# Patient Record
Sex: Male | Born: 1935 | Race: White | Hispanic: No | Marital: Married | Smoking: Never smoker
Health system: Southern US, Community
[De-identification: ages and names within clinical notes are randomized; demographics above are authoritative.]

## PROBLEM LIST (undated history)

## (undated) DIAGNOSIS — N400 Enlarged prostate without lower urinary tract symptoms: Secondary | ICD-10-CM

## (undated) DIAGNOSIS — F419 Anxiety disorder, unspecified: Secondary | ICD-10-CM

## (undated) DIAGNOSIS — M1990 Unspecified osteoarthritis, unspecified site: Secondary | ICD-10-CM

## (undated) DIAGNOSIS — H9110 Presbycusis, unspecified ear: Secondary | ICD-10-CM

## (undated) DIAGNOSIS — F0390 Unspecified dementia without behavioral disturbance: Secondary | ICD-10-CM

## (undated) DIAGNOSIS — O2230 Deep phlebothrombosis in pregnancy, unspecified trimester: Secondary | ICD-10-CM

## (undated) HISTORY — PX: PROSTATE SURGERY: SHX751

## (undated) HISTORY — DX: Unspecified osteoarthritis, unspecified site: M19.90

## (undated) HISTORY — DX: Anxiety disorder, unspecified: F41.9

## (undated) HISTORY — PX: EYE SURGERY: SHX253

## (undated) HISTORY — DX: Deep phlebothrombosis in pregnancy, unspecified trimester: O22.30

## (undated) HISTORY — PX: HERNIA REPAIR: SHX51

## (undated) HISTORY — DX: Benign prostatic hyperplasia without lower urinary tract symptoms: N40.0

## (undated) HISTORY — DX: Presbycusis, unspecified ear: H91.10

---
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
## 2001-05-07 LAB — HM COLONOSCOPY: HM Colonoscopy: NORMAL

## 2004-02-22 ENCOUNTER — Inpatient Hospital Stay (HOSPITAL_COMMUNITY): Admission: RE | Admit: 2004-02-22 | Discharge: 2004-02-26 | Payer: Self-pay | Admitting: Orthopedic Surgery

## 2005-04-16 HISTORY — PX: TOTAL HIP ARTHROPLASTY: SHX124

## 2008-01-29 ENCOUNTER — Ambulatory Visit: Payer: Self-pay | Admitting: Ophthalmology

## 2008-02-10 ENCOUNTER — Ambulatory Visit: Payer: Self-pay | Admitting: Ophthalmology

## 2008-03-16 ENCOUNTER — Ambulatory Visit: Payer: Self-pay

## 2010-12-14 ENCOUNTER — Ambulatory Visit: Payer: Self-pay | Admitting: Surgery

## 2010-12-22 ENCOUNTER — Ambulatory Visit: Payer: Self-pay | Admitting: Surgery

## 2012-05-07 ENCOUNTER — Encounter: Payer: Self-pay | Admitting: Internal Medicine

## 2012-05-07 ENCOUNTER — Ambulatory Visit (INDEPENDENT_AMBULATORY_CARE_PROVIDER_SITE_OTHER): Payer: Medicare Other | Admitting: Internal Medicine

## 2012-05-07 VITALS — BP 120/78 | HR 67 | Temp 98.2°F | Resp 16 | Ht 68.5 in | Wt 192.8 lb

## 2012-05-07 DIAGNOSIS — R5383 Other fatigue: Secondary | ICD-10-CM

## 2012-05-07 DIAGNOSIS — K219 Gastro-esophageal reflux disease without esophagitis: Secondary | ICD-10-CM

## 2012-05-07 DIAGNOSIS — R635 Abnormal weight gain: Secondary | ICD-10-CM

## 2012-05-07 DIAGNOSIS — H9110 Presbycusis, unspecified ear: Secondary | ICD-10-CM

## 2012-05-07 DIAGNOSIS — N400 Enlarged prostate without lower urinary tract symptoms: Secondary | ICD-10-CM

## 2012-05-07 DIAGNOSIS — Z13220 Encounter for screening for lipoid disorders: Secondary | ICD-10-CM

## 2012-05-07 LAB — CBC WITH DIFFERENTIAL/PLATELET
Basophils Absolute: 0 10*3/uL (ref 0.0–0.1)
Basophils Relative: 0.4 % (ref 0.0–3.0)
Eosinophils Absolute: 0.2 10*3/uL (ref 0.0–0.7)
Eosinophils Relative: 2 % (ref 0.0–5.0)
HCT: 45.4 % (ref 39.0–52.0)
Hemoglobin: 15.2 g/dL (ref 13.0–17.0)
Lymphocytes Relative: 32 % (ref 12.0–46.0)
Lymphs Abs: 2.7 10*3/uL (ref 0.7–4.0)
MCHC: 33.5 g/dL (ref 30.0–36.0)
MCV: 87 fl (ref 78.0–100.0)
Monocytes Absolute: 0.6 10*3/uL (ref 0.1–1.0)
Monocytes Relative: 6.9 % (ref 3.0–12.0)
Neutro Abs: 4.9 10*3/uL (ref 1.4–7.7)
Neutrophils Relative %: 58.7 % (ref 43.0–77.0)
Platelets: 259 10*3/uL (ref 150.0–400.0)
RBC: 5.22 Mil/uL (ref 4.22–5.81)
RDW: 13.2 % (ref 11.5–14.6)
WBC: 8.4 10*3/uL (ref 4.5–10.5)

## 2012-05-07 LAB — COMPREHENSIVE METABOLIC PANEL
ALT: 17 U/L (ref 0–53)
AST: 22 U/L (ref 0–37)
Albumin: 4.1 g/dL (ref 3.5–5.2)
Alkaline Phosphatase: 63 U/L (ref 39–117)
BUN: 16 mg/dL (ref 6–23)
CO2: 26 mEq/L (ref 19–32)
Calcium: 9.3 mg/dL (ref 8.4–10.5)
Chloride: 106 mEq/L (ref 96–112)
Creatinine, Ser: 1 mg/dL (ref 0.4–1.5)
GFR: 78.57 mL/min (ref >60.00)
Glucose, Bld: 96 mg/dL (ref 70–99)
Potassium: 4.1 mEq/L (ref 3.5–5.1)
Sodium: 140 mEq/L (ref 135–145)
Total Bilirubin: 1.3 mg/dL — ABNORMAL HIGH (ref 0.3–1.2)
Total Protein: 7.7 g/dL (ref 6.0–8.3)

## 2012-05-07 LAB — TSH: TSH: 2.16 u[IU]/mL (ref 0.35–5.50)

## 2012-05-07 MED ORDER — TRAMADOL HCL 50 MG PO TABS: 50.0000 mg | ORAL_TABLET | Freq: Four times a day (QID) | ORAL | Status: DC | PRN

## 2012-05-07 NOTE — Patient Instructions (Addendum)
We will send you your labs results via MyChart   Return in 6 months

## 2012-05-07 NOTE — Progress Notes (Signed)
Patient ID: Corey Todd, male   DOB: 17-Jan-1928, 79 y.o.   MRN: [id number]  Patient Active Problem List  Diagnosis  . Presbyacusis  . BPH (benign prostatic hypertrophy)  . GERD (gastroesophageal reflux disease)  . Screening for hyperlipidemia  . Weight gain    Subjective:  CC:   Chief Complaint  Patient presents with  . Establish Care    HPI:   Corey Todd is a 79 y.o. male who presents as a new patient to establish primary care with the chief complaint of need for primary care. Referred by wife.  He feels generally well and is pain free most of the time .  Enlarged prostate,  No trouble voiding if he stays on the flomax and cardura. Prior prostate biopsy was done for suspicion of CA but eventually reported as normal.  He believes it was healed by God.  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  Still very active in the prayer ministry by the [facility] .  Used to walk 2 miles daily at [facility] but has not done so lately.   Dyspepsia controlled with zantac and behavior modification .  Corey Todd does the cooking and has a careful menu.    Weight is stable,  gained a few lbs over Christmas,  No history  Of hyperlipidemia or  diabetes  Still getting annual PSAs by his PCP Artis Flock [place] Schillings despite age,  Due to BPH and priro threat of prostate CA    Some daytime somnolence but only with passive acitivities.  Does not snore   Chronic constipation,  Uses miralax every 3 days and moves bowels daily .  No blood.  Last colonoscopy over 10 years ago and normal   Chronic hearing loss, wears hearing aid in right ear . Bilateral cataracts surgery 2013,  Annual eye exams by Porfilio  History of lump on left calf treated with abx by henderson tuned out to be varicose vein /phlebitis   Left hand hematoma unprovoked dorsum  Resolved   spontaneosuly    Past Medical History  Diagnosis Date  . Presbyacusis     right sided hearing aid  . BPH (benign prostatic hypertrophy)     Past Surgical History  Procedure Date  . Prostate surgery     biopsy   . Joint replacement 1953    ankle crush injury, right  . Tonsillectomy and adenoidectomy 1936  . Total hip arthroplasty 2007    left , Dr Juliene Pina  . Eye surgery     cataract surgery    Family History  Problem Relation Age of Onset  . Arthritis Mother     History   Social History  . Marital Status: Unknown    Spouse Name: N/A    Number of Children: N/A  . Years of Education: N/A   Occupational History  . Not on file.   Social History Main Topics  . Smoking status: Former Smoker    Quit date: 04/16/1954  . Smokeless tobacco: Not on file  . Alcohol Use: No  . Drug Use: No  . Sexually Active: Not on file   Other Topics Concern  . Not on file   Social History Narrative  . No narrative on file         @ALLHX @    Review of Systems:  Patient denies headache, fevers, malaise, unintentional weight loss, skin rash, eye pain, sinus congestion and sinus pain, sore throat, dysphagia,  hemoptysis , cough, dyspnea, wheezing, chest pain, palpitations, orthopnea, edema, abdominal pain, nausea, melena, diarrhea, constipation, flank pain, dysuria, hematuria, urinary  Frequency, nocturia, numbness, tingling, seizures,  Focal weakness, Loss of consciousness,  Tremor, insomnia, depression, anxiety, and suicidal ideation.     Objective:  BP 120/78  Pulse 67  Temp 98.2 F (36.8 C) (Oral)  Resp 16  Ht 5' 8.5" (1.74 m)  Wt 192 lb 12 oz (87.431 kg)  BMI 28.88 kg/m2  SpO2 93%  General appearance: alert, cooperative and appears stated age Ears: normal TM's and external ear canals both ears Throat: lips, mucosa, and tongue normal; teeth and gums normal Neck: no adenopathy, no carotid bruit, supple, symmetrical, trachea midline and thyroid not enlarged,  symmetric, no tenderness/mass/nodules Back: symmetric, no curvature. ROM normal. No CVA tenderness. Lungs: clear to auscultation bilaterally Heart: regular rate and rhythm, S1, S2 normal, no murmur, click, rub or gallop Abdomen: soft, non-tender; bowel sounds normal; no masses,  no organomegaly Pulses: 2+ and symmetric Skin: Skin color, texture, turgor normal. No rashes or lesions Lymph nodes: Cervical, supraclavicular, and axillary nodes normal.  Assessment and Plan:  BPH (benign prostatic hypertrophy) Patient requests annual PSAs.  Will request records from prior PCP .  Renal function is normal.   GERD (gastroesophageal reflux disease) Controlled with diet, zantac  and behavioral modification   Weight gain I have addressed  BMI and recommended a low glycemic index diet utilizing smaller more frequent meals to increase metabolism.  I have also recommended that patient start exercising with a goal of 30 minutes of aerobic exercise a minimum of 5 days per week. Screening for thyroid and diabetes was negative today  Screening for hyperlipidemia Will check fasting lipids prior to annual exam.    Updated Medication List Outpatient Encounter Prescriptions as of 05/07/2012  Medication Sig Dispense Refill  . aspirin 81 MG tablet Take 81 mg by mouth daily.      . Cobalamine Combinations (B-12) [phone] MCG SUBL Place 1 tablet under the tongue daily.      Marland Kitchen doxazosin (CARDURA) 2 MG tablet Take 2 mg by mouth at bedtime.       . finasteride (PROSCAR) 5 MG tablet Take 5 mg by mouth daily.       . Magnesium 250 MG TABS Take 1 tablet by mouth daily.      . Multiple Vitamin (MULTIVITAMIN) tablet Take 1 tablet by mouth daily.      . polyethylene glycol powder (MIRALAX) powder Take 17 g by mouth every 3 (three) days.      . ranitidine (ZANTAC) 150 MG tablet Take 150 mg by mouth 2 (two) times daily as needed.      . Royal Jelly 500 MG CAPS Take 1 capsule by mouth daily.      . traMADol (ULTRAM) 50  MG tablet Take 1 tablet (50 mg total) by mouth every 6 (six) hours as needed.  60 tablet  2  . [DISCONTINUED] traMADol (ULTRAM) 50 MG tablet Take 50 mg by mouth every 6 (six) hours as needed.         Orders Placed This Encounter  Procedures  . CBC with Differential  . Comprehensive metabolic panel  . TSH  . HM COLONOSCOPY    No Follow-up on file.

## 2012-05-09 ENCOUNTER — Encounter: Payer: Self-pay | Admitting: Internal Medicine

## 2012-05-09 ENCOUNTER — Encounter: Payer: Self-pay | Admitting: General Practice

## 2012-05-09 DIAGNOSIS — Z13220 Encounter for screening for lipoid disorders: Secondary | ICD-10-CM | POA: Insufficient documentation

## 2012-05-09 DIAGNOSIS — E669 Obesity, unspecified: Secondary | ICD-10-CM | POA: Insufficient documentation

## 2012-05-09 DIAGNOSIS — K219 Gastro-esophageal reflux disease without esophagitis: Secondary | ICD-10-CM | POA: Insufficient documentation

## 2012-05-09 DIAGNOSIS — H9110 Presbycusis, unspecified ear: Secondary | ICD-10-CM | POA: Insufficient documentation

## 2012-05-09 DIAGNOSIS — N400 Enlarged prostate without lower urinary tract symptoms: Secondary | ICD-10-CM | POA: Insufficient documentation

## 2012-05-09 NOTE — Assessment & Plan Note (Signed)
I have addressed  BMI and recommended a low glycemic index diet utilizing smaller more frequent meals to increase metabolism.  I have also recommended that patient start exercising with a goal of 30 minutes of aerobic exercise a minimum of 5 days per week. Screening for thyroid and diabetes was negative today

## 2012-05-09 NOTE — Assessment & Plan Note (Addendum)
Controlled with diet, zantac  and behavioral modification

## 2012-05-09 NOTE — Assessment & Plan Note (Addendum)
Patient requests annual PSAs.  Will request records from prior PCP .  Renal function is normal.

## 2012-05-09 NOTE — Assessment & Plan Note (Signed)
Will check fasting lipids prior to annual exam.

## 2012-05-31 ENCOUNTER — Other Ambulatory Visit: Payer: Self-pay

## 2012-10-16 ENCOUNTER — Other Ambulatory Visit: Payer: Self-pay | Admitting: Internal Medicine

## 2012-11-18 ENCOUNTER — Ambulatory Visit (INDEPENDENT_AMBULATORY_CARE_PROVIDER_SITE_OTHER): Payer: Medicare Other | Admitting: Internal Medicine

## 2012-11-18 ENCOUNTER — Encounter: Payer: Self-pay | Admitting: Internal Medicine

## 2012-11-18 VITALS — BP 118/68 | HR 91 | Temp 98.6°F | Resp 14 | Ht 68.0 in | Wt 196.8 lb

## 2012-11-18 DIAGNOSIS — Z0000 Encounter for general adult medical examination without abnormal findings: Secondary | ICD-10-CM | POA: Insufficient documentation

## 2012-11-18 DIAGNOSIS — N400 Enlarged prostate without lower urinary tract symptoms: Secondary | ICD-10-CM

## 2012-11-18 DIAGNOSIS — E559 Vitamin D deficiency, unspecified: Secondary | ICD-10-CM

## 2012-11-18 DIAGNOSIS — E785 Hyperlipidemia, unspecified: Secondary | ICD-10-CM

## 2012-11-18 DIAGNOSIS — R635 Abnormal weight gain: Secondary | ICD-10-CM

## 2012-11-18 DIAGNOSIS — Z79899 Other long term (current) drug therapy: Secondary | ICD-10-CM

## 2012-11-18 DIAGNOSIS — Z13220 Encounter for screening for lipoid disorders: Secondary | ICD-10-CM

## 2012-11-18 MED ORDER — TRAMADOL HCL 50 MG PO TABS: 50.0000 mg | ORAL_TABLET | Freq: Four times a day (QID) | ORAL | Status: DC | PRN

## 2012-11-18 MED ORDER — DOXAZOSIN MESYLATE 2 MG PO TABS: 2.0000 mg | ORAL_TABLET | Freq: Every day | ORAL | Status: DC

## 2012-11-18 MED ORDER — TETANUS-DIPHTH-ACELL PERTUSSIS 5-2.5-18.5 LF-MCG/0.5 IM SUSP: 0.5000 mL | Freq: Once | INTRAMUSCULAR | Status: DC

## 2012-11-18 MED ORDER — FINASTERIDE 5 MG PO TABS: ORAL_TABLET | ORAL | Status: DC

## 2012-11-18 NOTE — Assessment & Plan Note (Signed)
I have addressed  BMI and recommended a low glycemic index diet utilizing smaller more frequent meals to increase metabolism.  I have also recommended that patient start exercising with a goal of 30 minutes of aerobic exercise a minimum of 5 days per week.  

## 2012-11-18 NOTE — Assessment & Plan Note (Signed)
Annual male exam was done excluding testicular and prostate exam. Colon ca screening was reviewed and options given.   

## 2012-11-18 NOTE — Assessment & Plan Note (Signed)
Symptoms are Well controlled on current regimen. Renal function stable, no changes today.

## 2012-11-18 NOTE — Assessment & Plan Note (Signed)
He will return for fasting lipids.  

## 2012-11-18 NOTE — Progress Notes (Signed)
Patient ID: Corey Todd, male   DOB: June 20, 1927, 79 y.o.   MRN: [id number]    Wellness exam.  Last eye exam was < 1 year by porfilio,  Had both cataracts removed 4 or 5 years ago.  deosnt want colonoscopyn anymore.  No new complaints. baldder working fine as long as he takes his prostate medications.   The patient is here for annual Medicare wellness examination and management of other chronic and acute problems.   The risk factors are reflected in the social history.  The roster of all physicians providing medical care to patient - is listed in the Snapshot section of the chart.  Activities of daily living:  The patient is 100% independent in all ADLs: dressing, toileting, feeding as well as independent mobility  Home safety : The patient has smoke detectors in the home. They wear seatbelts.  There are no firearms at home. There is no violence in the home.   There is no risks for hepatitis, STDs or HIV. There is no   history of blood transfusion. They have no travel history to infectious disease endemic areas of the world.  The patient has seen their dentist in the last six month. They have seen their eye doctor in the last year. They admit to slight hearing difficulty with regard to whispered voices and some television programs.  They have deferred audiologic testing in the last year.  They do not  have excessive sun exposure. Discussed the need for sun protection: hats, long sleeves and use of sunscreen if there is significant sun exposure.   Diet: the importance of a healthy diet is discussed. They do have a healthy diet.  The benefits of regular aerobic exercise were discussed. She walks 4 times per week ,  20 minutes.   Depression screen: there are no signs or vegative symptoms of depression- irritability, change in appetite, anhedonia, sadness/tearfullness.  Cognitive assessment: the patient manages all their financial and personal affairs and is actively engaged. They could relate  day,date,year and events; recalled 2/3 objects at 3 minutes; performed clock-face test normally.  The following portions of the patient's history were reviewed and updated as appropriate: allergies, current medications, past family history, past medical history,  past surgical history, past social history  and problem list.  Visual acuity was not assessed per patient preference since she has regular follow up with her ophthalmologist. Hearing and body mass index were assessed and reviewed.   During the course of the visit the patient was educated and counseled about appropriate screening and preventive services including : fall prevention , diabetes screening, nutrition counseling, colorectal cancer screening, and recommended immunizations.     Objective:  BP 118/68  Pulse 91  Temp(Src) 98.6 F (37 C) (Oral)  Resp 14  Ht 5\' 8"  (1.727 m)  Wt 196 lb 12 oz (89.245 kg)  BMI 29.92 kg/m2  SpO2 98%  BP 118/68  Pulse 91  Temp(Src) 98.6 F (37 C) (Oral)  Resp 14  Ht 5\' 8"  (1.727 m)  Wt 196 lb 12 oz (89.245 kg)  BMI 29.92 kg/m2  SpO2 98%  General Appearance:    Alert, cooperative, no distress, appears stated age  Head:    Normocephalic, without obvious abnormality, atraumatic  Eyes:    PERRL, conjunctiva/corneas clear, EOM's intact, fundi    benign, both eyes       Ears:    Normal TM's and external ear canals, both ears  Nose:   Nares normal, septum midline, mucosa  normal, no drainage   or sinus tenderness  Throat:   Lips, mucosa, and tongue normal; teeth and gums normal  Neck:   Supple, symmetrical, trachea midline, no adenopathy;       thyroid:  No enlargement/tenderness/nodules; no carotid   bruit or JVD  Back:     Symmetric, no curvature, ROM normal, no CVA tenderness  Lungs:     Clear to auscultation bilaterally, respirations unlabored  Chest wall:    No tenderness or deformity  Heart:    Regular rate and rhythm, S1 and S2 normal, no murmur, rub   or gallop  Abdomen:      Soft, non-tender, bowel sounds active all four quadrants,    no masses, no organomegaly     Extremities:   Extremities normal, atraumatic, no cyanosis or edema  Pulses:   2+ and symmetric all extremities  Skin:   Skin color, texture, turgor normal, no rashes or lesions  Lymph nodes:   Cervical, supraclavicular, and axillary nodes normal  Neurologic:   CNII-XII intact. Normal strength, sensation and reflexes      throughout   Assessment and Plan:  Routine general medical examination at a health care facility Annual male exam was done excluding testicular and prostate exam.  Colon ca screening was reviewed and options given.    BPH (benign prostatic hypertrophy) Symptoms are Well controlled on current regimen. Renal function stable, no changes today.  Screening for hyperlipidemia He will return for fasting lipids  Weight gain I have addressed  BMI and recommended a low glycemic index diet utilizing smaller more frequent meals to increase metabolism.  I have also recommended that patient start exercising with a goal of 30 minutes of aerobic exercise a minimum of 5 days per week.    Updated Medication List Outpatient Encounter Prescriptions as of 11/18/2012  Medication Sig Dispense Refill  . aspirin 81 MG tablet Take 81 mg by mouth daily.      . Cobalamine Combinations (B-12) [phone] MCG SUBL Place 1 tablet under the tongue daily.      Marland Kitchen doxazosin (CARDURA) 2 MG tablet Take 1 tablet (2 mg total) by mouth at bedtime.  90 tablet  1  . finasteride (PROSCAR) 5 MG tablet TAKE 1 TABLET EVERY DAY  90 tablet  1  . Magnesium 250 MG TABS Take 1 tablet by mouth daily.      . Multiple Vitamin (MULTIVITAMIN) tablet Take 1 tablet by mouth daily.      . polyethylene glycol powder (MIRALAX) powder Take 17 g by mouth every 3 (three) days.      . ranitidine (ZANTAC) 150 MG tablet Take 150 mg by mouth 2 (two) times daily as needed.      . traMADol (ULTRAM) 50 MG tablet Take 1 tablet (50 mg total) by  mouth every 6 (six) hours as needed.  90 tablet  3  . [DISCONTINUED] doxazosin (CARDURA) 2 MG tablet Take 2 mg by mouth at bedtime.       . [DISCONTINUED] finasteride (PROSCAR) 5 MG tablet TAKE 1 TABLET EVERY DAY  90 tablet  1  . [DISCONTINUED] traMADol (ULTRAM) 50 MG tablet Take 1 tablet (50 mg total) by mouth every 6 (six) hours as needed.  60 tablet  2  . Royal Jelly 500 MG CAPS Take 1 capsule by mouth daily.      . TDaP (BOOSTRIX) 5-2.5-18.5 LF-MCG/0.5 injection Inject 0.5 mLs into the muscle once.  0.5 mL  0   No facility-administered encounter  medications on file as of 11/18/2012.

## 2012-11-18 NOTE — Patient Instructions (Signed)
You had your annual Medicare wellness exam today  Please use the stool kit as your annual colon CA screening test.   You need to have a TDaP vaccine to protect you from tetanus,  Diptheria and whooping cough.  I have given you prescriptions for this because Medicare will not reimburse for them.  Most local pharmacies will have this available .   Please make an appt with the front desk for a fasting lab visit ; We will contact you with the bloodwork results

## 2012-11-19 ENCOUNTER — Other Ambulatory Visit: Payer: Self-pay

## 2012-11-21 ENCOUNTER — Other Ambulatory Visit (INDEPENDENT_AMBULATORY_CARE_PROVIDER_SITE_OTHER): Payer: Medicare Other

## 2012-11-21 DIAGNOSIS — E785 Hyperlipidemia, unspecified: Secondary | ICD-10-CM

## 2012-11-21 DIAGNOSIS — Z79899 Other long term (current) drug therapy: Secondary | ICD-10-CM

## 2012-11-21 DIAGNOSIS — E559 Vitamin D deficiency, unspecified: Secondary | ICD-10-CM

## 2012-11-21 LAB — COMPREHENSIVE METABOLIC PANEL
ALT: 15 U/L (ref 0–53)
AST: 22 U/L (ref 0–37)
Albumin: 3.9 g/dL (ref 3.5–5.2)
Alkaline Phosphatase: 62 U/L (ref 39–117)
BUN: 14 mg/dL (ref 6–23)
CO2: 28 mEq/L (ref 19–32)
Calcium: 9.3 mg/dL (ref 8.4–10.5)
Chloride: 105 mEq/L (ref 96–112)
Creatinine, Ser: 1.1 mg/dL (ref 0.4–1.5)
GFR: 70.83 mL/min (ref >60.00)
Glucose, Bld: 85 mg/dL (ref 70–99)
Potassium: 4.5 mEq/L (ref 3.5–5.1)
Sodium: 140 mEq/L (ref 135–145)
Total Bilirubin: 1.3 mg/dL — ABNORMAL HIGH (ref 0.3–1.2)
Total Protein: 6.9 g/dL (ref 6.0–8.3)

## 2012-11-21 LAB — LIPID PANEL
Cholesterol: 146 mg/dL (ref 0–200)
HDL: 48.8 mg/dL (ref >39.00)
LDL Cholesterol: 76 mg/dL (ref 0–99)
Total CHOL/HDL Ratio: 3
Triglycerides: 108 mg/dL (ref 0.0–149.0)
VLDL: 21.6 mg/dL (ref 0.0–40.0)

## 2012-11-22 ENCOUNTER — Encounter: Payer: Self-pay | Admitting: Internal Medicine

## 2012-11-22 LAB — VITAMIN D 25 HYDROXY (VIT D DEFICIENCY, FRACTURES): Vit D, 25-Hydroxy: 43 ng/mL (ref 30–89)

## 2012-11-26 ENCOUNTER — Other Ambulatory Visit (INDEPENDENT_AMBULATORY_CARE_PROVIDER_SITE_OTHER): Payer: Medicare Other

## 2012-11-26 DIAGNOSIS — Z1211 Encounter for screening for malignant neoplasm of colon: Secondary | ICD-10-CM

## 2012-11-27 ENCOUNTER — Other Ambulatory Visit: Payer: Self-pay | Admitting: *Deleted

## 2012-11-27 DIAGNOSIS — Z1211 Encounter for screening for malignant neoplasm of colon: Secondary | ICD-10-CM

## 2012-11-27 LAB — FECAL OCCULT BLOOD, IMMUNOCHEMICAL: Fecal Occult Bld: NEGATIVE

## 2012-11-28 ENCOUNTER — Encounter: Payer: Self-pay | Admitting: Internal Medicine

## 2013-02-04 ENCOUNTER — Other Ambulatory Visit: Payer: Self-pay | Admitting: Internal Medicine

## 2013-02-04 NOTE — Telephone Encounter (Signed)
Okay to refill? 

## 2013-02-19 ENCOUNTER — Other Ambulatory Visit: Payer: Self-pay

## 2013-06-05 ENCOUNTER — Other Ambulatory Visit: Payer: Self-pay | Admitting: Internal Medicine

## 2013-06-06 ENCOUNTER — Other Ambulatory Visit: Payer: Self-pay | Admitting: Internal Medicine

## 2013-09-02 ENCOUNTER — Other Ambulatory Visit: Payer: Self-pay | Admitting: Internal Medicine

## 2013-10-08 ENCOUNTER — Other Ambulatory Visit: Payer: Self-pay | Admitting: Internal Medicine

## 2013-10-14 ENCOUNTER — Other Ambulatory Visit: Payer: Self-pay | Admitting: Internal Medicine

## 2013-12-07 ENCOUNTER — Encounter: Payer: Self-pay | Admitting: Internal Medicine

## 2013-12-07 ENCOUNTER — Ambulatory Visit (INDEPENDENT_AMBULATORY_CARE_PROVIDER_SITE_OTHER): Payer: Medicare Other | Admitting: Internal Medicine

## 2013-12-07 VITALS — BP 122/60 | HR 78 | Temp 98.0°F | Resp 16 | Ht 67.75 in | Wt 196.2 lb

## 2013-12-07 DIAGNOSIS — E559 Vitamin D deficiency, unspecified: Secondary | ICD-10-CM

## 2013-12-07 DIAGNOSIS — R635 Abnormal weight gain: Secondary | ICD-10-CM

## 2013-12-07 DIAGNOSIS — R29818 Other symptoms and signs involving the nervous system: Secondary | ICD-10-CM

## 2013-12-07 DIAGNOSIS — N400 Enlarged prostate without lower urinary tract symptoms: Secondary | ICD-10-CM

## 2013-12-07 DIAGNOSIS — Z13220 Encounter for screening for lipoid disorders: Secondary | ICD-10-CM

## 2013-12-07 DIAGNOSIS — H9111 Presbycusis, right ear: Secondary | ICD-10-CM

## 2013-12-07 DIAGNOSIS — Z23 Encounter for immunization: Secondary | ICD-10-CM

## 2013-12-07 DIAGNOSIS — Z0000 Encounter for general adult medical examination without abnormal findings: Secondary | ICD-10-CM

## 2013-12-07 DIAGNOSIS — R2689 Other abnormalities of gait and mobility: Secondary | ICD-10-CM

## 2013-12-07 DIAGNOSIS — H9110 Presbycusis, unspecified ear: Secondary | ICD-10-CM

## 2013-12-07 LAB — COMPREHENSIVE METABOLIC PANEL
ALT: 16 U/L (ref 0–53)
AST: 18 U/L (ref 0–37)
Albumin: 3.7 g/dL (ref 3.5–5.2)
Alkaline Phosphatase: 59 U/L (ref 39–117)
BUN: 13 mg/dL (ref 6–23)
CO2: 24 mEq/L (ref 19–32)
Calcium: 9.1 mg/dL (ref 8.4–10.5)
Chloride: 106 mEq/L (ref 96–112)
Creatinine, Ser: 1 mg/dL (ref 0.4–1.5)
GFR: 76.45 mL/min (ref >60.00)
Glucose, Bld: 80 mg/dL (ref 70–99)
Potassium: 4.3 mEq/L (ref 3.5–5.1)
Sodium: 140 mEq/L (ref 135–145)
Total Bilirubin: 1.1 mg/dL (ref 0.2–1.2)
Total Protein: 7 g/dL (ref 6.0–8.3)

## 2013-12-07 LAB — CBC WITH DIFFERENTIAL/PLATELET
BASOS ABS: 0 10*3/uL (ref 0.0–0.1)
Basophils Relative: 0.3 % (ref 0.0–3.0)
EOS ABS: 0.1 10*3/uL (ref 0.0–0.7)
Eosinophils Relative: 1.5 % (ref 0.0–5.0)
HEMATOCRIT: 42.8 % (ref 39.0–52.0)
Hemoglobin: 14.2 g/dL (ref 13.0–17.0)
LYMPHS ABS: 2.6 10*3/uL (ref 0.7–4.0)
Lymphocytes Relative: 27.4 % (ref 12.0–46.0)
MCHC: 33.2 g/dL (ref 30.0–36.0)
MCV: 86.6 fl (ref 78.0–100.0)
Monocytes Absolute: 0.7 10*3/uL (ref 0.1–1.0)
Monocytes Relative: 7.2 % (ref 3.0–12.0)
NEUTROS ABS: 6.1 10*3/uL (ref 1.4–7.7)
Neutrophils Relative %: 63.6 % (ref 43.0–77.0)
Platelets: 248 10*3/uL (ref 150.0–400.0)
RBC: 4.95 Mil/uL (ref 4.22–5.81)
RDW: 13.9 % (ref 11.5–15.5)
WBC: 9.6 10*3/uL (ref 4.0–10.5)

## 2013-12-07 LAB — VITAMIN D 25 HYDROXY (VIT D DEFICIENCY, FRACTURES): VITD: 30.56 ng/mL (ref 30.00–100.00)

## 2013-12-07 LAB — VITAMIN B12: Vitamin B-12: 1178 pg/mL — ABNORMAL HIGH (ref 211–911)

## 2013-12-07 LAB — TSH: TSH: 3.3 u[IU]/mL (ref 0.35–4.50)

## 2013-12-07 MED ORDER — TETANUS-DIPHTH-ACELL PERTUSSIS 5-2.5-18.5 LF-MCG/0.5 IM SUSP: 0.5000 mL | Freq: Once | INTRAMUSCULAR | Status: DC

## 2013-12-07 NOTE — Progress Notes (Signed)
Patient ID: Corey Todd, male   DOB: November 10, 1927, 80 y.o.   MRN: [id number]  The patient is here for annual Medicare wellness examination and management of other chronic and acute problems.  He is concerned about a percieved loss in memory, specifically having trouble remembering the names of people he has known for many years.  No history of getting lost or forgetting to take medications.    The risk factors are reflected in the social history.  The roster of all physicians providing medical care to patient - is listed in the Snapshot section of the chart.  Activities of daily living:  The patient is 100% independent in all ADLs: dressing, toileting, feeding as well as independent mobility  Home safety : The patient has smoke detectors in the home. They wear seatbelts.  There are no firearms at home. There is no violence in the home.   There is no risks for hepatitis, STDs or HIV. There is no   history of blood transfusion. They have no travel history to infectious disease endemic areas of the world.  The patient has seen their dentist in the last six month. They have seen their eye doctor in the last year. They admit to slight hearing difficulty with regard to whispered voices and some television programs.  They have deferred audiologic testing in the last year.  They do not  have excessive sun exposure. Discussed the need for sun protection: hats, long sleeves and use of sunscreen if there is significant sun exposure.   Diet: the importance of a healthy diet is discussed. They do have a healthy diet.  The benefits of regular aerobic exercise were discussed. She walks 4 times per week ,  20 minutes.   Depression screen: there are no signs or vegative symptoms of depression- irritability, change in appetite, anhedonia, sadness/tearfullness.  Cognitive assessment: the patient manages all their financial and personal affairs and is actively engaged. They could relate day,date,year and  events; recalled 2/3 objects at 3 minutes; performed clock-face test normally.  The following portions of the patient's history were reviewed and updated as appropriate: allergies, current medications, past family history, past medical history,  past surgical history, past social history  and problem list.  Visual acuity was not assessed per patient preference since she has regular follow up with her ophthalmologist. Hearing and body mass index were assessed and reviewed.   During the course of the visit the patient was educated and counseled about appropriate screening and preventive services including : fall prevention , diabetes screening, nutrition counseling, colorectal cancer screening, and recommended immunizations.    Objective: BP 122/60  Pulse 78  Temp(Src) 98 F (36.7 C) (Oral)  Resp 16  Ht 5' 7.75" (1.721 m)  Wt 196 lb 4 oz (89.018 kg)  BMI 30.05 kg/m2  SpO2 96%  General appearance: alert, cooperative and appears stated age Ears: normal TM's and external ear canals both ears Throat: lips, mucosa, and tongue normal; teeth and gums normal Neck: no adenopathy, no carotid bruit, supple, symmetrical, trachea midline and thyroid not enlarged, symmetric, no tenderness/mass/nodules Back: symmetric, no curvature. ROM normal. No CVA tenderness. Lungs: clear to auscultation bilaterally Heart: regular rate and rhythm, S1, S2 normal, no murmur, click, rub or gallop Abdomen: soft, non-tender; bowel sounds normal; no masses,  no organomegaly Pulses: 2+ and symmetric Skin: Skin color, texture, turgor normal. No rashes or lesions Lymph nodes: Cervical, supraclavicular, and axillary nodes normal. MMSE: 30/30 clock drawing intact as well Neuro: CNs  2-12 intact. DTRs 2+/4 in biceps, brachioradialis, patellars and achilles. Muscle strength 5/5 in upper and lower exremities. Fine resting tremor bilaterally both hands cerebellar function normal. Romberg negative.  No pronator drift.   Gait  normal.   Assessment and Plan:  Presbyacusis Patient normally wears a right sided hearing aid and only has issues with "mumblers"  BPH (benign prostatic hypertrophy) Symptoms are Well controlled on current regimen. Renal function stable, no changes today.   Lab Results  Component Value Date   CREATININE 1.0 12/07/2013      Obesity (BMI 30-39.9) I have addressed  BMI and recommended wt loss of 10% of body weight over the next 6 months using a low glycemic index diet and regular exercise a minimum of 5 days per week.    Loss of balance Neuro exam is normal today.  No falls in the past year   Encounter for Medicare annual wellness exam Annual Medicare wellness  exam was done as well as a comprehensive physical exam and management of acute and chronic conditions .  Health maintenance screenings have ben addressed and hand out given .   Screening for hyperlipidemia Fasting lipids from last year reviewed,  No indications for medications. Lab Results  Component Value Date   CHOL 146 11/21/2012   HDL 48.80 11/21/2012   LDLCALC 76 11/21/2012   TRIG 108.0 11/21/2012   CHOLHDL 3 11/21/2012       Updated Medication List Outpatient Encounter Prescriptions as of 12/07/2013  Medication Sig  . aspirin 81 MG tablet Take 81 mg by mouth daily.  Marland Kitchen doxazosin (CARDURA) 2 MG tablet TAKE 1 TABLET (2 MG TOTAL) BY MOUTH AT BEDTIME.  . finasteride (PROSCAR) 5 MG tablet TAKE 1 TABLET EVERY DAY  . Magnesium 250 MG TABS Take 1 tablet by mouth daily.  . Multiple Vitamin (MULTIVITAMIN) tablet Take 1 tablet by mouth daily.  . polyethylene glycol powder (MIRALAX) powder Take 17 g by mouth every 3 (three) days.  . ranitidine (ZANTAC) 150 MG tablet Take 150 mg by mouth 2 (two) times daily as needed.  . Royal Jelly 500 MG CAPS Take 1 capsule by mouth daily.  . TDaP (BOOSTRIX) 5-2.5-18.5 LF-MCG/0.5 injection Inject 0.5 mLs into the muscle once.  . Tdap (BOOSTRIX) 5-2.5-18.5 LF-MCG/0.5 injection Inject 0.5 mLs  into the muscle once.  . [DISCONTINUED] Cobalamine Combinations (B-12) [phone] MCG SUBL Place 1 tablet under the tongue daily.  . [DISCONTINUED] doxazosin (CARDURA) 2 MG tablet Take 1 tablet (2 mg total) by mouth at bedtime. **OVERDUE FOR APPT-PLEASE CALL OFFICE BEFORE RUNNING OUT OF MEDICATION**  . [DISCONTINUED] traMADol (ULTRAM) 50 MG tablet Take 1 tablet (50 mg total) by mouth every 6 (six) hours as needed.  . [DISCONTINUED] traMADol (ULTRAM) 50 MG tablet TAKE 1 TABLET BY MOUTH EVERY 6 HOURS AS NEEDED

## 2013-12-07 NOTE — Patient Instructions (Signed)
You had your annual Medicare wellness exam today  You need to have a TDaP vaccine   I have given you prescription because the vaccine will be cheaper at the health Dept   You received the new pneumonia vaccine today.  We will contact you with the bloodwork results  Your memory is fine!  Health Maintenance A healthy lifestyle and preventative care can promote health and wellness.  Maintain regular health, dental, and eye exams.  Eat a healthy diet. Foods like vegetables, fruits, whole grains, low-fat dairy products, and lean protein foods contain the nutrients you need and are low in calories. Decrease your intake of foods high in solid fats, added sugars, and salt. Get information about a proper diet from your health care provider, if necessary.  Regular physical exercise is one of the most important things you can do for your health. Most adults should get at least 150 minutes of moderate-intensity exercise (any activity that increases your heart rate and causes you to sweat) each week. In addition, most adults need muscle-strengthening exercises on 2 or more days a week.   Maintain a healthy weight. The body mass index (BMI) is a screening tool to identify possible weight problems. It provides an estimate of body fat based on height and weight. Your health care provider can find your BMI and can help you achieve or maintain a healthy weight. For males 20 years and older:  A BMI below 18.5 is considered underweight.  A BMI of 18.5 to 24.9 is normal.  A BMI of 25 to 29.9 is considered overweight.  A BMI of 30 and above is considered obese.  Maintain normal blood lipids and cholesterol by exercising and minimizing your intake of saturated fat. Eat a balanced diet with plenty of fruits and vegetables. Blood tests for lipids and cholesterol should begin at age 1 and be repeated every 5 years. If your lipid or cholesterol levels are high, you are over age 64, or you are at high risk for  heart disease, you may need your cholesterol levels checked more frequently.Ongoing high lipid and cholesterol levels should be treated with medicines if diet and exercise are not working.  If you smoke, find out from your health care provider how to quit. If you do not use tobacco, do not start.  Lung cancer screening is recommended for adults aged 61-80 years who are at high risk for developing lung cancer because of a history of smoking. A yearly low-dose CT scan of the lungs is recommended for people who have at least a 30-pack-year history of smoking and are current smokers or have quit within the past 15 years. A pack year of smoking is smoking an average of 1 pack of cigarettes a day for 1 year (for example, a 30-pack-year history of smoking could mean smoking 1 pack a day for 30 years or 2 packs a day for 15 years). Yearly screening should continue until the smoker has stopped smoking for at least 15 years. Yearly screening should be stopped for people who develop a health problem that would prevent them from having lung cancer treatment.  If you choose to drink alcohol, do not have more than 2 drinks per day. One drink is considered to be 12 oz (360 mL) of beer, 5 oz (150 mL) of wine, or 1.5 oz (45 mL) of liquor.  Avoid the use of street drugs. Do not share needles with anyone. Ask for help if you need support or instructions about stopping  the use of drugs.  High blood pressure causes heart disease and increases the risk of stroke. Blood pressure should be checked at least every 1-2 years. Ongoing high blood pressure should be treated with medicines if weight loss and exercise are not effective.  If you are 1-62 years old, ask your health care provider if you should take aspirin to prevent heart disease.  Diabetes screening involves taking a blood sample to check your fasting blood sugar level. This should be done once every 3 years after age 57 if you are at a normal weight and without  risk factors for diabetes. Testing should be considered at a younger age or be carried out more frequently if you are overweight and have at least 1 risk factor for diabetes.  Colorectal cancer can be detected and often prevented. Most routine colorectal cancer screening begins at the age of 47 and continues through age 45. However, your health care provider may recommend screening at an earlier age if you have risk factors for colon cancer. On a yearly basis, your health care provider may provide home test kits to check for hidden blood in the stool. A small camera at the end of a tube may be used to directly examine the colon (sigmoidoscopy or colonoscopy) to detect the earliest forms of colorectal cancer. Talk to your health care provider about this at age 53 when routine screening begins. A direct exam of the colon should be repeated every 5-10 years through age 20, unless early forms of precancerous polyps or small growths are found.  People who are at an increased risk for hepatitis B should be screened for this virus. You are considered at high risk for hepatitis B if:  You were born in a country where hepatitis B occurs often. Talk with your health care provider about which countries are considered high risk.  Your parents were born in a high-risk country and you have not received a shot to protect against hepatitis B (hepatitis B vaccine).  You have HIV or AIDS.  You use needles to inject street drugs.  You live with, or have sex with, someone who has hepatitis B.  You are a man who has sex with other men (MSM).  You get hemodialysis treatment.  You take certain medicines for conditions like cancer, organ transplantation, and autoimmune conditions.  Hepatitis C blood testing is recommended for all people born from [birth year] through 1963 and any individual with known risk factors for hepatitis C.  Healthy men should no longer receive prostate-specific antigen (PSA) blood tests as part of  routine cancer screening. Talk to your health care provider about prostate cancer screening.  Testicular cancer screening is not recommended for adolescents or adult males who have no symptoms. Screening includes self-exam, a health care provider exam, and other screening tests. Consult with your health care provider about any symptoms you have or any concerns you have about testicular cancer.  Practice safe sex. Use condoms and avoid high-risk sexual practices to reduce the spread of sexually transmitted infections (STIs).  You should be screened for STIs, including gonorrhea and chlamydia if:  You are sexually active and are younger than 24 years.  You are older than 24 years, and your health care provider tells you that you are at risk for this type of infection.  Your sexual activity has changed since you were last screened, and you are at an increased risk for chlamydia or gonorrhea. Ask your health care provider if you are at  risk.  If you are at risk of being infected with HIV, it is recommended that you take a prescription medicine daily to prevent HIV infection. This is called pre-exposure prophylaxis (PrEP). You are considered at risk if:  You are a man who has sex with other men (MSM).  You are a heterosexual man who is sexually active with multiple partners.  You take drugs by injection.  You are sexually active with a partner who has HIV.  Talk with your health care provider about whether you are at high risk of being infected with HIV. If you choose to begin PrEP, you should first be tested for HIV. You should then be tested every 3 months for as long as you are taking PrEP.  Use sunscreen. Apply sunscreen liberally and repeatedly throughout the day. You should seek shade when your shadow is shorter than you. Protect yourself by wearing long sleeves, pants, a wide-brimmed hat, and sunglasses year round whenever you are outdoors.  Tell your health care provider of new moles  or changes in moles, especially if there is a change in shape or color. Also, tell your health care provider if a mole is larger than the size of a pencil eraser.  A one-time screening for abdominal aortic aneurysm (AAA) and surgical repair of large AAAs by ultrasound is recommended for men aged 76-75 years who are current or former smokers.  Stay current with your vaccines (immunizations). Document Released: 09/29/2007 Document Revised: 04/07/2013 Document Reviewed: 08/28/2010 [facility] Patient Information 2015 [place], [place]. This information is not intended to replace advice given to you by your health care provider. Make sure you discuss any questions you have with your health care provider.

## 2013-12-07 NOTE — Progress Notes (Signed)
Pre-visit discussion using our clinic review tool. No additional management support is needed unless otherwise documented below in the visit note.  

## 2013-12-08 NOTE — Assessment & Plan Note (Signed)
Fasting lipids from last year reviewed,  No indications for medications. Lab Results  Component Value Date   CHOL 146 11/21/2012   HDL 48.80 11/21/2012   LDLCALC 76 11/21/2012   TRIG 108.0 11/21/2012   CHOLHDL 3 11/21/2012

## 2013-12-08 NOTE — Assessment & Plan Note (Signed)
Symptoms are Well controlled on current regimen. Renal function stable, no changes today.   Lab Results  Component Value Date   CREATININE 1.0 12/07/2013

## 2013-12-08 NOTE — Assessment & Plan Note (Signed)
Patient normally wears a right sided hearing aid and only has issues with "mumblers"

## 2013-12-08 NOTE — Assessment & Plan Note (Signed)
Annual Medicare wellness  exam was done as well as a comprehensive physical exam and management of acute and chronic conditions .  Health maintenance screenings have ben addressed and hand out given .

## 2013-12-08 NOTE — Assessment & Plan Note (Signed)
I have addressed  BMI and recommended wt loss of 10% of body weight over the next 6 months using a low glycemic index diet and regular exercise a minimum of 5 days per week.   

## 2013-12-08 NOTE — Assessment & Plan Note (Signed)
Neuro exam is normal today.  No falls in the past year

## 2013-12-09 ENCOUNTER — Other Ambulatory Visit: Payer: Self-pay | Admitting: Internal Medicine

## 2013-12-09 ENCOUNTER — Encounter: Payer: Self-pay | Admitting: Internal Medicine

## 2014-01-06 ENCOUNTER — Other Ambulatory Visit: Payer: Self-pay | Admitting: Internal Medicine

## 2014-03-10 ENCOUNTER — Other Ambulatory Visit: Payer: Self-pay | Admitting: Internal Medicine

## 2014-04-12 ENCOUNTER — Other Ambulatory Visit: Payer: Self-pay | Admitting: Internal Medicine

## 2014-06-15 ENCOUNTER — Other Ambulatory Visit: Payer: Self-pay | Admitting: Internal Medicine

## 2014-08-04 ENCOUNTER — Ambulatory Visit (INDEPENDENT_AMBULATORY_CARE_PROVIDER_SITE_OTHER): Payer: Medicare Other | Admitting: Internal Medicine

## 2014-08-04 ENCOUNTER — Encounter: Payer: Self-pay | Admitting: Internal Medicine

## 2014-08-04 VITALS — BP 140/62 | HR 75 | Temp 97.9°F | Resp 14 | Ht 67.5 in | Wt 194.0 lb

## 2014-08-04 DIAGNOSIS — R5383 Other fatigue: Secondary | ICD-10-CM

## 2014-08-04 DIAGNOSIS — N400 Enlarged prostate without lower urinary tract symptoms: Secondary | ICD-10-CM

## 2014-08-04 DIAGNOSIS — I10 Essential (primary) hypertension: Secondary | ICD-10-CM | POA: Diagnosis not present

## 2014-08-04 DIAGNOSIS — Z13220 Encounter for screening for lipoid disorders: Secondary | ICD-10-CM

## 2014-08-04 DIAGNOSIS — E669 Obesity, unspecified: Secondary | ICD-10-CM

## 2014-08-04 LAB — COMPREHENSIVE METABOLIC PANEL
ALT: 12 U/L (ref 0–53)
AST: 17 U/L (ref 0–37)
Albumin: 3.9 g/dL (ref 3.5–5.2)
Alkaline Phosphatase: 64 U/L (ref 39–117)
BUN: 17 mg/dL (ref 6–23)
CO2: 28 meq/L (ref 19–32)
Calcium: 9 mg/dL (ref 8.4–10.5)
Chloride: 107 mEq/L (ref 96–112)
Creatinine, Ser: 1.05 mg/dL (ref 0.40–1.50)
GFR: 71.32 mL/min (ref >60.00)
Glucose, Bld: 94 mg/dL (ref 70–99)
POTASSIUM: 4.5 meq/L (ref 3.5–5.1)
SODIUM: 139 meq/L (ref 135–145)
TOTAL PROTEIN: 7.1 g/dL (ref 6.0–8.3)
Total Bilirubin: 1 mg/dL (ref 0.2–1.2)

## 2014-08-04 NOTE — Progress Notes (Signed)
Patient ID: Corey Todd, male   DOB: [birth year], 81 y.o.   MRN: [id number]   Patient Active Problem List   Diagnosis Date Noted  . Essential hypertension 08/07/2014  . Loss of balance 12/07/2013  . Encounter for Medicare annual wellness exam 11/18/2012  . GERD (gastroesophageal reflux disease) 05/09/2012  . Screening for hyperlipidemia 05/09/2012  . Obesity (BMI 30-39.9) 05/09/2012  . Presbyacusis   . BPH (benign prostatic hypertrophy)     Subjective:  CC:   Chief Complaint  Patient presents with  . Follow-up    Hypotension Patient had  some low BP readings stopped Doxazosin and fenasteride    HPI:   Corey Todd is a 81 y.o. male who presents for   Medication problems.  Patient states that he had several days of feeling tired and unsteady on his feet accompanied by "low blood pressure readings". He brings a list of readings , all systolic readings are 443 to 154 , diastolics 60 to 75.  He stopped both his cardiura and his flomax .  Denies chest pain,  Dyspnea, dysuria, polyuria and nausea.  He feels somewhat better and has brought his home  BP machine in with him for calibration.  His readings are 20 pts lower than the office readings.   Past Medical History  Diagnosis Date  . Presbyacusis     right sided hearing aid  . BPH (benign prostatic hypertrophy)     Past Surgical History  Procedure Laterality Date  . Prostate surgery      biopsy   . Joint replacement  1953    ankle crush injury, right  . Tonsillectomy and adenoidectomy  1936  . Total hip arthroplasty  2007    left , Dr Cay Schillings  . Eye surgery      cataract surgery       The following portions of the patient's history were reviewed and updated as appropriate: Allergies, current medications, and problem list.    Review of Systems:   Patient denies headache, fevers, malaise, unintentional weight loss, skin rash, eye pain, sinus congestion and sinus pain, sore throat, dysphagia,  hemoptysis ,  cough, dyspnea, wheezing, chest pain, palpitations, orthopnea, edema, abdominal pain, nausea, melena, diarrhea, constipation, flank pain, dysuria, hematuria, urinary  Frequency, nocturia, numbness, tingling, seizures,  Focal weakness, Loss of consciousness,  Tremor, insomnia, depression, anxiety, and suicidal ideation.     History   Social History  . Marital Status: Unknown    Spouse Name: N/A  . Number of Children: N/A  . Years of Education: N/A   Occupational History  . Not on file.   Social History Main Topics  . Smoking status: Former Smoker    Quit date: 04/16/1954  . Smokeless tobacco: Not on file  . Alcohol Use: No  . Drug Use: No  . Sexual Activity: Not on file   Other Topics Concern  . Not on file   Social History Narrative    Objective:  Filed Vitals:   08/04/14 0811  BP: 140/62  Pulse: 75  Temp: 97.9 F (36.6 C)  Resp: 14     General appearance: alert, cooperative and appears stated age Ears: normal TM's and external ear canals both ears Throat: lips, mucosa, and tongue normal; teeth and gums normal Neck: no adenopathy, no carotid bruit, supple, symmetrical, trachea midline and thyroid not enlarged, symmetric, no tenderness/mass/nodules Back: symmetric, no curvature. ROM normal. No CVA tenderness. Lungs: clear to auscultation bilaterally Heart: regular rate and rhythm, S1,  S2 normal, no murmur, click, rub or gallop Abdomen: soft, non-tender; bowel sounds normal; no masses,  no organomegaly Pulses: 2+ and symmetric Skin: Skin color, texture, turgor normal. No rashes or lesions Lymph nodes: Cervical, supraclavicular, and axillary nodes normal. Neuro: CNs 2-12 intact. DTRs 2+/4 in biceps, brachioradialis, patellars and achilles. Muscle strength 5/5 in upper and lower exremities. Fine resting tremor bilaterally both hands cerebellar function normal. Romberg negative.  No pronator drift.   Gait normal.   Assessment and Plan:  Essential hypertension Mildly  elevated without cardura.  Will follow for now, resume flomax given history of BPH .  Home monitor reads 15 points low er than office measurements.   Lab Results  Component Value Date   CREATININE 1.05 08/04/2014   Lab Results  Component Value Date   NA 139 08/04/2014   K 4.5 08/04/2014   CL 107 08/04/2014   CO2 28 08/04/2014      BPH (benign prostatic hypertrophy) advsied to resume Flomax first.     Screening for hyperlipidemia Fasting lipids from last year reviewed,  No indications for medications. Lab Results  Component Value Date   CHOL 146 11/21/2012   HDL 48.80 11/21/2012   LDLCALC 76 11/21/2012   TRIG 108.0 11/21/2012   CHOLHDL 3 11/21/2012         Obesity (BMI 30-39.9) I have addressed  BMI and recommended a low glycemic index diet utilizing smaller more frequent meals to increase metabolism.  I have also recommended that patient start exercising with a goal of 30 minutes of aerobic exercise a minimum of 5 days per week. Screening for lipid disorders, thyroid and diabetes to be done today.    Wt Readings from Last 3 Encounters:  08/04/14 194 lb (87.998 kg)  12/07/13 196 lb 4 oz (89.018 kg)  11/18/12 196 lb 12 oz (89.245 kg)       Updated Medication List Outpatient Encounter Prescriptions as of 08/04/2014  Medication Sig  . aspirin 81 MG tablet Take 81 mg by mouth daily.  . Magnesium 250 MG TABS Take 1 tablet by mouth daily.  . Multiple Vitamin (MULTIVITAMIN) tablet Take 1 tablet by mouth daily.  . polyethylene glycol powder (MIRALAX) powder Take 17 g by mouth every 3 (three) days.  . ranitidine (ZANTAC) 150 MG tablet Take 150 mg by mouth 2 (two) times daily as needed.  . doxazosin (CARDURA) 2 MG tablet TAKE 1 TABLET BY MOUTH AT BEDTIME (Patient not taking: Reported on 08/04/2014)  . finasteride (PROSCAR) 5 MG tablet TAKE 1 TABLET BY MOUTH EVERY DAY (Patient not taking: Reported on 08/04/2014)  . Royal Jelly 500 MG CAPS Take 1 capsule by mouth daily.   . [DISCONTINUED] TDaP (BOOSTRIX) 5-2.5-18.5 LF-MCG/0.5 injection Inject 0.5 mLs into the muscle once. (Patient not taking: Reported on 08/04/2014)  . [DISCONTINUED] Tdap (BOOSTRIX) 5-2.5-18.5 LF-MCG/0.5 injection Inject 0.5 mLs into the muscle once.     Orders Placed This Encounter  Procedures  . Comp Met (CMET)    No Follow-up on file.

## 2014-08-04 NOTE — Progress Notes (Signed)
Pre-visit discussion using our clinic review tool. No additional management support is needed unless otherwise documented below in the visit note.  

## 2014-08-04 NOTE — Patient Instructions (Addendum)
Your home BP monitor is light by 15 pts,  So add 15 pts your your readings for accuracy  Resume the finasteride first  , and follow your symptoms and your blood pressure for two weeks'  If you feel fine but your BP is > 159,  Stop the finasteride and start the doxazosin   [email or web address] is the website to a [place] based company  That sells  all types of compression socks   Please start going for a daily walk .  You need it to keep your muscles toned

## 2014-08-05 ENCOUNTER — Encounter: Payer: Self-pay | Admitting: Internal Medicine

## 2014-08-07 DIAGNOSIS — I10 Essential (primary) hypertension: Secondary | ICD-10-CM | POA: Insufficient documentation

## 2014-08-07 NOTE — Assessment & Plan Note (Addendum)
Mildly elevated without cardura.  Will follow for now, resume flomax given history of BPH .  Home monitor reads 15 points low er than office measurements.   Lab Results  Component Value Date   CREATININE 1.05 08/04/2014   Lab Results  Component Value Date   NA 139 08/04/2014   K 4.5 08/04/2014   CL 107 08/04/2014   CO2 28 08/04/2014

## 2014-08-07 NOTE — Assessment & Plan Note (Signed)
Fasting lipids from last year reviewed,  No indications for medications. Lab Results  Component Value Date   CHOL 146 11/21/2012   HDL 48.80 11/21/2012   LDLCALC 76 11/21/2012   TRIG 108.0 11/21/2012   CHOLHDL 3 11/21/2012

## 2014-08-07 NOTE — Assessment & Plan Note (Signed)
I have addressed  BMI and recommended a low glycemic index diet utilizing smaller more frequent meals to increase metabolism.  I have also recommended that patient start exercising with a goal of 30 minutes of aerobic exercise a minimum of 5 days per week. Screening for lipid disorders, thyroid and diabetes to be done today.    Wt Readings from Last 3 Encounters:  08/04/14 194 lb (87.998 kg)  12/07/13 196 lb 4 oz (89.018 kg)  11/18/12 196 lb 12 oz (89.245 kg)

## 2014-08-07 NOTE — Assessment & Plan Note (Signed)
advsied to resume Flomax first.

## 2014-10-15 ENCOUNTER — Other Ambulatory Visit: Payer: Self-pay | Admitting: Internal Medicine

## 2014-12-24 ENCOUNTER — Ambulatory Visit (INDEPENDENT_AMBULATORY_CARE_PROVIDER_SITE_OTHER): Payer: Medicare Other | Admitting: Internal Medicine

## 2014-12-24 ENCOUNTER — Encounter: Payer: Self-pay | Admitting: Internal Medicine

## 2014-12-24 VITALS — BP 110/64 | HR 72 | Temp 98.1°F | Resp 12 | Ht 68.0 in | Wt 193.2 lb

## 2014-12-24 DIAGNOSIS — Z0000 Encounter for general adult medical examination without abnormal findings: Secondary | ICD-10-CM

## 2014-12-24 DIAGNOSIS — Z7189 Other specified counseling: Secondary | ICD-10-CM

## 2014-12-24 NOTE — Patient Instructions (Signed)
Please being me a copy of your Advance Directives  Please keep your Do Not Resuscitate Order displayed in a prominent location  Health Maintenance A healthy lifestyle and preventative care can promote health and wellness.  Maintain regular health, dental, and eye exams.  Eat a healthy diet. Foods like vegetables, fruits, whole grains, low-fat dairy products, and lean protein foods contain the nutrients you need and are low in calories. Decrease your intake of foods high in solid fats, added sugars, and salt. Get information about a proper diet from your health care provider, if necessary.  Regular physical exercise is one of the most important things you can do for your health. Most adults should get at least 150 minutes of moderate-intensity exercise (any activity that increases your heart rate and causes you to sweat) each week. In addition, most adults need muscle-strengthening exercises on 2 or more days a week.   Maintain a healthy weight. The body mass index (BMI) is a screening tool to identify possible weight problems. It provides an estimate of body fat based on height and weight. Your health care provider can find your BMI and can help you achieve or maintain a healthy weight. For males 20 years and older:  A BMI below 18.5 is considered underweight.  A BMI of 18.5 to 24.9 is normal.  A BMI of 25 to 29.9 is considered overweight.  A BMI of 30 and above is considered obese.  Maintain normal blood lipids and cholesterol by exercising and minimizing your intake of saturated fat. Eat a balanced diet with plenty of fruits and vegetables. Blood tests for lipids and cholesterol should begin at age 73 and be repeated every 5 years. If your lipid or cholesterol levels are high, you are over age 80, or you are at high risk for heart disease, you may need your cholesterol levels checked more frequently.Ongoing high lipid and cholesterol levels should be treated with medicines if diet and  exercise are not working.  If you smoke, find out from your health care provider how to quit. If you do not use tobacco, do not start.  Lung cancer screening is recommended for adults aged 57-80 years who are at high risk for developing lung cancer because of a history of smoking. A yearly low-dose CT scan of the lungs is recommended for people who have at least a 30-pack-year history of smoking and are current smokers or have quit within the past 15 years. A pack year of smoking is smoking an average of 1 pack of cigarettes a day for 1 year (for example, a 30-pack-year history of smoking could mean smoking 1 pack a day for 30 years or 2 packs a day for 15 years). Yearly screening should continue until the smoker has stopped smoking for at least 15 years. Yearly screening should be stopped for people who develop a health problem that would prevent them from having lung cancer treatment.  If you choose to drink alcohol, do not have more than 2 drinks per day. One drink is considered to be 12 oz (360 mL) of beer, 5 oz (150 mL) of wine, or 1.5 oz (45 mL) of liquor.  Avoid the use of street drugs. Do not share needles with anyone. Ask for help if you need support or instructions about stopping the use of drugs.  High blood pressure causes heart disease and increases the risk of stroke. Blood pressure should be checked at least every 1-2 years. Ongoing high blood pressure should be treated  with medicines if weight loss and exercise are not effective.  If you are 73-57 years old, ask your health care provider if you should take aspirin to prevent heart disease.  Diabetes screening involves taking a blood sample to check your fasting blood sugar level. This should be done once every 3 years after age 57 if you are at a normal weight and without risk factors for diabetes. Testing should be considered at a younger age or be carried out more frequently if you are overweight and have at least 1 risk factor for  diabetes.  Colorectal cancer can be detected and often prevented. Most routine colorectal cancer screening begins at the age of 90 and continues through age 1. However, your health care provider may recommend screening at an earlier age if you have risk factors for colon cancer. On a yearly basis, your health care provider may provide home test kits to check for hidden blood in the stool. A small camera at the end of a tube may be used to directly examine the colon (sigmoidoscopy or colonoscopy) to detect the earliest forms of colorectal cancer. Talk to your health care provider about this at age 40 when routine screening begins. A direct exam of the colon should be repeated every 5-10 years through age 20, unless early forms of precancerous polyps or small growths are found.  People who are at an increased risk for hepatitis B should be screened for this virus. You are considered at high risk for hepatitis B if:  You were born in a country where hepatitis B occurs often. Talk with your health care provider about which countries are considered high risk.  Your parents were born in a high-risk country and you have not received a shot to protect against hepatitis B (hepatitis B vaccine).  You have HIV or AIDS.  You use needles to inject street drugs.  You live with, or have sex with, someone who has hepatitis B.  You are a man who has sex with other men (MSM).  You get hemodialysis treatment.  You take certain medicines for conditions like cancer, organ transplantation, and autoimmune conditions.  Hepatitis C blood testing is recommended for all people born from [birth year] through 1963 and any individual with known risk factors for hepatitis C.  Healthy men should no longer receive prostate-specific antigen (PSA) blood tests as part of routine cancer screening. Talk to your health care provider about prostate cancer screening.  Testicular cancer screening is not recommended for adolescents or  adult males who have no symptoms. Screening includes self-exam, a health care provider exam, and other screening tests. Consult with your health care provider about any symptoms you have or any concerns you have about testicular cancer.  Practice safe sex. Use condoms and avoid high-risk sexual practices to reduce the spread of sexually transmitted infections (STIs).  You should be screened for STIs, including gonorrhea and chlamydia if:  You are sexually active and are younger than 24 years.  You are older than 24 years, and your health care provider tells you that you are at risk for this type of infection.  Your sexual activity has changed since you were last screened, and you are at an increased risk for chlamydia or gonorrhea. Ask your health care provider if you are at risk.  If you are at risk of being infected with HIV, it is recommended that you take a prescription medicine daily to prevent HIV infection. This is called pre-exposure prophylaxis (PrEP). You are  considered at risk if:  You are a man who has sex with other men (MSM).  You are a heterosexual man who is sexually active with multiple partners.  You take drugs by injection.  You are sexually active with a partner who has HIV.  Talk with your health care provider about whether you are at high risk of being infected with HIV. If you choose to begin PrEP, you should first be tested for HIV. You should then be tested every 3 months for as long as you are taking PrEP.  Use sunscreen. Apply sunscreen liberally and repeatedly throughout the day. You should seek shade when your shadow is shorter than you. Protect yourself by wearing long sleeves, pants, a wide-brimmed hat, and sunglasses year round whenever you are outdoors.  Tell your health care provider of new moles or changes in moles, especially if there is a change in shape or color. Also, tell your health care provider if a mole is larger than the size of a pencil  eraser.  A one-time screening for abdominal aortic aneurysm (AAA) and surgical repair of large AAAs by ultrasound is recommended for men aged 66-75 years who are current or former smokers.  Stay current with your vaccines (immunizations). Document Released: 09/29/2007 Document Revised: 04/07/2013 Document Reviewed: 08/28/2010 [facility] Patient Information 2015 [place], [place]. This information is not intended to replace advice given to you by your health care provider. Make sure you discuss any questions you have with your health care provider.

## 2014-12-24 NOTE — Progress Notes (Signed)
He tient ID: Corey Todd, male    DOB: December 05, 1927  Age: 81 y.o. MRN: [id number]  The patient is here for annual Medicare wellness examination and management of other chronic and acute problems.  pATIENT HAS AN ADVANCE DIRECTIVE NAND HAS BEEN ASKED TO BRING TO OFFICE .    DNR ORDERS DISCUSSED AND FILLED OUT FOR PATIIENT WITH WIFE PRESENT    The risk factors are reflected in the social history.  The roster of all physicians providing medical care to patient - is listed in the Snapshot section of the chart.  Activities of daily living:  The patient is 100% independent in all ADLs: dressing, toileting, feeding as well as independent mobility  Home safety : The patient has smoke detectors in the home. They wear seatbelts.  There are no firearms at home. There is no violence in the home.   There is no risks for hepatitis, STDs or HIV. There is no   history of blood transfusion. They have no travel history to infectious disease endemic areas of the world.  The patient has seen their dentist in the last six month. They have seen their eye doctor in the last year. They admit to slight hearing difficulty with regard to whispered voices and some television programs.  He  wears a hearing aid.  They do not  have excessive sun exposure. Discussed the need for sun protection: hats, long sleeves and use of sunscreen if there is significant sun exposure.   Diet: the importance of a healthy diet is discussed. They do have a healthy diet.  The benefits of regular aerobic exercise were discussed. She walks 4 times per week ,  20 minutes.   Depression screen: there are no signs or vegative symptoms of depression- irritability, change in appetite, anhedonia, sadness/tearfullness.  Cognitive assessment: the patient manages all their financial and personal affairs and is actively engaged. They could relate day,date,year and events; recalled 2/3 objects at 3 minutes; performed clock-face test normally.  The  following portions of the patient's history were reviewed and updated as appropriate: allergies, current medications, past family history, past medical history,  past surgical history, past social history  and problem list.  Visual acuity was not assessed per patient preference since she has regular follow up with her ophthalmologist. Hearing and body mass index were assessed and reviewed.   During the course of the visit the patient was educated and counseled about appropriate screening and preventive services including : fall prevention , diabetes screening, nutrition counseling, colorectal cancer screening, and recommended immunizations.    CC: The primary encounter diagnosis was Encounter for Medicare annual wellness exam. Diagnoses of Counseling regarding advanced directives and goals of care and Do not resuscitate discussion were also pertinent to this visit.  History Corey Todd has a past medical history of Presbyacusis and BPH (benign prostatic hypertrophy).   He has past surgical history that includes Prostate surgery; Joint replacement (1953); Tonsillectomy and adenoidectomy (1936); Total hip arthroplasty (2007); and Eye surgery.   His family history includes Arthritis in his mother.He reports that he quit smoking about 60 years ago. He does not have any smokeless tobacco history on file. He reports that he does not drink alcohol or use illicit drugs.  Outpatient Prescriptions Prior to Visit  Medication Sig Dispense Refill  . aspirin 81 MG tablet Take 81 mg by mouth daily.    Marland Kitchen doxazosin (CARDURA) 2 MG tablet TAKE 1 TABLET BY MOUTH AT BEDTIME 90 tablet 1  . finasteride (  PROSCAR) 5 MG tablet TAKE 1 TABLET BY MOUTH EVERY DAY 90 tablet 1  . Magnesium 250 MG TABS Take 1 tablet by mouth daily.    . Multiple Vitamin (MULTIVITAMIN) tablet Take 1 tablet by mouth daily.    . polyethylene glycol powder (MIRALAX) powder Take 17 g by mouth every 3 (three) days.    . ranitidine (ZANTAC) 150 MG tablet  Take 150 mg by mouth 2 (two) times daily as needed.    . Royal Jelly 500 MG CAPS Take 1 capsule by mouth daily.     No facility-administered medications prior to visit.    Review of Systems   Patient denies headache, fevers, malaise, unintentional weight loss, skin rash, eye pain, sinus congestion and sinus pain, sore throat, dysphagia,  hemoptysis , cough, dyspnea, wheezing, chest pain, palpitations, orthopnea, edema, abdominal pain, nausea, melena, diarrhea, constipation, flank pain, dysuria, hematuria, urinary  Frequency, nocturia, numbness, tingling, seizures,  Focal weakness, Loss of consciousness,  Tremor, insomnia, depression, anxiety, and suicidal ideation.      Objective:  BP 110/64 mmHg  Pulse 72  Temp(Src) 98.1 F (36.7 C) (Oral)  Resp 12  Ht 5\' 8"  (1.727 m)  Wt 193 lb 4 oz (87.658 kg)  BMI 29.39 kg/m2  SpO2 96%  Physical Exam   General appearance: alert, cooperative and appears stated age Ears: normal TM's and external ear canals both ears Throat: lips, mucosa, and tongue normal; teeth and gums normal Neck: no adenopathy, no carotid bruit, supple, symmetrical, trachea midline and thyroid not enlarged, symmetric, no tenderness/mass/nodules Back: symmetric, no curvature. ROM normal. No CVA tenderness. Lungs: clear to auscultation bilaterally Heart: regular rate and rhythm, S1, S2 normal, no murmur, click, rub or gallop Abdomen: soft, non-tender; bowel sounds normal; no masses,  no organomegaly Pulses: 2+ and symmetric Skin: Skin color, texture, turgor normal. No rashes or lesions Lymph nodes: Cervical, supraclavicular, and axillary nodes normal. Neuro: CNs 2-12 intact. DTRs 2+/4 in biceps, brachioradialis, patellars and achilles. Muscle strength 5/5 in upper and lower exremities. Fine resting tremor bilaterally both hands cerebellar function normal. Romberg negative.  No pronator drift.   Gait normal.    Assessment & Plan:   Problem List Items Addressed This Visit       Unprioritized   Encounter for Medicare annual wellness exam - Primary    Annual Medicare wellness  exam was done as well as a comprehensive physical exam and management of acute and chronic conditions .  During the course of the visit the patient was educated and counseled about appropriate screening and preventive services including : fall prevention , diabetes screening, nutrition counseling, colorectal cancer screening, and recommended immunizations.  Printed recommendations for health maintenance screenings was given.       Counseling regarding advanced directives and goals of care    1) Verbal discussion with patient about patient's advance directive and copy requested from patient. Marland Kitchen 2) Reviewed patient's end of life wishes as expressed in the advance directive  3) DNR order discussed and signed per patient request       Do not resuscitate discussion      I have discontinued Mr. [facility] Royal Jelly. I am also having him maintain his Magnesium, multivitamin, aspirin, ranitidine, polyethylene glycol powder, doxazosin, and finasteride.  No orders of the defined types were placed in this encounter.    Medications Discontinued During This Encounter  Medication Reason  . Royal Jelly 500 MG CAPS Patient Preference    Follow-up: No Follow-up on file.  Crecencio Mc, MD

## 2014-12-24 NOTE — Progress Notes (Signed)
Pre-visit discussion using our clinic review tool. No additional management support is needed unless otherwise documented below in the visit note.  

## 2014-12-26 DIAGNOSIS — Z7189 Other specified counseling: Secondary | ICD-10-CM | POA: Insufficient documentation

## 2014-12-26 NOTE — Assessment & Plan Note (Signed)
1) Verbal discussion with patient about patient's advance directive and copy requested from patient. Marland Kitchen 2) Reviewed patient's end of life wishes as expressed in the advance directive  3) DNR order discussed and signed per patient request

## 2014-12-26 NOTE — Assessment & Plan Note (Signed)

## 2015-01-20 ENCOUNTER — Other Ambulatory Visit: Payer: Self-pay | Admitting: Internal Medicine

## 2015-01-24 DIAGNOSIS — H43813 Vitreous degeneration, bilateral: Secondary | ICD-10-CM | POA: Diagnosis not present

## 2015-05-12 ENCOUNTER — Telehealth: Payer: Self-pay

## 2015-05-12 NOTE — Telephone Encounter (Signed)
[facility] Patient Name: Corey Todd DOB: [birth year] Initial Comment Caller states he started taking blood pressure around 04/28/2015. Blood pressure is 104/58. Feels draggy today. Also, wants to know if med he takes for prostate also helps with blood pressure. Nurse Assessment Nurse: Corey Osgood, RN, Corey Todd Date/Time Corey Todd Time): 05/12/2015 4:16:01 PM Confirm and document reason for call. If symptomatic, describe symptoms. You must click the next button to save text entered. ---Caller states he started Finesteride for years Began Monitoring BP 128/64 Today 104 / 58 Feeling tired Reports he sleeps all the time. Also Doxazosyn. No other known changes. Has the patient traveled out of the country within the last 30 days? ---No Does the patient have any new or worsening symptoms? ---Yes Will a triage be completed? ---Yes Related visit to physician within the last 2 weeks? ---No Does the PT have any chronic conditions? (i.e. diabetes, asthma, etc.) ---Yes List chronic conditions. ---Enlarged Prostate, Is this a behavioral health or substance abuse call? ---No Guidelines Guideline Title Affirmed Question Affirmed Notes Low Blood Pressure [id number] Systolic BP [id number] AND [id number] taking blood pressure medications AND [3] NOT dizzy, lightheaded or weak Final Disposition User See Physician within 24 Hours Corey Osgood, RN, Corey Todd Referrals REFERRED TO PCP OFFICE Disagree/Comply: Corey Todd

## 2015-05-13 ENCOUNTER — Ambulatory Visit (INDEPENDENT_AMBULATORY_CARE_PROVIDER_SITE_OTHER): Payer: Medicare Other | Admitting: Family Medicine

## 2015-05-13 ENCOUNTER — Encounter: Payer: Self-pay | Admitting: Family Medicine

## 2015-05-13 VITALS — BP 120/70 | HR 65 | Temp 98.1°F | Ht 68.0 in | Wt 198.6 lb

## 2015-05-13 DIAGNOSIS — R031 Nonspecific low blood-pressure reading: Secondary | ICD-10-CM | POA: Diagnosis not present

## 2015-05-13 DIAGNOSIS — I9589 Other hypotension: Secondary | ICD-10-CM | POA: Insufficient documentation

## 2015-05-13 NOTE — Assessment & Plan Note (Addendum)
Patient with low blood pressure reading yesterday at home. Accompanied by overall sensation of weakness. Otherwise negative review of systems. Blood pressure in the normal range today on our cuff and patient's cuff. Patient's cuff read 129/64. Patient is feeling better today. It is possible that his "draggy" sensation is related to his blood pressure or other viral illness. I discussed decreasing the dose of his Cardura to see if this would help, though the patient opted to monitor given that he feels improved and his blood pressure is back in the normal range. He is given blood pressure parameters to call us with. He is given return precautions.

## 2015-05-13 NOTE — Progress Notes (Signed)
Pre visit review using our clinic review tool, if applicable. No additional management support is needed unless otherwise documented below in the visit note. 

## 2015-05-13 NOTE — Progress Notes (Signed)
Patient ID: Corey Todd, male   DOB: [birth year], 82 y.o.   MRN: [id number]  Tommi Rumps, MD Phone: [phone]  Corey Todd is a 82 y.o. male who presents today for same day visit.  Patient reports for the past couple of days he has been feeling "draggy." Notes sleeping more than typical. Notes he checked his blood pressure and it was 104/58 yesterday. He denies chest pain, shortness of breath, fevers, cough, URI symptoms, abdominal pain, nausea, vomiting, diarrhea, dysuria, lightheadedness, dizziness, focal weakness, numbness, and headaches. He notes he feels better today. He has been drinking plenty of fluids.  PMH: Former smoker.   ROS see history of present illness  Objective  Physical Exam Filed Vitals:   05/13/15 0756  BP: 120/70  Pulse: 65  Temp: 98.1 F (36.7 C)    BP Readings from Last 3 Encounters:  05/13/15 120/70  12/24/14 110/64  08/04/14 140/62   Wt Readings from Last 3 Encounters:  05/13/15 198 lb 9.6 oz (90.084 kg)  12/24/14 193 lb 4 oz (87.658 kg)  08/04/14 194 lb (87.998 kg)    Physical Exam  Constitutional: He is well-developed, well-nourished, and in no distress.  HENT:  Head: Normocephalic and atraumatic.  Right Ear: External ear normal.  Left Ear: External ear normal.  Mouth/Throat: Oropharynx is clear and moist. No oropharyngeal exudate.  Eyes: Conjunctivae are normal. Pupils are equal, round, and reactive to light.  Neck: Neck supple.  Cardiovascular: Normal rate, regular rhythm and normal heart sounds.  Exam reveals no gallop and no friction rub.   No murmur heard. Pulmonary/Chest: Effort normal and breath sounds normal. No respiratory distress. He has no wheezes. He has no rales.  Abdominal: Soft. Bowel sounds are normal. He exhibits no distension. There is no tenderness. There is no rebound and no guarding.  Musculoskeletal: He exhibits no edema.  Lymphadenopathy:    He has no cervical adenopathy.  Neurological: He is alert.    CN 2-12 intact, 5/5 strength in bilateral biceps, triceps, grip, quads, hamstrings, plantar and dorsiflexion, sensation to light touch intact in bilateral UE and LE, normal gait, 2+ patellar reflexes  Skin: Skin is warm and dry. He is not diaphoretic.     Assessment/Plan: Please see individual problem list.  Low blood pressure reading Patient with low blood pressure reading yesterday at home. Accompanied by overall sensation of weakness. Otherwise negative review of systems. Blood pressure in the normal range today on our cuff and patient's cuff. Patient's cuff read 129/64. Patient is feeling better today. It is possible that his "draggy" sensation is related to his blood pressure or other viral illness. I discussed decreasing the dose of his Cardura to see if this would help, though the patient opted to monitor given that he feels improved and his blood pressure is back in the normal range. He is given blood pressure parameters to call us with. He is given return precautions.   Tommi Rumps

## 2015-05-13 NOTE — Patient Instructions (Addendum)
Nice to meet you. Your blood pressure is in the normal range today.  Please monitor your blood pressure and if it drops below 110/60 please call us immediately for instructions. If you develop chest pain, shortness of breath, palpitations, lightheadedness, dizziness, or any new or changing symptoms please seek medical attention.

## 2015-05-16 ENCOUNTER — Ambulatory Visit: Payer: Self-pay | Admitting: Internal Medicine

## 2015-05-18 ENCOUNTER — Telehealth: Payer: Self-pay | Admitting: Internal Medicine

## 2015-05-18 ENCOUNTER — Telehealth: Payer: Self-pay | Admitting: Family Medicine

## 2015-05-18 NOTE — Telephone Encounter (Signed)
Pt was advised to call by Dr Caryl Bis with his BP numbers if it dropped past 110. Today BP is 105/60. Thank you!

## 2015-05-18 NOTE — Telephone Encounter (Signed)
Sent to me in error see below

## 2015-05-18 NOTE — Telephone Encounter (Signed)
Pt states that bp readings has been 109/60 today, 109/60 - 1/31, 101/67 - 1/29, 95/50 - 1/26. Pt states that he has been taking prostate medication called Doxazosin 2mg , he stopped taking it three days ago to see if was the cause of the low bp readings. Please advise

## 2015-05-19 NOTE — Telephone Encounter (Signed)
Please see if the patient has had any symptoms with this. Is a lightheaded or dizzy? Please see if his blood pressures improved with stopping the doxazosin.

## 2015-05-20 ENCOUNTER — Other Ambulatory Visit: Payer: Self-pay | Admitting: Internal Medicine

## 2015-05-20 NOTE — Telephone Encounter (Signed)
BP is adequate for age. He should continue to monitor.

## 2015-05-20 NOTE — Telephone Encounter (Signed)
He said that he would continue to monitor when I spoke with him.

## 2015-05-20 NOTE — Telephone Encounter (Signed)
Spoke with patient and he stats that he has not been dizzy or lightheaded. BP has gotten better with being off doxazosin . His BP today was 141/75

## 2015-05-20 NOTE — Telephone Encounter (Signed)
Proscar filled

## 2015-05-30 ENCOUNTER — Encounter: Payer: Self-pay | Admitting: Internal Medicine

## 2015-05-30 ENCOUNTER — Ambulatory Visit (INDEPENDENT_AMBULATORY_CARE_PROVIDER_SITE_OTHER): Payer: Medicare Other | Admitting: Internal Medicine

## 2015-05-30 VITALS — BP 124/62 | HR 76 | Temp 97.7°F | Resp 14 | Ht 68.0 in | Wt 197.4 lb

## 2015-05-30 DIAGNOSIS — I9589 Other hypotension: Secondary | ICD-10-CM

## 2015-05-30 DIAGNOSIS — R5383 Other fatigue: Secondary | ICD-10-CM | POA: Diagnosis not present

## 2015-05-30 DIAGNOSIS — I959 Hypotension, unspecified: Secondary | ICD-10-CM | POA: Insufficient documentation

## 2015-05-30 DIAGNOSIS — N400 Enlarged prostate without lower urinary tract symptoms: Secondary | ICD-10-CM | POA: Diagnosis not present

## 2015-05-30 DIAGNOSIS — E559 Vitamin D deficiency, unspecified: Secondary | ICD-10-CM

## 2015-05-30 LAB — CBC WITH DIFFERENTIAL/PLATELET
BASOS PCT: 0.4 % (ref 0.0–3.0)
Basophils Absolute: 0 10*3/uL (ref 0.0–0.1)
EOS ABS: 0.3 10*3/uL (ref 0.0–0.7)
EOS PCT: 3.1 % (ref 0.0–5.0)
HEMATOCRIT: 44.6 % (ref 39.0–52.0)
HEMOGLOBIN: 14.5 g/dL (ref 13.0–17.0)
LYMPHS PCT: 30.8 % (ref 12.0–46.0)
Lymphs Abs: 2.8 10*3/uL (ref 0.7–4.0)
MCHC: 32.5 g/dL (ref 30.0–36.0)
MCV: 87.7 fl (ref 78.0–100.0)
Monocytes Absolute: 0.6 10*3/uL (ref 0.1–1.0)
Monocytes Relative: 7 % (ref 3.0–12.0)
Neutro Abs: 5.4 10*3/uL (ref 1.4–7.7)
Neutrophils Relative %: 58.7 % (ref 43.0–77.0)
Platelets: 252 10*3/uL (ref 150.0–400.0)
RBC: 5.09 Mil/uL (ref 4.22–5.81)
RDW: 13.6 % (ref 11.5–15.5)
WBC: 9.2 10*3/uL (ref 4.0–10.5)

## 2015-05-30 LAB — COMPREHENSIVE METABOLIC PANEL
ALK PHOS: 66 U/L (ref 39–117)
ALT: 13 U/L (ref 0–53)
AST: 17 U/L (ref 0–37)
Albumin: 4.1 g/dL (ref 3.5–5.2)
BUN: 15 mg/dL (ref 6–23)
CALCIUM: 9.7 mg/dL (ref 8.4–10.5)
CO2: 28 meq/L (ref 19–32)
Chloride: 107 mEq/L (ref 96–112)
Creatinine, Ser: 1.09 mg/dL (ref 0.40–1.50)
GFR: 68.17 mL/min (ref >60.00)
Glucose, Bld: 122 mg/dL — ABNORMAL HIGH (ref 70–99)
POTASSIUM: 4.4 meq/L (ref 3.5–5.1)
Sodium: 141 mEq/L (ref 135–145)
Total Bilirubin: 1.1 mg/dL (ref 0.2–1.2)
Total Protein: 7.1 g/dL (ref 6.0–8.3)

## 2015-05-30 LAB — TSH: TSH: 3.6 u[IU]/mL (ref 0.35–4.50)

## 2015-05-30 LAB — VITAMIN D 25 HYDROXY (VIT D DEFICIENCY, FRACTURES): VITD: 32.26 ng/mL (ref 30.00–100.00)

## 2015-05-30 MED ORDER — DOXAZOSIN MESYLATE 1 MG PO TABS: 1.0000 mg | ORAL_TABLET | Freq: Every day | ORAL | Status: DC

## 2015-05-30 NOTE — Patient Instructions (Addendum)
Your home blood pressure readings are actually ten pts below the value that you are getting  on your machine, so at times it is TOO LOW   I want you to try reducing the Cardura to 1 mg daily   If this doesn't help,  You can try increasing the salt in your diet   Labs today to make sure you are not hypothyroid or anemic  I also recommend that you get 15 minutes of morning sunshine every day !

## 2015-05-30 NOTE — Progress Notes (Signed)
Pre visit review using our clinic review tool, if applicable. No additional management support is needed unless otherwise documented below in the visit note. 

## 2015-05-30 NOTE — Progress Notes (Signed)
Subjective:  Patient ID: Corey Todd, male    DOB: 05-28-1927  Age: 82 y.o. MRN: [id number]  CC: The primary encounter diagnosis was Other specified hypotension. Diagnoses of Other fatigue, Vitamin D deficiency, Hypotension, iatrogenic, and BPH (benign prostatic hypertrophy) were also pertinent to this visit.  HPI Corey Todd presents for follow up on history of hypertension with recent recurrence of hypotension. recent low readings at home,  Last Friday felt really "punky"  No energy.  No ambition,  Lasted one day   Was seen on jan 27th,  There was a 9 pt differential between office and home machine simultaneous readings.   Home machines were over estimating his systolic by 9 pts.  Fridays' actual BP was systolic  of 90   Taking only cardura 2 mg daily with meals, along with proscar.    Chronic constipation : Uses a glycerin suppository and miralax daily for many years.  Stays active in the ministry .  Drinks 2 bottles of water  And regular coffee several cups daily.  Nocturia and polyuria small amounts due to prostate issues .  Sleeps well,  Falls asleep whenever he sits down , but not while driving or doing other activities. .  Diet reviewed:  No added salt .  GERD minimal uses zantac  2/week   Outpatient Prescriptions Prior to Visit  Medication Sig Dispense Refill  . aspirin 81 MG tablet Take 81 mg by mouth daily.    . finasteride (PROSCAR) 5 MG tablet TAKE 1 TABLET BY MOUTH EVERY DAY 90 tablet 2  . Magnesium 250 MG TABS Take 1 tablet by mouth daily.    . Multiple Vitamin (MULTIVITAMIN) tablet Take 1 tablet by mouth daily.    . polyethylene glycol powder (MIRALAX) powder Take 17 g by mouth every 3 (three) days.    . ranitidine (ZANTAC) 150 MG tablet Take 150 mg by mouth 2 (two) times daily as needed.    . doxazosin (CARDURA) 2 MG tablet TAKE 1 TABLET BY MOUTH AT BEDTIME 90 tablet 1   No facility-administered medications prior to visit.    Review of Systems;  Patient  denies headache, fevers, malaise, unintentional weight loss, skin rash, eye pain, sinus congestion and sinus pain, sore throat, dysphagia,  hemoptysis , cough, dyspnea, wheezing, chest pain, palpitations, orthopnea, edema, abdominal pain, nausea, melena, diarrhea, constipation, flank pain, dysuria, hematuria, urinary  Frequency, nocturia, numbness, tingling, seizures,  Focal weakness, Loss of consciousness,  Tremor, insomnia, depression, anxiety, and suicidal ideation.      Objective:  BP 124/62 mmHg  Pulse 76  Temp(Src) 97.7 F (36.5 C) (Oral)  Resp 14  Ht 5\' 8"  (1.727 m)  Wt 197 lb 6.4 oz (89.54 kg)  BMI 30.02 kg/m2  SpO2 95%  BP Readings from Last 3 Encounters:  05/30/15 124/62  05/13/15 120/70  12/24/14 110/64    Wt Readings from Last 3 Encounters:  05/30/15 197 lb 6.4 oz (89.54 kg)  05/13/15 198 lb 9.6 oz (90.084 kg)  12/24/14 193 lb 4 oz (87.658 kg)    General appearance: alert, cooperative and appears stated age Ears: normal TM's and external ear canals both ears Throat: lips, mucosa, and tongue normal; teeth and gums normal Neck: no adenopathy, no carotid bruit, supple, symmetrical, trachea midline and thyroid not enlarged, symmetric, no tenderness/mass/nodules Back: symmetric, no curvature. ROM normal. No CVA tenderness. Lungs: clear to auscultation bilaterally Heart: regular rate and rhythm, S1, S2 normal, no murmur, click, rub or gallop Abdomen: soft,  non-tender; bowel sounds normal; no masses,  no organomegaly Pulses: 2+ and symmetric Skin: Skin color, texture, turgor normal. No rashes or lesions Lymph nodes: Cervical, supraclavicular, and axillary nodes normal.  No results found for: HGBA1C  Lab Results  Component Value Date   CREATININE 1.09 05/30/2015   CREATININE 1.05 08/04/2014   CREATININE 1.0 12/07/2013    Lab Results  Component Value Date   WBC 9.2 05/30/2015   HGB 14.5 05/30/2015   HCT 44.6 05/30/2015   PLT 252.0 05/30/2015   GLUCOSE 122*  05/30/2015   CHOL 146 11/21/2012   TRIG 108.0 11/21/2012   HDL 48.80 11/21/2012   LDLCALC 76 11/21/2012   ALT 13 05/30/2015   AST 17 05/30/2015   NA 141 05/30/2015   K 4.4 05/30/2015   CL 107 05/30/2015   CREATININE 1.09 05/30/2015   BUN 15 05/30/2015   CO2 28 05/30/2015   TSH 3.60 05/30/2015    No results found.  Assessment & Plan:   Problem List Items Addressed This Visit    BPH (benign prostatic hypertrophy)    Now managed with ccardura and doxasozin which are causing side effects of hypotension. Dose reduced on cardura today to 1 mg      Hypotension, iatrogenic    secondary to Cardura,  Will reduce dose to 1 mg daily and follow.  He is steady on his feet and has had no falls or dizzy spells. Anemia , dehydration,  Hypothyroid all ruled out  Lab Results  Component Value Date   WBC 9.2 05/30/2015   HGB 14.5 05/30/2015   HCT 44.6 05/30/2015   MCV 87.7 05/30/2015   PLT 252.0 05/30/2015   Lab Results  Component Value Date   TSH 3.60 05/30/2015   Lab Results  Component Value Date   NA 141 05/30/2015   K 4.4 05/30/2015   CL 107 05/30/2015   CO2 28 05/30/2015   Lab Results  Component Value Date   CREATININE 1.09 05/30/2015         RESOLVED: Hypotension - Primary   Relevant Medications   doxazosin (CARDURA) 1 MG tablet   Other Relevant Orders   Comprehensive metabolic panel (Completed)    Other Visit Diagnoses    Other fatigue        Relevant Orders    Comprehensive metabolic panel (Completed)    TSH (Completed)    CBC with Differential/Platelet (Completed)    Vitamin D deficiency        Relevant Orders    VITAMIN D 25 Hydroxy (Vit-D Deficiency, Fractures) (Completed)      A total of 25 minutes of face to face time was spent with patient more than half of which was spent in counselling about the above mentioned conditions  and coordination of care   I have changed Corey Todd's doxazosin. I am also having him maintain his Magnesium, multivitamin,  aspirin, ranitidine, polyethylene glycol powder, and finasteride.  Meds ordered this encounter  Medications  . doxazosin (CARDURA) 1 MG tablet    Sig: Take 1 tablet (1 mg total) by mouth at bedtime.    Dispense:  30 tablet    Refill:  3    Medications Discontinued During This Encounter  Medication Reason  . doxazosin (CARDURA) 2 MG tablet Reorder    Follow-up: Return in about 3 months (around 08/27/2015).   Crecencio Mc, MD

## 2015-05-31 NOTE — Assessment & Plan Note (Signed)
Now managed with ccardura and doxasozin which are causing side effects of hypotension. Dose reduced on cardura today to 1 mg

## 2015-05-31 NOTE — Assessment & Plan Note (Addendum)
secondary to Cardura,  Will reduce dose to 1 mg daily and follow.  He is steady on his feet and has had no falls or dizzy spells. Anemia , dehydration,  Hypothyroid all ruled out  Lab Results  Component Value Date   WBC 9.2 05/30/2015   HGB 14.5 05/30/2015   HCT 44.6 05/30/2015   MCV 87.7 05/30/2015   PLT 252.0 05/30/2015   Lab Results  Component Value Date   TSH 3.60 05/30/2015   Lab Results  Component Value Date   NA 141 05/30/2015   K 4.4 05/30/2015   CL 107 05/30/2015   CO2 28 05/30/2015   Lab Results  Component Value Date   CREATININE 1.09 05/30/2015

## 2015-06-01 ENCOUNTER — Encounter: Payer: Self-pay | Admitting: Internal Medicine

## 2015-08-05 ENCOUNTER — Other Ambulatory Visit: Payer: Self-pay | Admitting: Internal Medicine

## 2015-08-15 ENCOUNTER — Other Ambulatory Visit: Payer: Self-pay | Admitting: Internal Medicine

## 2015-09-09 ENCOUNTER — Ambulatory Visit (INDEPENDENT_AMBULATORY_CARE_PROVIDER_SITE_OTHER): Payer: Medicare Other | Admitting: Family Medicine

## 2015-09-09 ENCOUNTER — Encounter: Payer: Self-pay | Admitting: Family Medicine

## 2015-09-09 VITALS — BP 120/62 | HR 82 | Temp 98.2°F | Ht 68.0 in | Wt 195.0 lb

## 2015-09-09 DIAGNOSIS — L989 Disorder of the skin and subcutaneous tissue, unspecified: Secondary | ICD-10-CM

## 2015-09-09 NOTE — Progress Notes (Signed)
   Subjective:  Patient ID: Corey Todd, male    DOB: [birth year]  Age: 82 y.o. MRN: [id number]  CC: Skin lesion   HPI:  82 year old male presents with the above complaint.  Patient states that he's had a skin lesion on his right of his nose that has been changing over the past 2 weeks. He states that he is gotten larger and has changed in color. He states that it has been slightly dark. No other associated symptoms. No exacerbating or relieving factors. He states that his wife was concerned about potential cancer and wanted him evaluated. He is requesting evaluation today.  Social Hx   Social History   Social History  . Marital Status: Unknown    Spouse Name: N/A  . Number of Children: N/A  . Years of Education: N/A   Social History Main Topics  . Smoking status: Former Smoker    Quit date: 04/16/1954  . Smokeless tobacco: None  . Alcohol Use: No  . Drug Use: No  . Sexual Activity: Not Asked   Other Topics Concern  . None   Social History Narrative   Review of Systems  Constitutional: Negative.   Skin: Positive for color change.   Objective:  BP 120/62 mmHg  Pulse 82  Temp(Src) 98.2 F (36.8 C) (Oral)  Ht 5\' 8"  (1.727 m)  Wt 195 lb (88.451 kg)  BMI 29.66 kg/m2  SpO2 97%  BP/Weight 09/09/2015 05/30/2015 [id number]  Systolic BP 123456 [id number]  Diastolic BP 62 62 70  Wt. (Lbs) 195 197.4 198.6  BMI 29.66 30.02 30.2   Physical Exam  Constitutional: He is oriented to person, place, and time. He appears well-developed. No distress.  Pulmonary/Chest: Effort normal.  Neurological: He is alert and oriented to person, place, and time.  Skin:     Raised discolored lesion noted just lateral to the nose. Course. Superior portion has a area of dark pigmentation that appears to be from irritation.  Psychiatric: He has a normal mood and affect.  Vitals reviewed.  Lab Results  Component Value Date   WBC 9.2 05/30/2015   HGB 14.5 05/30/2015   HCT 44.6 05/30/2015   PLT  252.0 05/30/2015   GLUCOSE 122* 05/30/2015   CHOL 146 11/21/2012   TRIG 108.0 11/21/2012   HDL 48.80 11/21/2012   LDLCALC 76 11/21/2012   ALT 13 05/30/2015   AST 17 05/30/2015   NA 141 05/30/2015   K 4.4 05/30/2015   CL 107 05/30/2015   CREATININE 1.09 05/30/2015   BUN 15 05/30/2015   CO2 28 05/30/2015   TSH 3.60 05/30/2015   Assessment & Plan:   Problem List Items Addressed This Visit    Skin lesion of face - Primary    New problem. Appears to be a benign seborrheic keratosis with an area of irritation. Appears benign. Reassurance provided. Patient encouraged to return if it continues to changing color or gets larger/bothersome.        Follow-up: PRN  [place]

## 2015-09-09 NOTE — Patient Instructions (Signed)
Keep an eye on it!  I thinks its benign - its called a seborrheic keratosis.  Follow up if it worsens.  Seborrheic Keratosis Seborrheic keratosis is a common, noncancerous (benign) skin growth. This condition causes waxy, rough, tan, brown, or black spots to appear on the skin. These skin growths can be flat or raised. CAUSES The cause of this condition is not known. RISK FACTORS This condition is more likely to develop in:  People who have a family history of seborrheic keratosis.  People who are 26 or older.  People who are pregnant.  People who have had estrogen replacement therapy. SYMPTOMS This condition often occurs on the face, chest, shoulders, back, or other areas. These growths:  Are usually painless, but may become irritated and itchy.  Can be yellow, brown, black, or other colors.  Are slightly raised or have a flat surface.  Are sometimes rough or wart-like in texture.  Are often waxy on the surface.  Are round or oval-shaped.  Sometimes look like they are "stuck on."  Often occur in groups, but may occur as a single growth. DIAGNOSIS This condition is diagnosed with a medical history and physical exam. A sample of the growth may be tested (skin biopsy). You may need to see a skin specialist (dermatologist). TREATMENT Treatment is not usually needed for this condition, unless the growths are irritated or are often bleeding. You may also choose to have the growths removed if you do not like their appearance. Most commonly, these growths are treated with a procedure in which liquid nitrogen is applied to "freeze" off the growth (cryosurgery). They may also be burned off with electricity or cut off. HOME CARE INSTRUCTIONS  Watch your growth for any changes.  Keep all follow-up visits as told by your health care provider. This is important.  Do not scratch or pick at the growth or growths. This can cause them to become irritated or infected. SEEK MEDICAL CARE  IF:  You suddenly have many new growths.  Your growth bleeds, itches, or hurts.  Your growth suddenly becomes larger or changes color.   This information is not intended to replace advice given to you by your health care provider. Make sure you discuss any questions you have with your health care provider.   Document Released: 05/05/2010 Document Revised: 12/22/2014 Document Reviewed: 08/18/2014 Elsevier Interactive Patient Education [employer].

## 2015-09-09 NOTE — Assessment & Plan Note (Signed)
New problem. Appears to be a benign seborrheic keratosis with an area of irritation. Appears benign. Reassurance provided. Patient encouraged to return if it continues to changing color or gets larger/bothersome.

## 2015-09-09 NOTE — Progress Notes (Signed)
Pre visit review using our clinic review tool, if applicable. No additional management support is needed unless otherwise documented below in the visit note. 

## 2015-09-16 ENCOUNTER — Ambulatory Visit (INDEPENDENT_AMBULATORY_CARE_PROVIDER_SITE_OTHER): Payer: Medicare Other | Admitting: Family Medicine

## 2015-09-16 ENCOUNTER — Encounter: Payer: Self-pay | Admitting: Family Medicine

## 2015-09-16 VITALS — BP 142/68 | HR 83 | Temp 98.2°F | Ht 68.0 in | Wt 196.0 lb

## 2015-09-16 DIAGNOSIS — L989 Disorder of the skin and subcutaneous tissue, unspecified: Secondary | ICD-10-CM

## 2015-09-16 NOTE — Progress Notes (Signed)
   Subjective:  Patient ID: Corey Todd, male    DOB: April 25, 1927  Age: 82 y.o. MRN: [id number]  CC: Skin lesion  HPI:  82 year old male presents requesting dermatology referral.  I saw the patient on 5/26 for a skin lesion. I felt that it was secondary to a seborrheic keratosis with irritation. I informed him that this was benign. I offered dermatology referral and he declined. He presents today requesting referral at the insistence of his wife.  Social Hx   Social History   Social History  . Marital Status: Unknown    Spouse Name: N/A  . Number of Children: N/A  . Years of Education: N/A   Social History Main Topics  . Smoking status: Former Smoker    Quit date: 04/16/1954  . Smokeless tobacco: None  . Alcohol Use: No  . Drug Use: No  . Sexual Activity: Not Asked   Other Topics Concern  . None   Social History Narrative   Review of Systems  Constitutional: Negative.   Skin:       Skin lesion.    Objective:  BP 142/68 mmHg  Pulse 83  Temp(Src) 98.2 F (36.8 C) (Oral)  Ht 5\' 8"  (1.727 m)  Wt 196 lb (88.905 kg)  BMI 29.81 kg/m2  SpO2 96%  BP/Weight 09/16/2015 09/09/2015 [id number]  Systolic BP [id number] 123456 A999333  Diastolic BP 68 62 62  Wt. (Lbs) 196 195 197.4  BMI 29.81 29.66 30.02   Physical Exam  Constitutional: He appears well-developed. No distress.  Skin:  Seborrheic keratosis noted just to the right of the nose.  Vitals reviewed.   Lab Results  Component Value Date   WBC 9.2 05/30/2015   HGB 14.5 05/30/2015   HCT 44.6 05/30/2015   PLT 252.0 05/30/2015   GLUCOSE 122* 05/30/2015   CHOL 146 11/21/2012   TRIG 108.0 11/21/2012   HDL 48.80 11/21/2012   LDLCALC 76 11/21/2012   ALT 13 05/30/2015   AST 17 05/30/2015   NA 141 05/30/2015   K 4.4 05/30/2015   CL 107 05/30/2015   CREATININE 1.09 05/30/2015   BUN 15 05/30/2015   CO2 28 05/30/2015   TSH 3.60 05/30/2015   Assessment & Plan:   Problem List Items Addressed This Visit    Skin lesion of  face - Primary    I seen the patient for this recently. We already discussed dermatology referral. Will place referral today. I will not be charging the patient for this visit as I have already seen him for this and it should have not been scheduled.         Follow-up: PRN  [place]

## 2015-09-16 NOTE — Assessment & Plan Note (Signed)
I seen the patient for this recently. We already discussed dermatology referral. Will place referral today. I will not be charging the patient for this visit as I have already seen him for this and it should have not been scheduled.

## 2015-12-13 DIAGNOSIS — H903 Sensorineural hearing loss, bilateral: Secondary | ICD-10-CM | POA: Diagnosis not present

## 2015-12-13 DIAGNOSIS — H6123 Impacted cerumen, bilateral: Secondary | ICD-10-CM | POA: Diagnosis not present

## 2015-12-26 ENCOUNTER — Ambulatory Visit (INDEPENDENT_AMBULATORY_CARE_PROVIDER_SITE_OTHER): Payer: Medicare Other | Admitting: Internal Medicine

## 2015-12-26 ENCOUNTER — Encounter: Payer: Self-pay | Admitting: Internal Medicine

## 2015-12-26 VITALS — BP 116/58 | HR 79 | Temp 98.2°F | Resp 16 | Ht 67.0 in | Wt 191.8 lb

## 2015-12-26 DIAGNOSIS — E669 Obesity, unspecified: Secondary | ICD-10-CM

## 2015-12-26 DIAGNOSIS — Z23 Encounter for immunization: Secondary | ICD-10-CM

## 2015-12-26 DIAGNOSIS — Z7189 Other specified counseling: Secondary | ICD-10-CM

## 2015-12-26 DIAGNOSIS — Z0000 Encounter for general adult medical examination without abnormal findings: Secondary | ICD-10-CM

## 2015-12-26 DIAGNOSIS — R739 Hyperglycemia, unspecified: Secondary | ICD-10-CM | POA: Diagnosis not present

## 2015-12-26 LAB — COMPREHENSIVE METABOLIC PANEL
ALT: 11 U/L (ref 0–53)
AST: 15 U/L (ref 0–37)
Albumin: 3.9 g/dL (ref 3.5–5.2)
Alkaline Phosphatase: 54 U/L (ref 39–117)
BUN: 18 mg/dL (ref 6–23)
CHLORIDE: 108 meq/L (ref 96–112)
CO2: 25 meq/L (ref 19–32)
Calcium: 8.8 mg/dL (ref 8.4–10.5)
Creatinine, Ser: 1.06 mg/dL (ref 0.40–1.50)
GFR: 70.31 mL/min (ref >60.00)
GLUCOSE: 95 mg/dL (ref 70–99)
POTASSIUM: 4 meq/L (ref 3.5–5.1)
Sodium: 139 mEq/L (ref 135–145)
Total Bilirubin: 0.8 mg/dL (ref 0.2–1.2)
Total Protein: 7 g/dL (ref 6.0–8.3)

## 2015-12-26 LAB — HEMOGLOBIN A1C: HEMOGLOBIN A1C: 5.4 % (ref 4.6–6.5)

## 2015-12-26 NOTE — Progress Notes (Signed)
Patient ID: Corey Todd, male    DOB: 28-Jun-1927  Age: 82 y.o. MRN: [id number]  The patient is here for annual Medicare wellness visit and comprehensive preventive visit The risk factors are reflected in the social history.  The roster of all physicians providing medical care to patient - is listed in the Snapshot section of the chart.  Activities of daily living:  The patient is 100% independent in all ADLs: dressing, toileting, feeding as well as independent mobility  Home safety : The patient has smoke detectors in the home. They wear seatbelts.  There are no firearms at home. There is no violence in the home.   There is no risks for hepatitis, STDs or HIV. There is no   history of blood transfusion. They have no travel history to infectious disease endemic areas of the world.  The patient has seen their dentist in the last six month. They have seen their eye doctor in the last year. They admit to slight hearing difficulty with regard to whispered voices and some television programs.  They have deferred audiologic testing in the last year.  They do not  have excessive sun exposure. Discussed the need for sun protection: hats, long sleeves and use of sunscreen if there is significant sun exposure.   Diet: the importance of a healthy diet is discussed. They do have a healthy diet.  The benefits of regular aerobic exercise were discussed. She walks 4 times per week ,  20 minutes.   Depression screen: there are no signs or vegative symptoms of depression- irritability, change in appetite, anhedonia, sadness/tearfullness.  Cognitive assessment: the patient manages all their financial and personal affairs and is actively engaged. They could relate day,date,year and events; recalled 2/3 objects at 3 minutes; performed clock-face test normally.  The following portions of the patient's history were reviewed and updated as appropriate: allergies, current medications, past family history, past  medical history,  past surgical history, past social history  and problem list.  Visual acuity was not assessed per patient preference since she has regular follow up with her ophthalmologist. Hearing and body mass index were assessed and reviewed.   During the course of the visit the patient was educated and counseled about appropriate screening and preventive services including : fall prevention , diabetes screening, nutrition counseling, colorectal cancer screening, and recommended immunizations.      The risk factors are reflected in the social history.  The roster of all physicians providing medical care to patient - is listed in the Snapshot section of the chart.  Activities of daily living:  The patient is 100% independent in all ADLs: dressing, toileting, feeding as well as independent mobility  Home safety : The patient has smoke detectors in the home. They wear seatbelts.  There are no firearms at home. There is no violence in the home.   There is no risks for hepatitis, STDs or HIV. There is no   history of blood transfusion. They have no travel history to infectious disease endemic areas of the world.  The patient has seen their dentist in the last six month. They have seen their eye doctor in the last year. They admit to slight hearing difficulty with regard to whispered voices and some television programs.  They have deferred audiologic testing in the last year.  They do not  have excessive sun exposure. Discussed the need for sun protection: hats, long sleeves and use of sunscreen if there is significant sun exposure.  Diet: the importance of a healthy diet is discussed. They do have a healthy diet.  The benefits of regular aerobic exercise were discussed. He does not walk at all for exercise.  No excuse o fjoint pain, etc.  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 .   Depression screen: there are no signs or vegative symptoms of depression-  irritability, change in appetite, anhedonia, sadness/tearfullness.  Cognitive assessment: the patient manages all their financial and personal affairs and is actively engaged. They could relate day,date,year and events; recalled 2/3 objects at 3 minutes; performed clock-face test normally.  The following portions of the patient's history were reviewed and updated as appropriate: allergies, current medications, past family history, past medical history,  past surgical history, past social history  and problem list.  Visual acuity was not assessed per patient preference since she has regular follow up with her ophthalmologist. Hearing and body mass index were assessed and reviewed.   During the course of the visit the patient was educated and counseled about appropriate screening and preventive services including : fall prevention , diabetes screening, nutrition counseling, colorectal cancer screening, and recommended immunizations.    During the course of the visit , End of Life objectives were reviewed .  Patient dhas a  living will in place and a healthcare power of attorney. DNR orders were reviewed and still in effect.,    CC: The primary encounter diagnosis was Hyperglycemia. Diagnoses of Encounter for immunization, Do not resuscitate discussion, Encounter for Medicare annual wellness exam, and Obesity (BMI 30-39.9) were also pertinent to this visit.   Has lost 5 lbs  Using  miralax and glycerin suppository daily to manage constipation . Has a BM daily  No falls Bilateral hearing aides  Cerumen removal done a week ago by [employer] well , no snoring .  Naps occasionally during the day no trouble sleeping S/p bilateral cataracts 5 years ago .  Has annual eye exam . Due  Had a bowel of cereal this morning   History Zevion has a past medical history of BPH (benign prostatic hypertrophy) and Presbyacusis.   He has a past surgical history that includes ostate surgery; Joint replacement  (1953); Tonsillectomy and adenoidectomy (1936); Total hip arthroplasty (2007); and Eye surgery.   His family history includes Arthritis in his mother.He reports that he quit smoking about 61 years ago. He does not have any smokeless tobacco history on file. He reports that he does not drink alcohol or use drugs.  Outpatient Medications Prior to Visit  Medication Sig Dispense Refill  . aspirin 81 MG tablet Take 81 mg by mouth daily.    Marland Kitchen doxazosin (CARDURA) 2 MG tablet TAKE 1 TABLET BY MOUTH AT BEDTIME 90 tablet 1  . finasteride (PROSCAR) 5 MG tablet TAKE 1 TABLET BY MOUTH EVERY DAY 90 tablet 2  . Magnesium 250 MG TABS Take 1 tablet by mouth daily.    . Multiple Vitamin (MULTIVITAMIN) tablet Take 1 tablet by mouth daily.    . polyethylene glycol powder (MIRALAX) powder Take 17 g by mouth every 3 (three) days.    . ranitidine (ZANTAC) 150 MG tablet Take 150 mg by mouth 2 (two) times daily as needed.    . doxazosin (CARDURA) 1 MG tablet Take 1 tablet (1 mg total) by mouth at bedtime. 30 tablet 3  . doxazosin (CARDURA) 2 MG tablet TAKE 1 TABLET BY MOUTH AT BEDTIME 90 tablet 1   No facility-administered medications prior to  visit.     Review of Systems   Patient denies headache, fevers, malaise, unintentional weight loss, skin rash, eye pain, sinus congestion and sinus pain, sore throat, dysphagia,  hemoptysis , cough, dyspnea, wheezing, chest pain, palpitations, orthopnea, edema, abdominal pain, nausea, melena, diarrhea, constipation, flank pain, dysuria, hematuria, urinary  Frequency, nocturia, numbness, tingling, seizures,  Focal weakness, Loss of consciousness,  Tremor, insomnia, depression, anxiety, and suicidal ideation.      Objective:  BP (!) 116/58 (BP Location: Right Arm, Patient Position: Sitting, Cuff Size: Normal)   Pulse 79   Temp 98.2 F (36.8 C)   Resp 16   Ht 5\' 7"  (1.702 m)   Wt 191 lb 12.8 oz (87 kg)   SpO2 97%   BMI 30.04 kg/m   Physical Exam   General  appearance: alert, cooperative and appears stated age Ears: normal TM's and external ear canals both ears Throat: lips, mucosa, and tongue normal; teeth and gums normal Neck: no adenopathy, no carotid bruit, supple, symmetrical, trachea midline and thyroid not enlarged, symmetric, no tenderness/mass/nodules Back: symmetric, no curvature. ROM normal. No CVA tenderness. Lungs: clear to auscultation bilaterally Heart: regular rate and rhythm, S1, S2 normal, no murmur, click, rub or gallop Abdomen: soft, non-tender; bowel sounds normal; no masses,  no organomegaly Pulses: 2+ and symmetric Skin: Skin color, texture, turgor normal. No rashes or lesions Lymph nodes: Cervical, supraclavicular, and axillary nodes normal.    Assessment & Plan:   Problem List Items Addressed This Visit    Obesity (BMI 30-39.9)    I have addressed  BMI and recommended a low glycemic index diet utilizing smaller more frequent meals to increase metabolism.  I have also recommended that patient start exercising with a goal of 30 minutes of aerobic exercise a minimum of 5 days per week.      Encounter for Medicare annual wellness exam    Annual Medicare wellness  exam was done as well as a comprehensive physical exam .  During the course of the visit the patient was educated and counseled about appropriate screening and preventive services including : fall prevention , diabetes screening, nutrition counseling,and recommended immunizations.  Printed recommendations for health maintenance screenings was given.   Lab Results  Component Value Date   HGBA1C 5.4 12/26/2015   Lab Results  Component Value Date   CREATININE 1.06 12/26/2015   Lab Results  Component Value Date   NA 139 12/26/2015   K 4.0 12/26/2015   CL 108 12/26/2015   CO2 25 12/26/2015   Lab Results  Component Value Date   ALT 11 12/26/2015   AST 15 12/26/2015   ALKPHOS 54 12/26/2015   BILITOT 0.8 12/26/2015         Do not resuscitate  discussion    Patient  has DNR        Other Visit Diagnoses    Hyperglycemia    -  Primary   Relevant Orders   Hemoglobin A1c (Completed)   Comprehensive metabolic panel (Completed)   Encounter for immunization       Relevant Orders   Flu vaccine HIGH DOSE PF (Completed)      I am having Mr. Newey maintain his Magnesium, multivitamin, aspirin, polyethylene glycol powder, finasteride, doxazosin, and ranitidine.  Meds ordered this encounter  Medications  . ranitidine (ZANTAC) 75 MG tablet    Sig: Take 75 mg by mouth 2 (two) times daily.    Medications Discontinued During This Encounter  Medication Reason  .  doxazosin (CARDURA) 1 MG tablet Duplicate  . doxazosin (CARDURA) 2 MG tablet Duplicate  . ranitidine (ZANTAC) 150 MG tablet Entry Error    Follow-up: Return in about 6 months (around 06/24/2016).   Crecencio Mc, MD

## 2015-12-26 NOTE — Patient Instructions (Signed)
Please start going for a walk every day for 20 minutes  Practice getting up from a chair 10 times daily Practice standing on one leg for 10 seconds every day  Health Maintenance, Male A healthy lifestyle and preventative care can promote health and wellness.  Maintain regular health, dental, and eye exams.  Eat a healthy diet. Foods like vegetables, fruits, whole grains, low-fat dairy products, and lean protein foods contain the nutrients you need and are low in calories. Decrease your intake of foods high in solid fats, added sugars, and salt. Get information about a proper diet from your health care provider, if necessary.  Regular physical exercise is one of the most important things you can do for your health. Most adults should get at least 150 minutes of moderate-intensity exercise (any activity that increases your heart rate and causes you to sweat) each week. In addition, most adults need muscle-strengthening exercises on 2 or more days a week.   Maintain a healthy weight. The body mass index (BMI) is a screening tool to identify possible weight problems. It provides an estimate of body fat based on height and weight. Your health care provider can find your BMI and can help you achieve or maintain a healthy weight. For males 20 years and older:  A BMI below 18.5 is considered underweight.  A BMI of 18.5 to 24.9 is normal.  A BMI of 25 to 29.9 is considered overweight.  A BMI of 30 and above is considered obese.  Maintain normal blood lipids and cholesterol by exercising and minimizing your intake of saturated fat. Eat a balanced diet with plenty of fruits and vegetables. Blood tests for lipids and cholesterol should begin at age 73 and be repeated every 5 years. If your lipid or cholesterol levels are high, you are over age 40, or you are at high risk for heart disease, you may need your cholesterol levels checked more frequently.Ongoing high lipid and cholesterol levels should be  treated with medicines if diet and exercise are not working.  If you smoke, find out from your health care provider how to quit. If you do not use tobacco, do not start.  Lung cancer screening is recommended for adults aged 43-80 years who are at high risk for developing lung cancer because of a history of smoking. A yearly low-dose CT scan of the lungs is recommended for people who have at least a 30-pack-year history of smoking and are current smokers or have quit within the past 15 years. A pack year of smoking is smoking an average of 1 pack of cigarettes a day for 1 year (for example, a 30-pack-year history of smoking could mean smoking 1 pack a day for 30 years or 2 packs a day for 15 years). Yearly screening should continue until the smoker has stopped smoking for at least 15 years. Yearly screening should be stopped for people who develop a health problem that would prevent them from having lung cancer treatment.  If you choose to drink alcohol, do not have more than 2 drinks per day. One drink is considered to be 12 oz (360 mL) of beer, 5 oz (150 mL) of wine, or 1.5 oz (45 mL) of liquor.  Avoid the use of street drugs. Do not share needles with anyone. Ask for help if you need support or instructions about stopping the use of drugs.  High blood pressure causes heart disease and increases the risk of stroke. High blood pressure is more likely to  develop in:  People who have blood pressure in the end of the normal range (100-139/85-89 mm Hg).  People who are overweight or obese.  People who are African American.  If you are 61-80 years of age, have your blood pressure checked every 3-5 years. If you are 47 years of age or older, have your blood pressure checked every year. You should have your blood pressure measured twice--once when you are at a hospital or clinic, and once when you are not at a hospital or clinic. Record the average of the two measurements. To check your blood pressure  when you are not at a hospital or clinic, you can use:  An automated blood pressure machine at a pharmacy.  A home blood pressure monitor.  If you are 64-34 years old, ask your health care provider if you should take aspirin to prevent heart disease.  Diabetes screening involves taking a blood sample to check your fasting blood sugar level. This should be done once every 3 years after age 51 if you are at a normal weight and without risk factors for diabetes. Testing should be considered at a younger age or be carried out more frequently if you are overweight and have at least 1 risk factor for diabetes.  Colorectal cancer can be detected and often prevented. Most routine colorectal cancer screening begins at the age of 47 and continues through age 8. However, your health care provider may recommend screening at an earlier age if you have risk factors for colon cancer. On a yearly basis, your health care provider may provide home test kits to check for hidden blood in the stool. A small camera at the end of a tube may be used to directly examine the colon (sigmoidoscopy or colonoscopy) to detect the earliest forms of colorectal cancer. Talk to your health care provider about this at age 65 when routine screening begins. A direct exam of the colon should be repeated every 5-10 years through age 108, unless early forms of precancerous polyps or small growths are found.  People who are at an increased risk for hepatitis B should be screened for this virus. You are considered at high risk for hepatitis B if:  You were born in a country where hepatitis B occurs often. Talk with your health care provider about which countries are considered high risk.  Your parents were born in a high-risk country and you have not received a shot to protect against hepatitis B (hepatitis B vaccine).  You have HIV or AIDS.  You use needles to inject street drugs.  You live with, or have sex with, someone who has  hepatitis B.  You are a man who has sex with other men (MSM).  You get hemodialysis treatment.  You take certain medicines for conditions like cancer, organ transplantation, and autoimmune conditions.  Hepatitis C blood testing is recommended for all people born from [birth year] through 1963 and any individual with known risk factors for hepatitis C.  Healthy men should no longer receive prostate-specific antigen (PSA) blood tests as part of routine cancer screening. Talk to your health care provider about prostate cancer screening.  Testicular cancer screening is not recommended for adolescents or adult males who have no symptoms. Screening includes self-exam, a health care provider exam, and other screening tests. Consult with your health care provider about any symptoms you have or any concerns you have about testicular cancer.  Practice safe sex. Use condoms and avoid high-risk sexual practices to reduce  the spread of sexually transmitted infections (STIs).  You should be screened for STIs, including gonorrhea and chlamydia if:  You are sexually active and are younger than 24 years.  You are older than 24 years, and your health care provider tells you that you are at risk for this type of infection.  Your sexual activity has changed since you were last screened, and you are at an increased risk for chlamydia or gonorrhea. Ask your health care provider if you are at risk.  If you are at risk of being infected with HIV, it is recommended that you take a prescription medicine daily to prevent HIV infection. This is called pre-exposure prophylaxis (PrEP). You are considered at risk if:  You are a man who has sex with other men (MSM).  You are a heterosexual man who is sexually active with multiple partners.  You take drugs by injection.  You are sexually active with a partner who has HIV.  Talk with your health care provider about whether you are at high risk of being infected with HIV. If  you choose to begin PrEP, you should first be tested for HIV. You should then be tested every 3 months for as long as you are taking PrEP.  Use sunscreen. Apply sunscreen liberally and repeatedly throughout the day. You should seek shade when your shadow is shorter than you. Protect yourself by wearing long sleeves, pants, a wide-brimmed hat, and sunglasses year round whenever you are outdoors.  Tell your health care provider of new moles or changes in moles, especially if there is a change in shape or color. Also, tell your health care provider if a mole is larger than the size of a pencil eraser.  A one-time screening for abdominal aortic aneurysm (AAA) and surgical repair of large AAAs by ultrasound is recommended for men aged 36-75 years who are current or former smokers.  Stay current with your vaccines (immunizations).   This information is not intended to replace advice given to you by your health care provider. Make sure you discuss any questions you have with your health care provider.   Document Released: 09/29/2007 Document Revised: 04/23/2014 Document Reviewed: 08/28/2010 Elsevier Interactive Patient Education [employer].

## 2015-12-26 NOTE — Assessment & Plan Note (Addendum)
Reviewed patient's  Do Not Resuscitate Orders which were made last year.  Patient continues to desire DNR status and has the order prominently displaced in the home.  

## 2015-12-27 NOTE — Assessment & Plan Note (Signed)
I have addressed  BMI and recommended a low glycemic index diet utilizing smaller more frequent meals to increase metabolism.  I have also recommended that patient start exercising with a goal of 30 minutes of aerobic exercise a minimum of 5 days per week.  

## 2015-12-27 NOTE — Assessment & Plan Note (Addendum)
Annual Medicare wellness  exam was done as well as a comprehensive physical exam .  During the course of the visit the patient was educated and counseled about appropriate screening and preventive services including : fall prevention , diabetes screening, nutrition counseling,and recommended immunizations.  Printed recommendations for health maintenance screenings was given.   Lab Results  Component Value Date   HGBA1C 5.4 12/26/2015   Lab Results  Component Value Date   CREATININE 1.06 12/26/2015   Lab Results  Component Value Date   NA 139 12/26/2015   K 4.0 12/26/2015   CL 108 12/26/2015   CO2 25 12/26/2015   Lab Results  Component Value Date   ALT 11 12/26/2015   AST 15 12/26/2015   ALKPHOS 54 12/26/2015   BILITOT 0.8 12/26/2015

## 2015-12-29 ENCOUNTER — Encounter: Payer: Self-pay | Admitting: Internal Medicine

## 2016-01-24 ENCOUNTER — Encounter: Payer: Self-pay | Admitting: Urology

## 2016-01-24 ENCOUNTER — Ambulatory Visit (INDEPENDENT_AMBULATORY_CARE_PROVIDER_SITE_OTHER): Payer: Medicare Other | Admitting: Urology

## 2016-01-24 VITALS — BP 149/69 | HR 78 | Ht 67.0 in | Wt 193.0 lb

## 2016-01-24 DIAGNOSIS — N138 Other obstructive and reflux uropathy: Secondary | ICD-10-CM

## 2016-01-24 DIAGNOSIS — R350 Frequency of micturition: Secondary | ICD-10-CM

## 2016-01-24 DIAGNOSIS — N471 Phimosis: Secondary | ICD-10-CM

## 2016-01-24 DIAGNOSIS — N401 Enlarged prostate with lower urinary tract symptoms: Secondary | ICD-10-CM

## 2016-01-24 LAB — BLADDER SCAN AMB NON-IMAGING: SCAN RESULT: 63

## 2016-01-24 NOTE — Progress Notes (Signed)
01/24/2016 4:09 PM   Corey Todd Oct 04, 1977 [id number]  Referring provider: Crecencio Mc, MD [place], [place] [address]  Chief Complaint  Patient presents with  . New Patient (Initial Visit)    phimosis     HPI: 82 yo M who presents for further evaluation of phimosis and BPH.  He reports that his penis as started to shrink and his foreskin is becoming harder to pull back.  He is not circumcised.  This has been going on for a few years but unable to fully retract foreskin for several months.   He has not tried any creams or ointments to date.    He has sparying of his urinary stream due to delfection.  He like has to sit to void.  He does get up 3x nightly to void but is not bothered by this. He also has some daytime frequency.  His stream is good other than spraying and he feel like he is able to empty.   He is on doxasozin and finasteride which is longstanding, at least 20 years.    He is a former patient of Dr. Ernst Spell s/p negative prostate biopsy >25 years ago.    PVR 63   He does take 81 mg ASA for prevention.  He is not a diabetic.         IPSS    Row Name 01/24/16 1500         International Prostate Symptom Score   How often have you had the sensation of not emptying your bladder? Not at All     How often have you had to urinate less than every two hours? About half the time     How often have you found you stopped and started again several times when you urinated? Not at All     How often have you found it difficult to postpone urination? Not at All     How often have you had a weak urinary stream? Not at All     How often have you had to strain to start urination? Not at All     How many times did you typically get up at night to urinate? 2 Times     Total IPSS Score 5       Quality of Life due to urinary symptoms   If you were to spend the rest of your life with your urinary condition just the way it is now how would you feel  about that? Mostly Disatisfied        Score:  1-7 Mild 8-19 Moderate 20-35 Severe   PMH: Past Medical History:  Diagnosis Date  . Anxiety   . Arthritis   . BPH (benign prostatic hypertrophy)   . DVT (deep vein thrombosis) in pregnancy ([place])   . Presbyacusis    right sided hearing aid    Surgical History: Past Surgical History:  Procedure Laterality Date  . EYE SURGERY     cataract surgery  . HERNIA REPAIR    . JOINT REPLACEMENT  1953   ankle crush injury, right  . PROSTATE SURGERY     biopsy   . TONSILLECTOMY AND ADENOIDECTOMY  1936  . TOTAL HIP ARTHROPLASTY  2007   left , Dr Cay Schillings    Home Medications:    Medication List       Accurate as of 01/24/16  4:09 PM. Always use your most recent med list.  aspirin 81 MG tablet Take 81 mg by mouth daily.   doxazosin 2 MG tablet Commonly known as:  CARDURA TAKE 1 TABLET BY MOUTH AT BEDTIME   finasteride 5 MG tablet Commonly known as:  PROSCAR TAKE 1 TABLET BY MOUTH EVERY DAY   Magnesium 250 MG Tabs Take 1 tablet by mouth daily.   MIRALAX powder Generic drug:  polyethylene glycol powder Take 17 g by mouth every 3 (three) days.   multivitamin tablet Take 1 tablet by mouth daily.   ranitidine 75 MG tablet Commonly known as:  ZANTAC Take 75 mg by mouth 2 (two) times daily.       Allergies: No Known Allergies  Family History: Family History  Problem Relation Age of Onset  . Arthritis Mother   . Kidney cancer Neg Hx   . Kidney disease Neg Hx   . Prostate cancer Neg Hx     Social History:  reports that he quit smoking about 61 years ago. He has never used smokeless tobacco. He reports that he does not drink alcohol or use drugs.  ROS: UROLOGY Frequent Urination?: Yes Hard to postpone urination?: Yes Burning/pain with urination?: No Get up at night to urinate?: Yes Leakage of urine?: Yes Urine stream starts and stops?: No Trouble starting stream?: No Do you have to strain to  urinate?: No Blood in urine?: No Urinary tract infection?: No Sexually transmitted disease?: No Injury to kidneys or bladder?: No Painful intercourse?: No Weak stream?: No Erection problems?: No Penile pain?: No  Gastrointestinal Nausea?: No Vomiting?: No Indigestion/heartburn?: No Diarrhea?: No Constipation?: No  Constitutional Fever: No Night sweats?: No Weight loss?: No Fatigue?: No  Skin Skin rash/lesions?: No Itching?: No  Eyes Blurred vision?: No Double vision?: No  Ears/Nose/Throat Sore throat?: No Sinus problems?: No  Hematologic/Lymphatic Swollen glands?: No Easy bruising?: No  Cardiovascular Leg swelling?: No Chest pain?: No  Respiratory Cough?: No Shortness of breath?: No  Endocrine Excessive thirst?: No  Musculoskeletal Back pain?: No Joint pain?: No  Neurological Headaches?: No Dizziness?: No  Psychologic Depression?: No Anxiety?: No  Physical Exam: BP (!) 149/69 (BP Location: Left Arm, Patient Position: Sitting, Cuff Size: Normal)   Pulse 78   Ht 5\' 7"  (1.702 m)   Wt 193 lb (87.5 kg)   BMI 30.23 kg/m   Constitutional:  Alert and oriented, No acute distress. HEENT: [place] AT, moist mucus membranes.  Trachea midline, no masses. Cardiovascular: No clubbing, cyanosis, or edema. Respiratory: Normal respiratory effort, no increased work of breathing. GI: Abdomen is soft, nontender, nondistended, no abdominal masses GU: Bilateral descended testicles, no masses, nontender. Uncircumcised phallus with significant redundant foreskin.  Fairly tight phimosis, unable to retract to visualize glands, erythematous chronically inflamed appearing. Skin: No rashes, bruises or suspicious lesions. Neurologic: Grossly intact, no focal deficits, moving all 4 extremities. Psychiatric: Normal mood and affect.  Laboratory Data: Lab Results  Component Value Date   WBC 9.2 05/30/2015   HGB 14.5 05/30/2015   HCT 44.6 05/30/2015   MCV 87.7 05/30/2015    PLT 252.0 05/30/2015    Lab Results  Component Value Date   CREATININE 1.06 12/26/2015    Lab Results  Component Value Date   HGBA1C 5.4 12/26/2015    Imaging Results for orders placed or performed in visit on 01/24/16  Bladder Scan (Post Void Residual) in office  Result Value Ref Range   Scan Result 63     Assessment & Plan:    1. Phimosis Significant severe longstanding  phimosis.   Alternatives discussed today including circumcision versus steroid/antifungal creams.  He is most interested in circumcision. Risk and benefits of this procedure were discussed at length today. He would like to try to have this done in the office under local anesthesia. The risk of bleeding, infection, damage stranding structures, dissatisfaction with cosmesis, penile sensitivity, amongst others. All his questions were answered.  Post op instructions reviewed.  He will need to hold ASA 81 mg for x7 days prior to the procedure which was discussed.    2. BPH with obstruction/lower urinary tract symptoms Long history of BPH, managing with doxazosin and finasteride x 20 years, continue these meds Voiding symptoms primarily related to phimosis - Bladder Scan (Post Void Residual) in office   Return in about 2 weeks (around 02/07/2016) for circumcision (remind to hold ASA 1 week prior).  Hollice Espy, MD  [facility] [address], [place], [place] [address] [phone]

## 2016-01-24 NOTE — Patient Instructions (Signed)
   Circumcision Information and Post Care Instructions  Preparation: Pubic Hair There is no need to completely shave your pubic hair but it is desirable to trim it fairly short. Trim your pubic hair a few days in advance of the operation to allow time for the cut ends to soften again. Hygiene On the morning of the circumcision ensure that you take a good bath or shower and pay particular attention to your genitals. Retract your foreskin as far as you can and clean well under it. Immediately before the time of the procedure empty your bowels and bladder.   After-care: After the procedure your whole penis will be swollen and look very bruised. This is a normal effect of both the injected anaesthetic and the handling it necessarily receives during the operation. These will gradually reduce over the next week or two. Underwear If you normally wear boxers you may find that they give insufficient support immediately post procedure. You may wish to consider some form of briefs which will hold your penis in position to provide support and reduce the friction The Bandage The bandage will normally be wound tightly around the penis. Leave for 48hrs and keep clean and dry.    Promoting Healing Do not apply any antiseptic cream to your penis, nor add any antiseptic to bath water. Though they do help to kill germs, most are corrosive to new skin and actually slow down healing. In the rare cases where an infection develops, see a doctor as soon as possible. Smoking can delay healing and place you at higher risk of infection. You should quit or significantly reduce the amount of cigarettes you smoke prior to and after procedure.  Pain Killers Everyone reacts differently in respect of pain. For most people circumcision will not be truly painful, but a degree of discomfort is to be expected during the first few days. If you choose to take pain killing tablets like Tylenol then follow the instructions precisely.  Do not take more than the recommended maximum dose. Do not take Aspirin or any Aspirin based product since these thin the blood and have an anti-clotting action which can increase bleeding from a wound.  The Stitches Stitches need to remain in place long enough for the cut edges to knit together but not so long as to allow the skin around them to fully heal. In practice this usually means they should remain for between 1 and 2 weeks. Although the doctor will normally use soluble (or self-dissolving) stitches and will dissolve/ fall out on their own.    Time off School or Work There is no absolute need to take time off school or work after circumcision, but you may find it very hard to concentrate on work for the first few days and so may find it useful to take a week off. A week (or even two) off work is very desirable if you do heavy lifting or if your job keeps you seated and unable to move around freely for long periods. You should naturally avoid energetic or contact sports, sexual activity, cycling and swimming until your circumcision has fully healed.      

## 2016-01-27 ENCOUNTER — Ambulatory Visit: Payer: Self-pay | Admitting: Urology

## 2016-02-13 ENCOUNTER — Telehealth: Payer: Self-pay | Admitting: Internal Medicine

## 2016-02-13 ENCOUNTER — Other Ambulatory Visit: Payer: Self-pay

## 2016-02-13 NOTE — Telephone Encounter (Signed)
Pt called and wanted to know if we have received anything form [facility] regarding medication. Pt is to to them and needs some medication refilled. He needs doxazosin (CARDURA) 2 MG tablet andfinasteride (PROSCAR) 5 MG tablet. He would like someone to call him. Thank you!   Call pt @ [phone]

## 2016-02-13 NOTE — Telephone Encounter (Signed)
cardura was filled on 08/15/15 #90 +1, finasteride was filled on 05/20/15 #90 +2.  Last OV 12/26/15, Ok to refill?

## 2016-02-14 ENCOUNTER — Other Ambulatory Visit: Payer: Self-pay

## 2016-02-14 MED ORDER — FINASTERIDE 5 MG PO TABS: 5.0000 mg | ORAL_TABLET | Freq: Every day | ORAL | 1 refills | Status: DC

## 2016-02-14 MED ORDER — DOXAZOSIN MESYLATE 2 MG PO TABS: 2.0000 mg | ORAL_TABLET | Freq: Every day | ORAL | 1 refills | Status: DC

## 2016-02-15 ENCOUNTER — Ambulatory Visit: Payer: Medicare Other | Admitting: Urology

## 2016-02-15 VITALS — BP 147/70 | HR 72 | Wt 193.0 lb

## 2016-02-15 DIAGNOSIS — N471 Phimosis: Secondary | ICD-10-CM | POA: Diagnosis not present

## 2016-02-15 NOTE — Progress Notes (Signed)
   02/15/16  CC:  Chief Complaint  Patient presents with  . Procedure    Circumcision    HPI: 82 year old male with severe phimosis who presents today for circumcision. Consent was previously reviewed and signed. All his questions were answered prior to the procedure today.  Blood pressure (!) 147/70, pulse 72, weight 193 lb (87.5 kg). NED. A&Ox3.   No respiratory distress   Phimotic foreskin which is mildly inflamed   Circumcision Procedure   Informed consistent obtained.  Risks discussed.  He was then prepped and draped in a sterile fashion.    Pre-Procedure: -A dorsal penile and ring block were administered using 20 cc 1% lidocaine.  The surgical site was then tested and adequate anesthesia was achieved.    Procedure: -Unable to fully retract foreskin, therefore a dorsal slit was performed and the skin was retracted. Additional Betadine solution was applied to the glans. -Two circumferential incision were made on the penile skin, one in the mid shaft and another approximately 1 cm proximal to the coronal margin using a blade. -The foreskin was then removed in a sleeve-like fashion with care taken to avoid injury to the urethra.  - Bleeding points were cauterized and adequate hemostasis was achieved - Skin margins were reapproximated with interrupted 3-0 chromic catgut sutures. -A dressing of Xeroform and gauze was applied.     Post-Procedure: -RTC in 4 weeks for wound check   Assessment/ Plan:  1. Phimosis S/p circ F/u in 4 weeks for wound check  Hollice Espy, MD

## 2016-02-22 ENCOUNTER — Telehealth: Payer: Self-pay

## 2016-02-22 NOTE — Telephone Encounter (Signed)
As long as it does not pass, no swelling or redness, its probably fine. If he is concerned, he can come in for a wound check.  Hollice Espy, MD

## 2016-02-22 NOTE — Telephone Encounter (Signed)
Pt called stating he is currently having clear drainage from circ site. Pt stated that he originally had blood draining but that has since gone away. Pt declines n/v, f/c, warmth at site, or increased pain. Please advise.

## 2016-02-22 NOTE — Telephone Encounter (Signed)
Spoke with pt in reference to drainage. Made aware as long as there is no puss, swelling, or redness everything is ok. Pt stated "well I am having puss at the site. That is what I call drainage." Per Dr. Erlene Quan pt was added to her schedule tomorrow at 10:00.

## 2016-02-23 ENCOUNTER — Encounter: Payer: Self-pay | Admitting: Urology

## 2016-02-23 ENCOUNTER — Ambulatory Visit: Payer: Medicare Other | Admitting: Urology

## 2016-02-23 VITALS — BP 151/67 | HR 84 | Ht 67.0 in | Wt 196.9 lb

## 2016-02-23 DIAGNOSIS — Z9889 Other specified postprocedural states: Secondary | ICD-10-CM

## 2016-02-23 DIAGNOSIS — Z961 Presence of intraocular lens: Secondary | ICD-10-CM | POA: Diagnosis not present

## 2016-02-23 DIAGNOSIS — N471 Phimosis: Secondary | ICD-10-CM

## 2016-02-23 NOTE — Progress Notes (Signed)
02/23/2016 9:48 AM   Brayton Caves 1927-10-29 [id number]  Referring provider: Crecencio Mc, MD [place], [place] [address]  Chief Complaint  Patient presents with  . Drainage from Incision    HPI: 82 year old male who presents today for postoperative evaluation following circumcision in the office on 04/27/2015. Patient notes over the past couple of days, he is experienced increasing drainage of thin clear fluid. He is wearing a pad to protect his underwear. He was previously slightly bloody but this has cleared. He denies any redness, fevers, or chills.  He denies any issues voiding.  He does note that he's had increased penile sensitivity of the head of his penis.   PMH: Past Medical History:  Diagnosis Date  . Anxiety   . Arthritis   . BPH (benign prostatic hypertrophy)   . DVT (deep vein thrombosis) in pregnancy ([place])   . Presbyacusis    right sided hearing aid    Surgical History: Past Surgical History:  Procedure Laterality Date  . EYE SURGERY     cataract surgery  . HERNIA REPAIR    . JOINT REPLACEMENT  1953   ankle crush injury, right  . PROSTATE SURGERY     biopsy   . TONSILLECTOMY AND ADENOIDECTOMY  1936  . TOTAL HIP ARTHROPLASTY  2007   left , Dr Cay Schillings    Home Medications:    Medication List       Accurate as of 02/23/16  9:48 AM. Always use your most recent med list.          aspirin 81 MG tablet Take 81 mg by mouth daily.   doxazosin 2 MG tablet Commonly known as:  CARDURA Take 1 tablet (2 mg total) by mouth at bedtime.   finasteride 5 MG tablet Commonly known as:  PROSCAR Take 1 tablet (5 mg total) by mouth daily.   Magnesium 250 MG Tabs Take 1 tablet by mouth daily.   MIRALAX powder Generic drug:  polyethylene glycol powder Take 17 g by mouth every 3 (three) days.   multivitamin tablet Take 1 tablet by mouth daily.   ranitidine 75 MG tablet Commonly known as:  ZANTAC Take 75 mg by mouth 2  (two) times daily.       Allergies: No Known Allergies  Family History: Family History  Problem Relation Age of Onset  . Arthritis Mother   . Kidney cancer Neg Hx   . Kidney disease Neg Hx   . Prostate cancer Neg Hx     Social History:  reports that he quit smoking about 61 years ago. He has never used smokeless tobacco. He reports that he does not drink alcohol or use drugs.  ROS: UROLOGY Frequent Urination?: No Hard to postpone urination?: No Burning/pain with urination?: No Get up at night to urinate?: No Leakage of urine?: No Urine stream starts and stops?: No Trouble starting stream?: No Do you have to strain to urinate?: No Blood in urine?: No Urinary tract infection?: No Sexually transmitted disease?: No Injury to kidneys or bladder?: No Painful intercourse?: No Weak stream?: No Erection problems?: No Penile pain?: No  Gastrointestinal Nausea?: No Vomiting?: No Indigestion/heartburn?: No Diarrhea?: No Constipation?: No  Constitutional Fever: No Night sweats?: No Weight loss?: No Fatigue?: No  Skin Skin rash/lesions?: No Itching?: No  Eyes Blurred vision?: No Double vision?: No  Ears/Nose/Throat Sore throat?: No Sinus problems?: No  Hematologic/Lymphatic Swollen glands?: No Easy bruising?: No  Cardiovascular Leg  swelling?: No Chest pain?: No  Respiratory Cough?: No Shortness of breath?: No  Endocrine Excessive thirst?: No  Musculoskeletal Back pain?: No Joint pain?: No  Neurological Headaches?: No Dizziness?: No  Psychologic Depression?: No Anxiety?: No  Physical Exam: BP (!) 151/67 (BP Location: Left Arm, Patient Position: Sitting, Cuff Size: Normal)   Pulse 84   Ht 5\' 7"  (1.702 m)   Wt 196 lb 14.4 oz (89.3 kg)   BMI 30.84 kg/m   Constitutional:  Alert and oriented, No acute distress. HEENT: [place] AT, moist mucus membranes.  Trachea midline, no masses. Cardiovascular: No clubbing, cyanosis, or edema. Respiratory:  Normal respiratory effort, no increased work of breathing. GI: Abdomen is soft, nontender, nondistended, no abdominal masses GU: Circumcised phallus with sutures still intact. Slightly erythematous glands with few plaque-like lesions. Serous drainage noted on panty liner but no evidence of purulence, significant erythema, or edema. No concern for wound infection. Bilateral descended testicles. Skin: No rashes, bruises or suspicious lesions. Neurologic: Grossly intact, no focal deficits, moving all 4 extremities. Psychiatric: Normal mood and affect.  Laboratory Data: Lab Results  Component Value Date   WBC 9.2 05/30/2015   HGB 14.5 05/30/2015   HCT 44.6 05/30/2015   MCV 87.7 05/30/2015   PLT 252.0 05/30/2015    Lab Results  Component Value Date   CREATININE 1.06 12/26/2015    Lab Results  Component Value Date   HGBA1C 5.4 12/26/2015      Assessment & Plan:    1. Phimosis S/p circ 1 weeks ago  2. H/O circumcision Scant thin clear drainage but no evidence of infection warning symptoms are reviewed today in detail Inflammation of glans noted, will reassess at next visit  F/u 3 weeks for wound check as previously scheduled  Hollice Espy, MD  [facility] [address] [place], [place] [address] [phone]

## 2016-03-14 ENCOUNTER — Ambulatory Visit (INDEPENDENT_AMBULATORY_CARE_PROVIDER_SITE_OTHER): Payer: Medicare Other | Admitting: Urology

## 2016-03-14 VITALS — BP 160/74 | HR 79 | Ht 68.0 in | Wt 194.0 lb

## 2016-03-14 DIAGNOSIS — N471 Phimosis: Secondary | ICD-10-CM

## 2016-03-14 DIAGNOSIS — Z9889 Other specified postprocedural states: Secondary | ICD-10-CM

## 2016-03-14 NOTE — Progress Notes (Signed)
03/14/2016 3:44 PM   Brayton Caves 01/30/1928 [id number]  Referring provider: Crecencio Mc, MD [place], [place] [address]  Chief Complaint  Patient presents with  . Routine Post Op    44month    HPI: 82 yo M s/p circumcision on 02/15/16 for severe phimosis.     Incision well healed.  No further drainage.  Pleased with cosmesis.  No penile sensitivity.   PMH: Past Medical History:  Diagnosis Date  . Anxiety   . Arthritis   . BPH (benign prostatic hypertrophy)   . DVT (deep vein thrombosis) in pregnancy ([place])   . Presbyacusis    right sided hearing aid    Surgical History: Past Surgical History:  Procedure Laterality Date  . EYE SURGERY     cataract surgery  . HERNIA REPAIR    . JOINT REPLACEMENT  1953   ankle crush injury, right  . PROSTATE SURGERY     biopsy   . TONSILLECTOMY AND ADENOIDECTOMY  1936  . TOTAL HIP ARTHROPLASTY  2007   left , Dr Cay Schillings    Home Medications:    Medication List       Accurate as of 03/14/16  3:44 PM. Always use your most recent med list.          aspirin 81 MG tablet Take 81 mg by mouth daily.   doxazosin 2 MG tablet Commonly known as:  CARDURA Take 1 tablet (2 mg total) by mouth at bedtime.   finasteride 5 MG tablet Commonly known as:  PROSCAR Take 1 tablet (5 mg total) by mouth daily.   Magnesium 250 MG Tabs Take 1 tablet by mouth daily.   MIRALAX powder Generic drug:  polyethylene glycol powder Take 17 g by mouth every 3 (three) days.   multivitamin tablet Take 1 tablet by mouth daily.   ranitidine 75 MG tablet Commonly known as:  ZANTAC Take 75 mg by mouth 2 (two) times daily.       Allergies: No Known Allergies  Family History: Family History  Problem Relation Age of Onset  . Arthritis Mother   . Kidney cancer Neg Hx   . Kidney disease Neg Hx   . Prostate cancer Neg Hx     Social History:  reports that he quit smoking about 61 years ago. He has never used  smokeless tobacco. He reports that he does not drink alcohol or use drugs.  ROS: UROLOGY Frequent Urination?: No Hard to postpone urination?: No Burning/pain with urination?: No Get up at night to urinate?: No Leakage of urine?: No Urine stream starts and stops?: No Trouble starting stream?: No Do you have to strain to urinate?: No Blood in urine?: No Urinary tract infection?: No Sexually transmitted disease?: No Injury to kidneys or bladder?: No Painful intercourse?: No Weak stream?: No Erection problems?: No Penile pain?: No  Gastrointestinal Nausea?: No Vomiting?: No Indigestion/heartburn?: No Diarrhea?: No Constipation?: No  Constitutional Fever: No Night sweats?: No Weight loss?: No Fatigue?: No  Skin Skin rash/lesions?: No Itching?: No  Eyes Blurred vision?: No Double vision?: No  Ears/Nose/Throat Sore throat?: No  Hematologic/Lymphatic Swollen glands?: No Easy bruising?: No  Cardiovascular Leg swelling?: No Chest pain?: No  Respiratory Cough?: No Shortness of breath?: No  Endocrine Excessive thirst?: No  Musculoskeletal Back pain?: No Joint pain?: No  Neurological Headaches?: No Dizziness?: No  Psychologic Depression?: No Anxiety?: No  Physical Exam: BP (!) 160/74   Pulse 79   Ht  5\' 8"  (1.727 m)   Wt 194 lb (88 kg)   BMI 29.50 kg/m   Constitutional:  Alert and oriented, No acute distress. HEENT: [place] AT, moist mucus membranes.  Trachea midline, no masses. Cardiovascular: No clubbing, cyanosis, or edema. Respiratory: Normal respiratory effort, no increased work of breathing. GI: Abdomen is soft, nontender, nondistended, no abdominal masses GU: Well healed circumcision incision, no adhesion or drainage. Skin: No rashes, bruises or suspicious lesions. Neurologic: Grossly intact, no focal deficits, moving all 4 extremities. Psychiatric: Normal mood and affect.  Laboratory Data: Lab Results  Component Value Date   WBC 9.2  05/30/2015   HGB 14.5 05/30/2015   HCT 44.6 05/30/2015   MCV 87.7 05/30/2015   PLT 252.0 05/30/2015    Lab Results  Component Value Date   CREATININE 1.06 12/26/2015    Lab Results  Component Value Date   HGBA1C 5.4 12/26/2015     Assessment & Plan:    1. Phimosis S/p circ  2. H/O circumcision Well healed, no post op issues   Return if symptoms worsen or fail to improve.  Hollice Espy, MD  [facility] [address], [place], [place] [address] [phone]

## 2016-04-07 ENCOUNTER — Other Ambulatory Visit: Payer: Self-pay | Admitting: Internal Medicine

## 2016-06-25 ENCOUNTER — Ambulatory Visit: Payer: Medicare Other | Admitting: Internal Medicine

## 2016-06-25 ENCOUNTER — Telehealth: Payer: Self-pay | Admitting: Internal Medicine

## 2016-06-25 NOTE — Telephone Encounter (Signed)
Pt called at 8:28. Stating that he was running late. Reschedule his appt for tomorrow at 3 pm.

## 2016-06-25 NOTE — Telephone Encounter (Signed)
FYI

## 2016-06-26 ENCOUNTER — Ambulatory Visit (INDEPENDENT_AMBULATORY_CARE_PROVIDER_SITE_OTHER): Payer: Medicare Other | Admitting: Internal Medicine

## 2016-06-26 ENCOUNTER — Encounter: Payer: Self-pay | Admitting: Internal Medicine

## 2016-06-26 VITALS — BP 160/84 | HR 84 | Resp 16 | Ht 68.0 in | Wt 194.6 lb

## 2016-06-26 DIAGNOSIS — I10 Essential (primary) hypertension: Secondary | ICD-10-CM | POA: Diagnosis not present

## 2016-06-26 LAB — COMPREHENSIVE METABOLIC PANEL
ALBUMIN: 4.2 g/dL (ref 3.5–5.2)
ALK PHOS: 60 U/L (ref 39–117)
ALT: 13 U/L (ref 0–53)
AST: 17 U/L (ref 0–37)
BUN: 16 mg/dL (ref 6–23)
CALCIUM: 10.2 mg/dL (ref 8.4–10.5)
CO2: 26 mEq/L (ref 19–32)
CREATININE: 1.09 mg/dL (ref 0.40–1.50)
Chloride: 104 mEq/L (ref 96–112)
GFR: 68 mL/min (ref >60.00)
Glucose, Bld: 103 mg/dL — ABNORMAL HIGH (ref 70–99)
POTASSIUM: 4.4 meq/L (ref 3.5–5.1)
SODIUM: 139 meq/L (ref 135–145)
TOTAL PROTEIN: 7.7 g/dL (ref 6.0–8.3)
Total Bilirubin: 0.7 mg/dL (ref 0.2–1.2)

## 2016-06-26 LAB — MICROALBUMIN / CREATININE URINE RATIO
CREATININE, U: 56 mg/dL
Microalb Creat Ratio: 1.3 mg/g (ref 0.0–30.0)
Microalb, Ur: <0.7 mg/dL (ref 0.0–1.9)

## 2016-06-26 MED ORDER — METOPROLOL SUCCINATE ER 25 MG PO TB24: 25.0000 mg | ORAL_TABLET | Freq: Every day | ORAL | 3 refills | Status: DC

## 2016-06-26 NOTE — Assessment & Plan Note (Addendum)
elevated despite resuming t cardura. Adding metoprolol xl 25 mg daily.  RTC 2 wees  For RN blood pressure check.   Lab Results  Component Value Date   CREATININE 1.09 06/26/2016   Lab Results  Component Value Date   NA 139 06/26/2016   K 4.4 06/26/2016   CL 104 06/26/2016   CO2 26 06/26/2016

## 2016-06-26 NOTE — Progress Notes (Signed)
Pre visit review using our clinic review tool, if applicable. No additional management support is needed unless otherwise documented below in the visit note. 

## 2016-06-26 NOTE — Patient Instructions (Addendum)
I AM  ADDING A SECOND MEDICATION FOR YOUR BLOOD PRESSURE CALLED METOPROLOL  XL    TAKE IT ONCE DAILY  EVERY DAY .  YOU CAN TAKE IT WITH YOUR OTHER MEDICATIONS  RETURN IN 2 WEEKS FOR A BP CHECK BY OUR NURSE

## 2016-06-26 NOTE — Progress Notes (Signed)
FOLLOW YUP ON HYPERTENSION    Subjective:  Patient ID: Corey Todd, male    DOB: Apr 02, 1928  Age: 83 y.o. MRN: [id number]  CC: The primary encounter diagnosis was Uncontrolled hypertension. A diagnosis of Essential hypertension was also pertinent to this visit.  HPI Corey Todd presents for FOLLOW UP ON HYPERTENSION   Patient is taking his medications as prescribed and notes no adverse effects.  Home BP readings have been done about once per week and are  generally  > 130/80 .  hhe is avoiding added salt in her diet and walking regularly about 3 times per week for exercise  .TAKING CARDURA   Outpatient Medications Prior to Visit  Medication Sig Dispense Refill  . aspirin 81 MG tablet Take 81 mg by mouth daily.    Marland Kitchen doxazosin (CARDURA) 2 MG tablet Take 1 tablet (2 mg total) by mouth at bedtime. 90 tablet 1  . finasteride (PROSCAR) 5 MG tablet Take 1 tablet (5 mg total) by mouth daily. 90 tablet 1  . Magnesium 250 MG TABS Take 1 tablet by mouth daily.    . Multiple Vitamin (MULTIVITAMIN) tablet Take 1 tablet by mouth daily.    . polyethylene glycol powder (MIRALAX) powder Take 17 g by mouth every 3 (three) days.    . ranitidine (ZANTAC) 75 MG tablet Take 75 mg by mouth 2 (two) times daily.     No facility-administered medications prior to visit.     Review of Systems;  Patient denies headache, fevers, malaise, unintentional weight loss, skin rash, eye pain, sinus congestion and sinus pain, sore throat, dysphagia,  hemoptysis , cough, dyspnea, wheezing, chest pain, palpitations, orthopnea, edema, abdominal pain, nausea, melena, diarrhea, constipation, flank pain, dysuria, hematuria, urinary  Frequency, nocturia, numbness, tingling, seizures,  Focal weakness, Loss of consciousness,  Tremor, insomnia, depression, anxiety, and suicidal ideation.      Objective:  BP (!) 160/84 (BP Location: Left Arm, Patient Position: Sitting, Cuff Size: Normal)   Pulse 84   Resp 16   Ht 5\' 8"   (1.727 m)   Wt 194 lb 9.6 oz (88.3 kg)   SpO2 94%   BMI 29.59 kg/m   BP Readings from Last 3 Encounters:  06/26/16 (!) 160/84  03/14/16 (!) 160/74  02/23/16 (!) 151/67    Wt Readings from Last 3 Encounters:  06/26/16 194 lb 9.6 oz (88.3 kg)  03/14/16 194 lb (88 kg)  02/23/16 196 lb 14.4 oz (89.3 kg)    General appearance: alert, cooperative and appears stated age Ears: normal TM's and external ear canals both ears Throat: lips, mucosa, and tongue normal; teeth and gums normal Neck: no adenopathy, no carotid bruit, supple, symmetrical, trachea midline and thyroid not enlarged, symmetric, no tenderness/mass/nodules Back: symmetric, no curvature. ROM normal. No CVA tenderness. Lungs: clear to auscultation bilaterally Heart: regular rate and rhythm, S1, S2 normal, no murmur, click, rub or gallop Abdomen: soft, non-tender; bowel sounds normal; no masses,  no organomegaly Pulses: 2+ and symmetric Skin: Skin color, texture, turgor normal. No rashes or lesions Lymph nodes: Cervical, supraclavicular, and axillary nodes normal.  Lab Results  Component Value Date   HGBA1C 5.4 12/26/2015    Lab Results  Component Value Date   CREATININE 1.09 06/26/2016   CREATININE 1.06 12/26/2015   CREATININE 1.09 05/30/2015    Lab Results  Component Value Date   WBC 9.2 05/30/2015   HGB 14.5 05/30/2015   HCT 44.6 05/30/2015   PLT 252.0 05/30/2015   GLUCOSE  103 (H) 06/26/2016   CHOL 146 11/21/2012   TRIG 108.0 11/21/2012   HDL 48.80 11/21/2012   LDLCALC 76 11/21/2012   ALT 13 06/26/2016   AST 17 06/26/2016   NA 139 06/26/2016   K 4.4 06/26/2016   CL 104 06/26/2016   CREATININE 1.09 06/26/2016   BUN 16 06/26/2016   CO2 26 06/26/2016   TSH 3.60 05/30/2015   HGBA1C 5.4 12/26/2015   MICROALBUR <0.7 06/26/2016    No results found.  Assessment & Plan:   Problem List Items Addressed This Visit    Hypertension    elevated despite resuming t cardura. Adding metoprolol xl 25 mg  daily.  RTC 2 wees  For RN blood pressure check.   Lab Results  Component Value Date   CREATININE 1.09 06/26/2016   Lab Results  Component Value Date   NA 139 06/26/2016   K 4.4 06/26/2016   CL 104 06/26/2016   CO2 26 06/26/2016         Relevant Medications   metoprolol succinate (TOPROL-XL) 25 MG 24 hr tablet    Other Visit Diagnoses    Uncontrolled hypertension    -  Primary   Relevant Medications   metoprolol succinate (TOPROL-XL) 25 MG 24 hr tablet   Other Relevant Orders   Comprehensive metabolic panel (Completed)   Microalbumin / creatinine urine ratio (Completed)     A total of 25 minutes was spent with patient more than half of which was spent in counseling patient on the above mentioned issues , reviewing and explaining recent labs and imaging studies done, and coordination of care. I am having Mr. Ducharme start on metoprolol succinate. I am also having him maintain his Magnesium, multivitamin, aspirin, polyethylene glycol powder, ranitidine, doxazosin, and finasteride.  Meds ordered this encounter  Medications  . metoprolol succinate (TOPROL-XL) 25 MG 24 hr tablet    Sig: Take 1 tablet (25 mg total) by mouth daily.    Dispense:  90 tablet    Refill:  3    There are no discontinued medications.  Follow-up: Return in about 2 weeks (around 07/10/2016) for bp check RN VISIT   .   Crecencio Mc, MD

## 2016-06-28 ENCOUNTER — Encounter: Payer: Self-pay | Admitting: Internal Medicine

## 2016-07-10 ENCOUNTER — Ambulatory Visit (INDEPENDENT_AMBULATORY_CARE_PROVIDER_SITE_OTHER): Payer: Medicare Other | Admitting: *Deleted

## 2016-07-10 ENCOUNTER — Encounter: Payer: Self-pay | Admitting: *Deleted

## 2016-07-10 VITALS — BP 126/70 | HR 66 | Resp 12

## 2016-07-10 DIAGNOSIS — I10 Essential (primary) hypertension: Secondary | ICD-10-CM

## 2016-07-10 NOTE — Progress Notes (Addendum)
Patient presented for BP 2 weeks post visit from addition metoprolol 25 mg 1 tablet daily, patient BP on 06/26/16 160/84. Patient BP attained in left arm 130/70 pulse 66 after additional 8 minute wait BP attained in right arm  126/70 pulse 70.   Reviewed.  Blood pressure improved.  Will forward for Dr Lupita Dawn review.   Dr [name]

## 2016-07-12 NOTE — Telephone Encounter (Signed)
Mailed unread message to patient.  

## 2016-08-13 ENCOUNTER — Other Ambulatory Visit: Payer: Self-pay | Admitting: Internal Medicine

## 2016-12-21 ENCOUNTER — Ambulatory Visit (INDEPENDENT_AMBULATORY_CARE_PROVIDER_SITE_OTHER): Payer: Medicare Other

## 2016-12-21 DIAGNOSIS — Z23 Encounter for immunization: Secondary | ICD-10-CM | POA: Diagnosis not present

## 2017-02-13 ENCOUNTER — Ambulatory Visit (INDEPENDENT_AMBULATORY_CARE_PROVIDER_SITE_OTHER): Payer: Medicare Other | Admitting: Internal Medicine

## 2017-02-13 ENCOUNTER — Encounter: Payer: Self-pay | Admitting: Internal Medicine

## 2017-02-13 VITALS — BP 134/76 | HR 84 | Temp 97.9°F | Resp 15 | Ht 68.0 in | Wt 195.6 lb

## 2017-02-13 DIAGNOSIS — I10 Essential (primary) hypertension: Secondary | ICD-10-CM | POA: Diagnosis not present

## 2017-02-13 DIAGNOSIS — E669 Obesity, unspecified: Secondary | ICD-10-CM

## 2017-02-13 DIAGNOSIS — E559 Vitamin D deficiency, unspecified: Secondary | ICD-10-CM | POA: Diagnosis not present

## 2017-02-13 DIAGNOSIS — Z0000 Encounter for general adult medical examination without abnormal findings: Secondary | ICD-10-CM

## 2017-02-13 NOTE — Patient Instructions (Addendum)
Try taking Colace (available as generic docusate ) 100 mg stool softener daily with your miralax  to manage your constipation better.   You are doing well!  I'll see you in 6 months   Health Maintenance, Male A healthy lifestyle and preventive care is important for your health and wellness. Ask your health care provider about what schedule of regular examinations is right for you. What should I know about weight and diet? Eat a Healthy Diet  Eat plenty of vegetables, fruits, whole grains, low-fat dairy products, and lean protein.  Do not eat a lot of foods high in solid fats, added sugars, or salt.  Maintain a Healthy Weight Regular exercise can help you achieve or maintain a healthy weight. You should:  Do at least 150 minutes of exercise each week. The exercise should increase your heart rate and make you sweat (moderate-intensity exercise).  Do strength-training exercises at least twice a week.  Watch Your Levels of Cholesterol and Blood Lipids  Have your blood tested for lipids and cholesterol every 5 years starting at 81 years of age. If you are at high risk for heart disease, you should start having your blood tested when you are 81 years old. You may need to have your cholesterol levels checked more often if: ? Your lipid or cholesterol levels are high. ? You are older than 81 years of age. ? You are at high risk for heart disease.  What should I know about cancer screening? Many types of cancers can be detected early and may often be prevented. Lung Cancer  You should be screened every year for lung cancer if: ? You are a current smoker who has smoked for at least 30 years. ? You are a former smoker who has quit within the past 15 years.  Talk to your health care provider about your screening options, when you should start screening, and how often you should be screened.  Colorectal Cancer  Routine colorectal cancer screening usually begins at 81 years of age and  should be repeated every 5-10 years until you are 81 years old. You may need to be screened more often if early forms of precancerous polyps or small growths are found. Your health care provider may recommend screening at an earlier age if you have risk factors for colon cancer.  Your health care provider may recommend using home test kits to check for hidden blood in the stool.  A small camera at the end of a tube can be used to examine your colon (sigmoidoscopy or colonoscopy). This checks for the earliest forms of colorectal cancer.  Prostate and Testicular Cancer  Depending on your age and overall health, your health care provider may do certain tests to screen for prostate and testicular cancer.  Talk to your health care provider about any symptoms or concerns you have about testicular or prostate cancer.  Skin Cancer  Check your skin from head to toe regularly.  Tell your health care provider about any new moles or changes in moles, especially if: ? There is a change in a mole's size, shape, or color. ? You have a mole that is larger than a pencil eraser.  Always use sunscreen. Apply sunscreen liberally and repeat throughout the day.  Protect yourself by wearing long sleeves, pants, a wide-brimmed hat, and sunglasses when outside.  What should I know about heart disease, diabetes, and high blood pressure?  If you are 62-30 years of age, have your blood pressure checked every  3-5 years. If you are 103 years of age or older, have your blood pressure checked every year. You should have your blood pressure measured twice-once when you are at a hospital or clinic, and once when you are not at a hospital or clinic. Record the average of the two measurements. To check your blood pressure when you are not at a hospital or clinic, you can use: ? An automated blood pressure machine at a pharmacy. ? A home blood pressure monitor.  Talk to your health care provider about your target blood  pressure.  If you are between 68-56 years old, ask your health care provider if you should take aspirin to prevent heart disease.  Have regular diabetes screenings by checking your fasting blood sugar level. ? If you are at a normal weight and have a low risk for diabetes, have this test once every three years after the age of 61. ? If you are overweight and have a high risk for diabetes, consider being tested at a younger age or more often.  A one-time screening for abdominal aortic aneurysm (AAA) by ultrasound is recommended for men aged 34-75 years who are current or former smokers. What should I know about preventing infection? Hepatitis B If you have a higher risk for hepatitis B, you should be screened for this virus. Talk with your health care provider to find out if you are at risk for hepatitis B infection. Hepatitis C Blood testing is recommended for:  Everyone born from [birth year] through 1963.  Anyone with known risk factors for hepatitis C.  Sexually Transmitted Diseases (STDs)  You should be screened each year for STDs including gonorrhea and chlamydia if: ? You are sexually active and are younger than 81 years of age. ? You are older than 81 years of age and your health care provider tells you that you are at risk for this type of infection. ? Your sexual activity has changed since you were last screened and you are at an increased risk for chlamydia or gonorrhea. Ask your health care provider if you are at risk.  Talk with your health care provider about whether you are at high risk of being infected with HIV. Your health care provider may recommend a prescription medicine to help prevent HIV infection.  What else can I do?  Schedule regular health, dental, and eye exams.  Stay current with your vaccines (immunizations).  Do not use any tobacco products, such as cigarettes, chewing tobacco, and e-cigarettes. If you need help quitting, ask your health care  provider.  Limit alcohol intake to no more than 2 drinks per day. One drink equals 12 ounces of beer, 5 ounces of wine, or 1 ounces of hard liquor.  Do not use street drugs.  Do not share needles.  Ask your health care provider for help if you need support or information about quitting drugs.  Tell your health care provider if you often feel depressed.  Tell your health care provider if you have ever been abused or do not feel safe at home. This information is not intended to replace advice given to you by your health care provider. Make sure you discuss any questions you have with your health care provider. Document Released: 09/29/2007 Document Revised: 11/30/2015 Document Reviewed: 01/04/2015 Elsevier Interactive Patient Education  [employer].

## 2017-02-13 NOTE — Progress Notes (Signed)
Patient ID: Corey Todd, male    DOB: [birth year]  Age: 83 y.o. MRN: [id number]  The patient is here for annual  preventive  examination and management of other chronic and acute problems.   The risk factors are reflected in the social history.  The roster of all physicians providing medical care to patient - is listed in the Snapshot section of the chart.  Activities of daily living:  The patient is 100% independent in all ADLs: dressing, toileting, feeding as well as independent mobility  Home safety : The patient has smoke detectors in the home. They wear seatbelts.  There are no firearms at home. There is no violence in the home.   There is no risks for hepatitis, STDs or HIV. There is no   history of blood transfusion. They have no travel history to infectious disease endemic areas of the world.  The patient has seen their dentist in the last six month. He has seen their eye doctor in the last year. He wear hearing aids bilaterally .  They do not  have excessive sun exposure. Discussed the need for sun protection: hats, long sleeves and use of sunscreen if there is significant sun exposure.   Diet: the importance of a healthy diet is discussed. They do have a healthy diet.  The benefits of regular aerobic exercise were discussed. His be does not walk regularly.  HIs back aches with mowing so he has stopped doing it .  Still vacuums. And does the household chores since his wife 's back pain prevents her from participating.   Takes miralax daily and a glycerin suppository  .  Eats take out regularly ,  [place] , hibachi,  Has a green salad and vegetable daily   Takes an hour nap daily,  Falls asleep if  Reading mroe than 5 minutes   Depression screen: there are no signs or vegative symptoms of depression- irritability, change in appetite, anhedonia, sadness/tearfullness.  Cognitive assessment: the patient manages all their financial and personal affairs and is actively engaged. They  could relate day,date,year and events; recalled 2/3 objects at 3 minutes; performed clock-face test normally.  The following portions of the patient's history were reviewed and updated as appropriate: allergies, current medications, past family history, past medical history,  past surgical history, past social history  and problem list.  Visual acuity was not assessed per patient preference since she has regular follow up with her ophthalmologist. Hearing and body mass index were assessed and reviewed.   During the course of the visit the patient was educated and counseled about appropriate screening and preventive services including : fall prevention , diabetes screening, nutrition counseling, colorectal cancer screening, and recommended immunizations.    CC: The primary encounter diagnosis was Essential hypertension. Diagnoses of Vitamin D deficiency, Encounter for preventive health examination, and Obesity (BMI 30-39.9) were also pertinent to this visit.  History Zadrian has a past medical history of Anxiety; Arthritis; BPH (benign prostatic hypertrophy); DVT (deep vein thrombosis) in pregnancy ([place]); and Presbyacusis.   He has a past surgical history that includes Prostate surgery; Joint replacement (1953); Tonsillectomy and adenoidectomy (1936); Total hip arthroplasty (2007); Eye surgery; and Hernia repair.   His family history includes Arthritis in his mother.He reports that he quit smoking about 62 years ago. He has never used smokeless tobacco. He reports that he does not drink alcohol or use drugs.  Outpatient Medications Prior to Visit  Medication Sig Dispense Refill  . aspirin 81 MG  tablet Take 81 mg by mouth daily.    Marland Kitchen doxazosin (CARDURA) 2 MG tablet TAKE 1 TABLET BY MOUTH AT  BEDTIME 90 tablet 1  . finasteride (PROSCAR) 5 MG tablet TAKE 1 TABLET BY MOUTH  DAILY 90 tablet 1  . Magnesium 250 MG TABS Take 1 tablet by mouth daily.    . Multiple Vitamin (MULTIVITAMIN) tablet Take 1  tablet by mouth daily.    . polyethylene glycol powder (MIRALAX) powder Take 17 g by mouth every 3 (three) days.    . ranitidine (ZANTAC) 75 MG tablet Take 75 mg by mouth 2 (two) times daily.    . metoprolol succinate (TOPROL-XL) 25 MG 24 hr tablet Take 1 tablet (25 mg total) by mouth daily. (Patient not taking: Reported on 02/13/2017) 90 tablet 3   No facility-administered medications prior to visit.     Review of Systems   Patient denies headache, fevers, malaise, unintentional weight loss, skin rash, eye pain, sinus congestion and sinus pain, sore throat, dysphagia,  hemoptysis , cough, dyspnea, wheezing, chest pain, palpitations, orthopnea, edema, abdominal pain, nausea, melena, diarrhea, constipation, flank pain, dysuria, hematuria, urinary  Frequency, nocturia, numbness, tingling, seizures,  Focal weakness, Loss of consciousness,  Tremor, insomnia, depression, anxiety, and suicidal ideation.      Objective:  BP 134/76 (BP Location: Left Arm, Patient Position: Sitting, Cuff Size: Normal)   Pulse 84   Temp 97.9 F (36.6 C) (Oral)   Resp 15   Ht 5\' 8"  (1.727 m)   Wt 195 lb 9.6 oz (88.7 kg)   SpO2 96%   BMI 29.74 kg/m   Physical Exam   General appearance: alert, cooperative and appears stated age Ears: normal TM's and external ear canals both ears Throat: lips, mucosa, and tongue normal; teeth and gums normal Neck: no adenopathy, no carotid bruit, supple, symmetrical, trachea midline and thyroid not enlarged, symmetric, no tenderness/mass/nodules Back: symmetric, no curvature. ROM normal. No CVA tenderness. Lungs: clear to auscultation bilaterally Heart: regular rate and rhythm, S1, S2 normal, no murmur, click, rub or gallop Abdomen: soft, non-tender; bowel sounds normal; no masses,  no organomegaly Pulses: 2+ and symmetric Skin: Skin color, texture, turgor normal. No rashes or lesions Lymph nodes: Cervical, supraclavicular, and axillary nodes normal.    Assessment & Plan:    Problem List Items Addressed This Visit    Encounter for preventive health examination    Annual comprehensive preventive exam was done as well as an evaluation and management of acute and chronic conditions .  During the course of the visit the patient was educated and counseled about appropriate screening and preventive services including :  diabetes screening, lipid analysis with projected  10 year  risk for CAD , nutrition counseling, prostate and colorectal cancer screening, and recommended immunizations.  Printed recommendations for health maintenance screenings was given.       Hypertension - Primary    Well controlled on current regimen. Renal function stable, no changes today.  Lab Results  Component Value Date   CREATININE 1.10 02/13/2017   Lab Results  Component Value Date   NA 139 02/13/2017   K 4.3 02/13/2017   CL 106 02/13/2017   CO2 26 02/13/2017         Relevant Orders   Comprehensive metabolic panel (Completed)   Obesity (BMI 30-39.9)    I have addressed  BMI and recommended wt loss of 10% of body weight over the next 6 months using a low fat, low starch, high  protein  fruit/vegetable based Mediterranean diet and 30 minutes of aerobic exercise a minimum of 5 days per week.         Other Visit Diagnoses    Vitamin D deficiency       Relevant Orders   VITAMIN D 25 Hydroxy (Vit-D Deficiency, Fractures) (Completed)      I am having Mr. Beck maintain his Magnesium, multivitamin, aspirin, polyethylene glycol powder, ranitidine, metoprolol succinate, doxazosin, and finasteride.  No orders of the defined types were placed in this encounter.   There are no discontinued medications.  Follow-up: Return in about 6 months (around 08/13/2017).   Crecencio Mc, MD

## 2017-02-14 DIAGNOSIS — Z0000 Encounter for general adult medical examination without abnormal findings: Secondary | ICD-10-CM | POA: Insufficient documentation

## 2017-02-14 LAB — COMPREHENSIVE METABOLIC PANEL
ALT: 14 U/L (ref 0–53)
AST: 18 U/L (ref 0–37)
Albumin: 3.9 g/dL (ref 3.5–5.2)
Alkaline Phosphatase: 56 U/L (ref 39–117)
BUN: 16 mg/dL (ref 6–23)
CALCIUM: 9.2 mg/dL (ref 8.4–10.5)
CO2: 26 mEq/L (ref 19–32)
CREATININE: 1.1 mg/dL (ref 0.40–1.50)
Chloride: 106 mEq/L (ref 96–112)
GFR: 67.19 mL/min (ref >60.00)
GLUCOSE: 99 mg/dL (ref 70–99)
POTASSIUM: 4.3 meq/L (ref 3.5–5.1)
Sodium: 139 mEq/L (ref 135–145)
TOTAL PROTEIN: 7.1 g/dL (ref 6.0–8.3)
Total Bilirubin: 0.7 mg/dL (ref 0.2–1.2)

## 2017-02-14 LAB — VITAMIN D 25 HYDROXY (VIT D DEFICIENCY, FRACTURES): VITD: 29.4 ng/mL — ABNORMAL LOW (ref 30.00–100.00)

## 2017-02-14 NOTE — Assessment & Plan Note (Signed)

## 2017-02-14 NOTE — Assessment & Plan Note (Signed)
Well controlled on current regimen. Renal function stable, no changes today.  Lab Results  Component Value Date   CREATININE 1.10 02/13/2017   Lab Results  Component Value Date   NA 139 02/13/2017   K 4.3 02/13/2017   CL 106 02/13/2017   CO2 26 02/13/2017

## 2017-02-14 NOTE — Assessment & Plan Note (Signed)
I have addressed  BMI and recommended wt loss of 10% of body weight over the next 6 months using a low fat, low starch, high protein  fruit/vegetable based Mediterranean diet and 30 minutes of aerobic exercise a minimum of 5 days per week.   

## 2017-02-17 ENCOUNTER — Encounter: Payer: Self-pay | Admitting: Internal Medicine

## 2017-02-21 ENCOUNTER — Other Ambulatory Visit: Payer: Self-pay | Admitting: Internal Medicine

## 2017-03-04 ENCOUNTER — Encounter: Payer: Self-pay | Admitting: Internal Medicine

## 2017-03-04 NOTE — Progress Notes (Signed)
My chart message 02/17/17 mailed to patient.

## 2017-03-27 DIAGNOSIS — H353131 Nonexudative age-related macular degeneration, bilateral, early dry stage: Secondary | ICD-10-CM | POA: Diagnosis not present

## 2017-04-19 ENCOUNTER — Ambulatory Visit (INDEPENDENT_AMBULATORY_CARE_PROVIDER_SITE_OTHER): Payer: Medicare Other

## 2017-04-19 VITALS — BP 138/62 | HR 77 | Temp 98.2°F | Resp 15 | Ht 67.5 in | Wt 195.1 lb

## 2017-04-19 DIAGNOSIS — Z0000 Encounter for general adult medical examination without abnormal findings: Secondary | ICD-10-CM | POA: Diagnosis not present

## 2017-04-19 DIAGNOSIS — Z1331 Encounter for screening for depression: Secondary | ICD-10-CM

## 2017-04-19 NOTE — Patient Instructions (Addendum)
  Corey Todd , Thank you for taking time to come for your Medicare Wellness Visit. I appreciate your ongoing commitment to your health goals. Please review the following plan we discussed and let me know if I can assist you in the future.   Follow up with Dr. [name] as needed.    Have a great day!  These are the goals we discussed: Goals    . DIET - INCREASE WATER INTAKE    . Increase physical activity     Walk for exercise       This is a list of the screening recommended for you and due dates:  Health Maintenance  Topic Date Due  . Tetanus Vaccine  01/22/2024  . Flu Shot  Completed  . Pneumonia vaccines  Completed

## 2017-04-19 NOTE — Progress Notes (Signed)
Subjective:   Corey Todd is a 84 y.o. male who presents for Medicare Annual/Subsequent preventive examination.  Review of Systems:  No ROS.  Medicare Wellness Visit. Additional risk factors are reflected in the social history.  Cardiac Risk Factors include: advanced age (>22men, >87 women);hypertension;male gender     Objective:    Vitals: BP 138/62 (BP Location: Left Arm, Patient Position: Sitting, Cuff Size: Normal)   Pulse 77   Temp 98.2 F (36.8 C) (Oral)   Resp 15   Ht 5' 7.5" (1.715 m)   Wt 195 lb 1.9 oz (88.5 kg)   SpO2 96%   BMI 30.11 kg/m   Body mass index is 30.11 kg/m.  Advanced Directives 04/19/2017  Does Patient Have a Medical Advance Directive? Yes  Type of Advance Directive Out of facility DNR (pink MOST or yellow form);Healthcare Power of Attorney  Does patient want to make changes to medical advance directive? No - Patient declined  Copy of [place] in Chart? Yes    Tobacco Social History   Tobacco Use  Smoking Status Former Smoker  . Last attempt to quit: 04/16/1954  . Years since quitting: 63.0  Smokeless Tobacco Never Used     Counseling given: Not Answered   Clinical Intake:  Pre-visit preparation completed: Yes  Pain : No/denies pain     Nutritional Status: BMI 25 -29 Overweight Diabetes: No  How often do you need to have someone help you when you read instructions, pamphlets, or other written materials from your doctor or pharmacy?: 1 - Never  Interpreter Needed?: No     Past Medical History:  Diagnosis Date  . Anxiety   . Arthritis   . BPH (benign prostatic hypertrophy)   . DVT (deep vein thrombosis) in pregnancy ([place])   . Presbyacusis    right sided hearing aid   Past Surgical History:  Procedure Laterality Date  . EYE SURGERY     cataract surgery  . HERNIA REPAIR    . JOINT REPLACEMENT  1953   ankle crush injury, right  . PROSTATE SURGERY     biopsy   . TONSILLECTOMY AND ADENOIDECTOMY   1936  . TOTAL HIP ARTHROPLASTY  2007   left , Dr Cay Schillings   Family History  Problem Relation Age of Onset  . Arthritis Mother   . Kidney cancer Neg Hx   . Kidney disease Neg Hx   . Prostate cancer Neg Hx    Social History   Socioeconomic History  . Marital status: Unknown    Spouse name: None  . Number of children: None  . Years of education: None  . Highest education level: None  Social Needs  . Financial resource strain: None  . Food insecurity - worry: None  . Food insecurity - inability: None  . Transportation needs - medical: None  . Transportation needs - non-medical: None  Occupational History  . None  Tobacco Use  . Smoking status: Former Smoker    Last attempt to quit: 04/16/1954    Years since quitting: 63.0  . Smokeless tobacco: Never Used  Substance and Sexual Activity  . Alcohol use: No  . Drug use: No  . Sexual activity: None  Other Topics Concern  . None  Social History Narrative  . None    Outpatient Encounter Medications as of 04/19/2017  Medication Sig  . aspirin 81 MG tablet Take 81 mg by mouth daily.  Marland Kitchen doxazosin (CARDURA) 2 MG tablet TAKE 1  TABLET BY MOUTH AT  BEDTIME  . finasteride (PROSCAR) 5 MG tablet TAKE 1 TABLET BY MOUTH  DAILY  . Magnesium 250 MG TABS Take 1 tablet by mouth daily.  . Multiple Vitamin (MULTIVITAMIN) tablet Take 1 tablet by mouth daily.  . polyethylene glycol powder (MIRALAX) powder Take 17 g by mouth every 3 (three) days.  . ranitidine (ZANTAC) 75 MG tablet Take 75 mg by mouth 2 (two) times daily.  . [DISCONTINUED] metoprolol succinate (TOPROL-XL) 25 MG 24 hr tablet Take 1 tablet (25 mg total) by mouth daily.   No facility-administered encounter medications on file as of 04/19/2017.     Activities of Daily Living In your present state of health, do you have any difficulty performing the following activities: 04/19/2017  Hearing? Y  Vision? N  Difficulty concentrating or making decisions? Y  Comment Memory delay  Walking  or climbing stairs? N  Dressing or bathing? N  Doing errands, shopping? N  Preparing Food and eating ? N  Using the Toilet? N  In the past six months, have you accidently leaked urine? N  Do you have problems with loss of bowel control? N  Managing your Medications? N  Managing your Finances? N  Housekeeping or managing your Housekeeping? N  Some recent data might be hidden    Patient Care Team: Crecencio Mc, MD as PCP - General (Internal Medicine)   Assessment:   This is a routine wellness examination for Marv. The goal of the wellness visit is to assist the patient how to close the gaps in care and create a preventative care plan for the patient.   The roster of all physicians providing medical care to patient is listed in the Snapshot section of the chart.  Taking calcium VIT D as appropriate/Osteoporosis risk reviewed.    Safety issues reviewed; Smoke and carbon monoxide detectors in the home. No firearms in the home.  Wears seatbelts when driving or riding with others. Patient does wear sunscreen or protective clothing when in direct sunlight. No violence in the home.  Depression- PHQ 2 &9 complete.  No signs/symptoms or verbal communication regarding little pleasure in doing things, feeling down, depressed or hopeless. No changes in sleeping, energy, eating, concentrating.  No thoughts of self harm or harm towards others.  Time spent on this topic is 8 minutes.   Patient is alert, normal appearance, oriented to person/place/and time.  Correctly identified the president of the [place], recall of 3/3 words, and performing simple calculations. Displays appropriate judgement and can read correct time from watch face.   No new identified risk were noted.  No failures at ADL's or IADL's.    BMI- discussed the importance of a healthy diet, water intake and the benefits of aerobic exercise. Educational material provided.   24 hour diet recall: Breakfast: cornflakes Lunch: deli  sandwich Dinner: soup, sandwich  Snack: granola bars Daily fluid intake: 8-10 cups of caffeine, 3 cups of water  Dental- upper/lower implants  Eye- Visual acuity not assessed per patient preference since they have regular follow up with the ophthalmologist.  Wears corrective lenses.  Sleep patterns- Sleeps 8 hours at night.  Wakes feeling rested.   Health maintenance gaps- closed.  Patient Concerns: None at this time. Follow up with PCP as needed.  Exercise Activities and Dietary recommendations Current Exercise Habits: Home exercise routine, Type of exercise: walking, Time (Minutes): 20, Frequency (Times/Week): 2, Weekly Exercise (Minutes/Week): 40, Intensity: Mild  Goals    . DIET -  INCREASE WATER INTAKE    . Increase physical activity     Walk for exercise       Fall Risk Fall Risk  04/19/2017 06/26/2016 12/08/2013  Falls in the past year? No No No   Depression Screen PHQ 2/9 Scores 04/19/2017 06/26/2016 12/08/2013  PHQ - 2 Score 0 0 0    Cognitive Function        Immunization History  Administered Date(s) Administered  . Influenza Split 02/06/2013, 05/29/2014  . Influenza, High Dose Seasonal PF 12/26/2015, 12/21/2016  . Influenza-Unspecified 01/21/2014  . Pneumococcal Conjugate-13 12/07/2013  . Pneumococcal Polysaccharide-23 02/07/2011  . Td 01/21/2014  . Zoster 11/19/2011   Screening Tests Health Maintenance  Topic Date Due  . TETANUS/TDAP  01/22/2024  . INFLUENZA VACCINE  Completed  . PNA vac Low Risk Adult  Completed      Plan:    End of life planning; Advance aging; Advanced directives discussed. Copy of current HCPOA/Living Will on file.    I have personally reviewed and noted the following in the patient's chart:   . Medical and social history . Use of alcohol, tobacco or illicit drugs  . Current medications and supplements . Functional ability and status . Nutritional status . Physical activity . Advanced directives . List of other  physicians . Hospitalizations, surgeries, and ER visits in previous 12 months . Vitals . Screenings to include cognitive, depression, and falls . Referrals and appointments  In addition, I have reviewed and discussed with patient certain preventive protocols, quality metrics, and best practice recommendations. A written personalized care plan for preventive services as well as general preventive health recommendations were provided to patient.     OBrien-Blaney, Leanette Eutsler L, LPN  5/0/2774    I have reviewed the above information and agree with above.   Deborra Medina, MD

## 2017-07-08 ENCOUNTER — Other Ambulatory Visit: Payer: Self-pay | Admitting: Internal Medicine

## 2017-07-15 ENCOUNTER — Telehealth: Payer: Self-pay | Admitting: Internal Medicine

## 2017-07-15 NOTE — Telephone Encounter (Signed)
Copied from [place] [id number]. Topic: Quick Communication - Rx Refill/Question >> Jul 15, 2017  2:05 PM [name] wrote: Medication: finasteride (PROSCAR) 5 MG tablet Has the patient contacted their pharmacy? Yes.   (Agent: If no, request that the patient contact the pharmacy for the refill.) Preferred Pharmacy (with phone number or street name):[facility]  [address]  Agent: Please be advised that RX refills may take up to 3 business days. We ask that you follow-up with your pharmacy.  Pt needs to have 5 day supply sent into local pharm - until his mail order comes in the mail

## 2017-07-16 MED ORDER — FINASTERIDE 5 MG PO TABS: 5.0000 mg | ORAL_TABLET | Freq: Every day | ORAL | 0 refills | Status: DC

## 2017-07-16 NOTE — Telephone Encounter (Signed)
Med refilled by Dr. [name] on 07-16-17.

## 2017-07-16 NOTE — Telephone Encounter (Signed)
Refill request for Proscar 5 mg for 5 days to local pharmacy.  LOV  04/19/17  Provider:  Deborra Medina, MD  Refill sent for 5 days.

## 2017-09-02 ENCOUNTER — Other Ambulatory Visit: Payer: Self-pay | Admitting: Internal Medicine

## 2017-09-27 ENCOUNTER — Ambulatory Visit (INDEPENDENT_AMBULATORY_CARE_PROVIDER_SITE_OTHER): Payer: Medicare Other | Admitting: Internal Medicine

## 2017-09-27 ENCOUNTER — Encounter: Payer: Self-pay | Admitting: Internal Medicine

## 2017-09-27 VITALS — BP 144/64 | HR 103 | Temp 98.0°F | Ht 67.5 in | Wt 192.8 lb

## 2017-09-27 DIAGNOSIS — K5900 Constipation, unspecified: Secondary | ICD-10-CM

## 2017-09-27 DIAGNOSIS — B029 Zoster without complications: Secondary | ICD-10-CM | POA: Diagnosis not present

## 2017-09-27 MED ORDER — VALACYCLOVIR HCL 1 G PO TABS: 1000.0000 mg | ORAL_TABLET | Freq: Three times a day (TID) | ORAL | 0 refills | Status: DC

## 2017-09-27 NOTE — Patient Instructions (Addendum)
Please f/u in 2 weeks with PCP   Warm prune juice Increase fiber I.e Benefiber or Metamucil  Continue Miralax 1 x per day  Add stool softner/Colace docusate   OR  Milk Magnesia   Constipation, Adult Constipation is when a person has fewer bowel movements in a week than normal, has difficulty having a bowel movement, or has stools that are dry, hard, or larger than normal. Constipation may be caused by an underlying condition. It may become worse with age if a person takes certain medicines and does not take in enough fluids. Follow these instructions at home: Eating and drinking   Eat foods that have a lot of fiber, such as fresh fruits and vegetables, whole grains, and beans.  Limit foods that are high in fat, low in fiber, or overly processed, such as french fries, hamburgers, cookies, candies, and soda.  Drink enough fluid to keep your urine clear or pale yellow. General instructions  Exercise regularly or as told by your health care provider.  Go to the restroom when you have the urge to go. Do not hold it in.  Take over-the-counter and prescription medicines only as told by your health care provider. These include any fiber supplements.  Practice pelvic floor retraining exercises, such as deep breathing while relaxing the lower abdomen and pelvic floor relaxation during bowel movements.  Watch your condition for any changes.  Keep all follow-up visits as told by your health care provider. This is important. Contact a health care provider if:  You have pain that gets worse.  You have a fever.  You do not have a bowel movement after 4 days.  You vomit.  You are not hungry.  You lose weight.  You are bleeding from the anus.  You have thin, pencil-like stools. Get help right away if:  You have a fever and your symptoms suddenly get worse.  You leak stool or have blood in your stool.  Your abdomen is bloated.  You have severe pain in your abdomen.  You  feel dizzy or you faint. This information is not intended to replace advice given to you by your health care provider. Make sure you discuss any questions you have with your health care provider. Document Released: 12/30/2003 Document Revised: 10/21/2015 Document Reviewed: 09/21/2015 Elsevier Interactive Patient Education  2018 [place], which is also known as herpes zoster, is an infection that causes a painful skin rash and fluid-filled blisters. Shingles is not related to genital herpes, which is a sexually transmitted infection. Shingles only develops in people who:  Have had chickenpox.  Have received the chickenpox vaccine. (This is rare.)  What are the causes? Shingles is caused by varicella-zoster virus (VZV). This is the same virus that causes chickenpox. After exposure to VZV, the virus stays in the body in an inactive (dormant) state. Shingles develops if the virus reactivates. This can happen many years after the initial exposure to VZV. It is not known what causes this virus to reactivate. What increases the risk? People who have had chickenpox or received the chickenpox vaccine are at risk for shingles. Infection is more common in people who:  Are older than age 76.  Have a weakened defense (immune) system, such as those with HIV, AIDS, or cancer.  Are taking medicines that weaken the immune system, such as transplant medicines.  Are under great stress.  What are the signs or symptoms? Early symptoms of this condition include itching, tingling, and pain in  an area on your skin. Pain may be described as burning, stabbing, or throbbing. A few days or weeks after symptoms start, a painful red rash appears, usually on one side of the body in a bandlike or beltlike pattern. The rash eventually turns into fluid-filled blisters that break open, scab over, and dry up in about 2-3 weeks. At any time during the infection, you may also develop:  A  fever.  Chills.  A headache.  An upset stomach.  How is this diagnosed? This condition is diagnosed with a skin exam. Sometimes, skin or fluid samples are taken from the blisters before a diagnosis is made. These samples are examined under a microscope or sent to a lab for testing. How is this treated? There is no specific cure for this condition. Your health care provider will probably prescribe medicines to help you manage pain, recover more quickly, and avoid long-term problems. Medicines may include:  Antiviral drugs.  Anti-inflammatory drugs.  Pain medicines.  If the area involved is on your face, you may be referred to a specialist, such as an eye doctor (ophthalmologist) or an ear, nose, and throat (ENT) doctor to help you avoid eye problems, chronic pain, or disability. Follow these instructions at home: Medicines  Take medicines only as directed by your health care provider.  Apply an anti-itch or numbing cream to the affected area as directed by your health care provider. Blister and Rash Care  Take a cool bath or apply cool compresses to the area of the rash or blisters as directed by your health care provider. This may help with pain and itching.  Keep your rash covered with a loose bandage (dressing). Wear loose-fitting clothing to help ease the pain of material rubbing against the rash.  Keep your rash and blisters clean with mild soap and cool water or as directed by your health care provider.  Check your rash every day for signs of infection. These include redness, swelling, and pain that lasts or increases.  Do not pick your blisters.  Do not scratch your rash. General instructions  Rest as directed by your health care provider.  Keep all follow-up visits as directed by your health care provider. This is important.  Until your blisters scab over, your infection can cause chickenpox in people who have never had it or been vaccinated against it. To prevent  this from happening, avoid contact with other people, especially: ? Babies. ? Pregnant women. ? Children who have eczema. ? Elderly people who have transplants. ? People who have chronic illnesses, such as leukemia or AIDS. Contact a health care provider if:  Your pain is not relieved with prescribed medicines.  Your pain does not get better after the rash heals.  Your rash looks infected. Signs of infection include redness, swelling, and pain that lasts or increases. Get help right away if:  The rash is on your face or nose.  You have facial pain, pain around your eye area, or loss of feeling on one side of your face.  You have ear pain or you have ringing in your ear.  You have loss of taste.  Your condition gets worse. This information is not intended to replace advice given to you by your health care provider. Make sure you discuss any questions you have with your health care provider. Document Released: 04/02/2005 Document Revised: 11/27/2015 Document Reviewed: 02/11/2014 Elsevier Interactive Patient Education  2018 [employer].

## 2017-09-27 NOTE — Progress Notes (Signed)
Pre visit review using our clinic review tool, if applicable. No additional management support is needed unless otherwise documented below in the visit note. 

## 2017-09-27 NOTE — Progress Notes (Signed)
Chief Complaint  Patient presents with  . Rash  . Constipation   Acute visit  1. C/o rash to right buttocks, right post thigh and right groin not particularly painful now worse with sleeping on that side w/in last week. He has been using camphophenic or camphorated oil to help with pain per pt orally. Rash is somewhat itchy 2. Constipation x 2-3 days tried suppository, enema x 1, miralax and stool is still hard and feels like did not all come out   Review of Systems  Constitutional: Negative for fever.  Respiratory: Negative for shortness of breath.   Cardiovascular: Negative for chest pain.  Gastrointestinal: Positive for constipation. Negative for blood in stool.  Skin: Positive for itching and rash.   Past Medical History:  Diagnosis Date  . Anxiety   . Arthritis   . BPH (benign prostatic hypertrophy)   . DVT (deep vein thrombosis) in pregnancy ([place])   . Presbyacusis    right sided hearing aid   Past Surgical History:  Procedure Laterality Date  . EYE SURGERY     cataract surgery  . HERNIA REPAIR    . JOINT REPLACEMENT  1953   ankle crush injury, right  . PROSTATE SURGERY     biopsy   . TONSILLECTOMY AND ADENOIDECTOMY  1936  . TOTAL HIP ARTHROPLASTY  2007   left , Dr Cay Schillings   Family History  Problem Relation Age of Onset  . Arthritis Mother   . Kidney cancer Neg Hx   . Kidney disease Neg Hx   . Prostate cancer Neg Hx    Social History   Socioeconomic History  . Marital status: Unknown    Spouse name: Not on file  . Number of children: Not on file  . Years of education: Not on file  . Highest education level: Not on file  Occupational History  . Not on file  Social Needs  . Financial resource strain: Not on file  . Food insecurity:    Worry: Not on file    Inability: Not on file  . Transportation needs:    Medical: Not on file    Non-medical: Not on file  Tobacco Use  . Smoking status: Former Smoker    Last attempt to quit: 04/16/1954    Years since  quitting: 63.4  . Smokeless tobacco: Never Used  Substance and Sexual Activity  . Alcohol use: No  . Drug use: No  . Sexual activity: Not on file  Lifestyle  . Physical activity:    Days per week: Not on file    Minutes per session: Not on file  . Stress: Not on file  Relationships  . Social connections:    Talks on phone: Not on file    Gets together: Not on file    Attends religious service: Not on file    Active member of club or organization: Not on file    Attends meetings of clubs or organizations: Not on file    Relationship status: Not on file  . Intimate partner violence:    Fear of current or ex partner: Not on file    Emotionally abused: Not on file    Physically abused: Not on file    Forced sexual activity: Not on file  Other Topics Concern  . Not on file  Social History Narrative  . Not on file   Current Meds  Medication Sig  . aspirin 81 MG tablet Take 81 mg by mouth daily.  Marland Kitchen  doxazosin (CARDURA) 2 MG tablet TAKE 1 TABLET BY MOUTH AT  BEDTIME  . finasteride (PROSCAR) 5 MG tablet TAKE 1 TABLET BY MOUTH  DAILY  . Multiple Vitamin (MULTIVITAMIN) tablet Take 1 tablet by mouth daily.  . polyethylene glycol powder (MIRALAX) powder Take 17 g by mouth every 3 (three) days.  . ranitidine (ZANTAC) 75 MG tablet Take 75 mg by mouth 2 (two) times daily.  . [DISCONTINUED] Magnesium 250 MG TABS Take 1 tablet by mouth daily.   No Known Allergies No results found for this or any previous visit (from the past 2160 hour(s)). Objective  Body mass index is 29.75 kg/m. Wt Readings from Last 3 Encounters:  09/27/17 192 lb 12.8 oz (87.5 kg)  04/19/17 195 lb 1.9 oz (88.5 kg)  02/13/17 195 lb 9.6 oz (88.7 kg)   Temp Readings from Last 3 Encounters:  09/27/17 98 F (36.7 C) (Oral)  04/19/17 98.2 F (36.8 C) (Oral)  02/13/17 97.9 F (36.6 C) (Oral)   BP Readings from Last 3 Encounters:  09/27/17 (!) 144/64  04/19/17 138/62  02/13/17 134/76   Pulse Readings from Last 3  Encounters:  09/27/17 (!) 103  04/19/17 77  02/13/17 84    Physical Exam  Constitutional: He is oriented to person, place, and time. Vital signs are normal. He appears well-developed and well-nourished. He is cooperative.  HENT:  Head: Normocephalic and atraumatic.  Mouth/Throat: Oropharynx is clear and moist and mucous membranes are normal.  Eyes: Pupils are equal, round, and reactive to light. Conjunctivae are normal.  Cardiovascular: Normal rate, regular rhythm and normal heart sounds.  Pulmonary/Chest: Effort normal and breath sounds normal.  Genitourinary: Rectal exam shows anal tone normal.  Genitourinary Comments: Not impacted on rectal exam today   Neurological: He is alert and oriented to person, place, and time. Gait normal.  Skin: Skin is warm, dry and intact. Rash noted. Rash is pustular and vesicular.     Psychiatric: He has a normal mood and affect. His speech is normal and behavior is normal. Judgment and thought content normal. Cognition and memory are normal.  Nursing note and vitals reviewed.   Assessment   1. Shingles  2. Constipation  Plan   1. Valtrex 1 mg tid x 1 week  F/u in 2 weeks  2. rec otc meds pruce juice, cont miralax increase fiber, add stool softner  trial of milk of magnesium if needed   Provider: Dr. Olivia Mackie McLean-Scocuzza-Internal Medicine

## 2017-10-07 ENCOUNTER — Other Ambulatory Visit: Payer: Self-pay | Admitting: Internal Medicine

## 2017-10-16 ENCOUNTER — Telehealth: Payer: Self-pay

## 2017-10-16 ENCOUNTER — Ambulatory Visit: Payer: Medicare Other

## 2017-10-16 ENCOUNTER — Ambulatory Visit
Admission: RE | Admit: 2017-10-16 | Discharge: 2017-10-16 | Disposition: A | Discharge status: Home / Self Care | Payer: Medicare Other | Source: Ambulatory Visit | Attending: Internal Medicine | Admitting: Internal Medicine

## 2017-10-16 ENCOUNTER — Encounter: Payer: Self-pay | Admitting: Internal Medicine

## 2017-10-16 ENCOUNTER — Ambulatory Visit (INDEPENDENT_AMBULATORY_CARE_PROVIDER_SITE_OTHER): Payer: Medicare Other | Admitting: Internal Medicine

## 2017-10-16 ENCOUNTER — Ambulatory Visit (INDEPENDENT_AMBULATORY_CARE_PROVIDER_SITE_OTHER): Payer: Medicare Other

## 2017-10-16 VITALS — BP 122/58 | HR 84 | Temp 97.9°F | Resp 16 | Ht 67.5 in | Wt 187.8 lb

## 2017-10-16 DIAGNOSIS — R29898 Other symptoms and signs involving the musculoskeletal system: Secondary | ICD-10-CM

## 2017-10-16 DIAGNOSIS — M2141 Flat foot [pes planus] (acquired), right foot: Secondary | ICD-10-CM | POA: Insufficient documentation

## 2017-10-16 DIAGNOSIS — M7989 Other specified soft tissue disorders: Secondary | ICD-10-CM

## 2017-10-16 DIAGNOSIS — M7121 Synovial cyst of popliteal space [Baker], right knee: Secondary | ICD-10-CM | POA: Insufficient documentation

## 2017-10-16 DIAGNOSIS — B029 Zoster without complications: Secondary | ICD-10-CM | POA: Diagnosis not present

## 2017-10-16 DIAGNOSIS — R7303 Prediabetes: Secondary | ICD-10-CM

## 2017-10-16 DIAGNOSIS — M19071 Primary osteoarthritis, right ankle and foot: Secondary | ICD-10-CM | POA: Diagnosis not present

## 2017-10-16 DIAGNOSIS — R5383 Other fatigue: Secondary | ICD-10-CM

## 2017-10-16 DIAGNOSIS — I10 Essential (primary) hypertension: Secondary | ICD-10-CM

## 2017-10-16 LAB — COMPREHENSIVE METABOLIC PANEL
ALBUMIN: 4 g/dL (ref 3.5–5.2)
ALT: 14 U/L (ref 0–53)
AST: 16 U/L (ref 0–37)
Alkaline Phosphatase: 70 U/L (ref 39–117)
BUN: 17 mg/dL (ref 6–23)
CALCIUM: 9.2 mg/dL (ref 8.4–10.5)
CHLORIDE: 101 meq/L (ref 96–112)
CO2: 29 meq/L (ref 19–32)
Creatinine, Ser: 1.07 mg/dL (ref 0.40–1.50)
GFR: 69.26 mL/min (ref >60.00)
Glucose, Bld: 110 mg/dL — ABNORMAL HIGH (ref 70–99)
Potassium: 4.4 mEq/L (ref 3.5–5.1)
SODIUM: 136 meq/L (ref 135–145)
Total Bilirubin: 0.9 mg/dL (ref 0.2–1.2)
Total Protein: 7.4 g/dL (ref 6.0–8.3)

## 2017-10-16 LAB — CBC WITH DIFFERENTIAL/PLATELET
BASOS PCT: 0.6 % (ref 0.0–3.0)
Basophils Absolute: 0.1 10*3/uL (ref 0.0–0.1)
EOS PCT: 2.3 % (ref 0.0–5.0)
Eosinophils Absolute: 0.2 10*3/uL (ref 0.0–0.7)
HCT: 40.4 % (ref 39.0–52.0)
HEMOGLOBIN: 13.5 g/dL (ref 13.0–17.0)
LYMPHS ABS: 2.5 10*3/uL (ref 0.7–4.0)
Lymphocytes Relative: 28.1 % (ref 12.0–46.0)
MCHC: 33.5 g/dL (ref 30.0–36.0)
MCV: 87 fl (ref 78.0–100.0)
MONO ABS: 0.7 10*3/uL (ref 0.1–1.0)
MONOS PCT: 7.3 % (ref 3.0–12.0)
NEUTROS PCT: 61.7 % (ref 43.0–77.0)
Neutro Abs: 5.5 10*3/uL (ref 1.4–7.7)
Platelets: 288 10*3/uL (ref 150.0–400.0)
RBC: 4.64 Mil/uL (ref 4.22–5.81)
RDW: 14.3 % (ref 11.5–15.5)
WBC: 9 10*3/uL (ref 4.0–10.5)

## 2017-10-16 LAB — TSH: TSH: 4.21 u[IU]/mL (ref 0.35–4.50)

## 2017-10-16 LAB — CK: Total CK: 79 U/L (ref 7–232)

## 2017-10-16 LAB — VITAMIN B12

## 2017-10-16 LAB — HEMOGLOBIN A1C: Hgb A1c MFr Bld: 5.6 % (ref 4.6–6.5)

## 2017-10-16 NOTE — Telephone Encounter (Signed)
Thank you :)

## 2017-10-16 NOTE — Patient Instructions (Addendum)
Check your BP once a day at different tiems for one week  Send me the readings    Do not start the metoprolol unless I tell you to   [facility] to use miralax on a daily basis NOT WITH YOUR OTHER PILLS    Your right calf is weak..  I am not sure if this is a complication of the shingles or due to a different kind of "Neuropathy" so I am referring you to a neurologist for further evaluatio

## 2017-10-16 NOTE — Progress Notes (Signed)
Subjective:  Patient ID: Corey Todd, male    DOB: [birth year]  Age: 84 y.o. MRN: [id number]  CC: The primary encounter diagnosis was Weakness of foot, right. Diagnoses of Essential hypertension, Fatigue, unspecified type, Prediabetes, Right leg swelling, Right leg weakness, and Herpes zoster without complication were also pertinent to this visit.  HPI Corey Todd presents for 2 week follow up on shingles and constipation . Treated by Dr Aundra Dubin with Valtrex, and advised to use mirralax and prune juice  His shingles rash has resolved / He denies pain.  His stool pattern has  improved and is now back to baseline , which is to say that he moves his vowesl every 3 or 4 days  (chronic)   Cc: his wife noted a change in his gait over the past several weeks.  Patient is vague about the onset but states that his right foot/leg  Has become too weak to ambulate without a walker.  He has chronic LE edema but notes an increase in the swelling of the right ankle .  Both symptoms started around the tme he developed shingles,  He thinks.  Has been stumbling a lot , but has not fallen and denies any pain in the foot ar ankle. He has a remote history of a  crush injury to the right ankle in 1962,  And a remote left  hip replacement in 2005 .    MSK exam normal  Strength is 5/.5 until standing up, then  notable for calf weakness on right. (cannot raise up onto toes of right foot)  Outpatient Medications Prior to Visit  Medication Sig Dispense Refill  . aspirin 81 MG tablet Take 81 mg by mouth daily.    Marland Kitchen doxazosin (CARDURA) 2 MG tablet TAKE 1 TABLET BY MOUTH AT  BEDTIME 90 tablet 1  . finasteride (PROSCAR) 5 MG tablet TAKE 1 TABLET BY MOUTH  DAILY 90 tablet 1  . Multiple Vitamin (MULTIVITAMIN) tablet Take 1 tablet by mouth daily.    . polyethylene glycol powder (MIRALAX) powder Take 17 g by mouth every 3 (three) days.    . ranitidine (ZANTAC) 75 MG tablet Take 75 mg by mouth 2 (two) times daily.    .  metoprolol succinate (TOPROL-XL) 25 MG 24 hr tablet TAKE 1 TABLET BY MOUTH  DAILY (Patient not taking: Reported on 10/16/2017) 90 tablet 1  . valACYclovir (VALTREX) 1000 MG tablet Take 1 tablet (1,000 mg total) by mouth 3 (three) times daily. (Patient not taking: Reported on 10/16/2017) 21 tablet 0   No facility-administered medications prior to visit.     Review of Systems;  Patient denies headache, fevers, malaise, unintentional weight loss, skin rash, eye pain, sinus congestion and sinus pain, sore throat, dysphagia,  hemoptysis , cough, dyspnea, wheezing, chest pain, palpitations, orthopnea, edema, abdominal pain, nausea, melena, diarrhea, constipation, flank pain, dysuria, hematuria, urinary  Frequency, nocturia, numbness, tingling, seizures,  Focal weakness, Loss of consciousness,  Tremor, insomnia, depression, anxiety, and suicidal ideation.      Objective:  BP (!) 122/58 (BP Location: Left Arm, Patient Position: Sitting, Cuff Size: Normal)   Pulse 84   Temp 97.9 F (36.6 C) (Oral)   Resp 16   Ht 5' 7.5" (1.715 m)   Wt 187 lb 12.8 oz (85.2 kg)   SpO2 95%   BMI 28.98 kg/m   BP Readings from Last 3 Encounters:  10/16/17 (!) 122/58  09/27/17 (!) 144/64  04/19/17 138/62    Wt Readings  from Last 3 Encounters:  10/16/17 187 lb 12.8 oz (85.2 kg)  09/27/17 192 lb 12.8 oz (87.5 kg)  04/19/17 195 lb 1.9 oz (88.5 kg)    General appearance: alert, cooperative and appears stated age Ears: normal TM's and external ear canals both ears Throat: lips, mucosa, and tongue normal; teeth and gums normal Neck: no adenopathy, no carotid bruit, supple, symmetrical, trachea midline and thyroid not enlarged, symmetric, no tenderness/mass/nodules Back: symmetric, no curvature. ROM normal. No CVA tenderness. Lungs: clear to auscultation bilaterally Heart: regular rate and rhythm, S1, S2 normal, no murmur, click, rub or gallop Abdomen: soft, non-tender; bowel sounds normal; no masses,  no  organomegaly Pulses: 2+ and symmetric Skin: Skin color, texture, turgor normal. No rashes or lesions MSK: 5/5 strength in distal extremity right leg,  But cannot rise up on toes unless aided by left leg  Gait: abnormal and apprehesive,  Not antalgic or wife based.; lifts the right foot entirely and carefully places It on floor Lymph nodes: Cervical, supraclavicular, and axillary nodes normal.  Lab Results  Component Value Date   HGBA1C 5.6 10/16/2017   HGBA1C 5.4 12/26/2015    Lab Results  Component Value Date   CREATININE 1.07 10/16/2017   CREATININE 1.10 02/13/2017   CREATININE 1.09 06/26/2016    Lab Results  Component Value Date   WBC 9.0 10/16/2017   HGB 13.5 10/16/2017   HCT 40.4 10/16/2017   PLT 288.0 10/16/2017   GLUCOSE 110 (H) 10/16/2017   CHOL 146 11/21/2012   TRIG 108.0 11/21/2012   HDL 48.80 11/21/2012   LDLCALC 76 11/21/2012   ALT 14 10/16/2017   AST 16 10/16/2017   NA 136 10/16/2017   K 4.4 10/16/2017   CL 101 10/16/2017   CREATININE 1.07 10/16/2017   BUN 17 10/16/2017   CO2 29 10/16/2017   TSH 4.21 10/16/2017   HGBA1C 5.6 10/16/2017   MICROALBUR <0.7 06/26/2016    No results found.  Assessment & Plan:   Problem List Items Addressed This Visit    Hypertension    Currently well controlled without metoprolol ,which he has not taken in several months apparently. Asked  Patient to check pressure twice weekly at home and submit readings       Relevant Orders   Comprehensive metabolic panel (Completed)   Herpes zoster without complication    His symptoms havenearly completely resolved at two week follow up       Right leg swelling    Stat US was negative for DVT.  Likely VI due to inactivity.  Continue use of compression knee highs      Right leg weakness    Etiology of right calf weakness unclear.  He denies low back pain, and sensation is intact. He has a histroy of a remote crush injury to ankle, and a meta screw is noted in the distal tiia  on plain films today,  But no acute changes are seen in the ankle or foot films .  CK is normal, so myositis from viral illness is unlikely.  I suspect he may have a sensorimotor neuropathy.  Neurology referral for evaluation with EMG studies if screening labs today are unrevealing.  Lab Results  Component Value Date   TSH 4.21 10/16/2017   Lab Results  Component Value Date   CKTOTAL 79 10/16/2017   Lab Results  Component Value Date   HGBA1C 5.6 10/16/2017   Lab Results  Component Value Date   NA 136 10/16/2017  K 4.4 10/16/2017   CL 101 10/16/2017   CO2 29 10/16/2017   No results found for: RPR No results found for: HIV1X2         Other Visit Diagnoses    Weakness of foot, right    -  Primary   Relevant Orders   DG Foot Complete Right (Completed)   TSH (Completed)   Vitamin B12 (Completed)   RPR   CK (Creatine Kinase) (Completed)   HIV antibody (with reflex)   Ambulatory referral to Neurology   DG Ankle Complete Right (Completed)   US Venous Img Lower Unilateral Right (Completed)   Fatigue, unspecified type       Relevant Orders   CBC with Differential/Platelet (Completed)   Prediabetes       Relevant Orders   Hemoglobin A1c (Completed)    A total of 40 minutes was spent with patient more than half of which was spent in counseling patient on the above mentioned issues , reviewing and explaining recent labs and imaging studies done, and coordination of care.   I have discontinued Antion Andres. Berk's valACYclovir and metoprolol succinate. I am also having him maintain his multivitamin, aspirin, polyethylene glycol powder, ranitidine, doxazosin, and finasteride.  No orders of the defined types were placed in this encounter.   Medications Discontinued During This Encounter  Medication Reason  . valACYclovir (VALTREX) 1000 MG tablet Completed Course  . metoprolol succinate (TOPROL-XL) 25 MG 24 hr tablet Patient has not taken in last 30 days    Follow-up: No  follow-ups on file.   Crecencio Mc, MD

## 2017-10-16 NOTE — Telephone Encounter (Signed)
Lilia Pro from Ultrasound called with called with call  report  See results in Epic   US Venous IMG Lower Unilateral Right  Impression No evidence of right lower extremity DVT.  Small Baker's cyst in the right popliteal fossa.  Ok'd to let patient go, you would call with further instructions.

## 2017-10-17 DIAGNOSIS — M7989 Other specified soft tissue disorders: Secondary | ICD-10-CM | POA: Insufficient documentation

## 2017-10-17 DIAGNOSIS — R29898 Other symptoms and signs involving the musculoskeletal system: Secondary | ICD-10-CM | POA: Insufficient documentation

## 2017-10-17 NOTE — Assessment & Plan Note (Addendum)
His symptoms havenearly completely resolved at two week follow up

## 2017-10-17 NOTE — Assessment & Plan Note (Signed)
Stat US was negative for DVT.  Likely VI due to inactivity.  Continue use of compression knee highs

## 2017-10-17 NOTE — Assessment & Plan Note (Addendum)
Etiology of right calf weakness unclear.  He denies low back pain, and sensation is intact. He has a histroy of a remote crush injury to ankle, and a meta screw is noted in the distal tiia on plain films today,  But no acute changes are seen in the ankle or foot films .  CK is normal, so myositis from viral illness is unlikely.  I suspect he may have a sensorimotor neuropathy.  Neurology referral for evaluation with EMG studies if screening labs today are unrevealing.  Lab Results  Component Value Date   TSH 4.21 10/16/2017   Lab Results  Component Value Date   CKTOTAL 79 10/16/2017   Lab Results  Component Value Date   HGBA1C 5.6 10/16/2017   Lab Results  Component Value Date   NA 136 10/16/2017   K 4.4 10/16/2017   CL 101 10/16/2017   CO2 29 10/16/2017   No results found for: RPR No results found for: HIV1X2

## 2017-10-17 NOTE — Assessment & Plan Note (Signed)
Currently well controlled without metoprolol ,which he has not taken in several months apparently. Asked  Patient to check pressure twice weekly at home and submit readings

## 2017-10-18 LAB — HIV ANTIBODY (ROUTINE TESTING W REFLEX): HIV 1&2 Ab, 4th Generation: NONREACTIVE

## 2017-10-18 LAB — RPR: RPR: NONREACTIVE

## 2017-11-06 ENCOUNTER — Telehealth: Payer: Self-pay | Admitting: Internal Medicine

## 2017-11-06 DIAGNOSIS — I10 Essential (primary) hypertension: Secondary | ICD-10-CM

## 2017-11-06 NOTE — Telephone Encounter (Signed)
Home bp readings are acceptable. But his message is unclear.    If he is taking metoprolol 25 mg ,  Continue taking it. And update his chart. j  if he is not,   Do not resume.

## 2017-11-06 NOTE — Telephone Encounter (Signed)
Spoke with both the pt and the pt's wife and informed them that the pt's bp readings are good. Asked if the pt is taking the metoprolol 25mg  and the wife stated that he is not taking so she was advised that the pt it to continue not taking it. Wife gave a verbal understanding. Spoke with wife per pt request because he is hard of hearing and wanted to make sure he didn't miss any of the information.

## 2017-11-06 NOTE — Assessment & Plan Note (Signed)
Home readings in July 2019  Ranged rom 559 systolic to 741 systolic. No changes to meds were made

## 2017-11-14 ENCOUNTER — Inpatient Hospital Stay: Payer: Medicare Other | Admitting: Anesthesiology

## 2017-11-14 ENCOUNTER — Encounter
Admission: EM | Disposition: A | Discharge status: Skilled Nursing Facility | Payer: Self-pay | Source: Home / Self Care | Attending: Internal Medicine

## 2017-11-14 ENCOUNTER — Emergency Department: Payer: Medicare Other

## 2017-11-14 ENCOUNTER — Other Ambulatory Visit: Payer: Self-pay

## 2017-11-14 ENCOUNTER — Inpatient Hospital Stay: Payer: Medicare Other

## 2017-11-14 ENCOUNTER — Inpatient Hospital Stay
Admission: EM | Admit: 2017-11-14 | Discharge: 2017-11-16 | DRG: 481 | Disposition: A | Discharge status: Skilled Nursing Facility | Payer: Medicare Other | Attending: Internal Medicine | Admitting: Internal Medicine

## 2017-11-14 DIAGNOSIS — N400 Enlarged prostate without lower urinary tract symptoms: Secondary | ICD-10-CM | POA: Diagnosis present

## 2017-11-14 DIAGNOSIS — Z86718 Personal history of other venous thrombosis and embolism: Secondary | ICD-10-CM | POA: Diagnosis not present

## 2017-11-14 DIAGNOSIS — Z96642 Presence of left artificial hip joint: Secondary | ICD-10-CM | POA: Diagnosis present

## 2017-11-14 DIAGNOSIS — I10 Essential (primary) hypertension: Secondary | ICD-10-CM | POA: Diagnosis present

## 2017-11-14 DIAGNOSIS — M1990 Unspecified osteoarthritis, unspecified site: Secondary | ICD-10-CM | POA: Diagnosis present

## 2017-11-14 DIAGNOSIS — S72141A Displaced intertrochanteric fracture of right femur, initial encounter for closed fracture: Principal | ICD-10-CM | POA: Diagnosis present

## 2017-11-14 DIAGNOSIS — Z9181 History of falling: Secondary | ICD-10-CM | POA: Diagnosis not present

## 2017-11-14 DIAGNOSIS — M80051A Age-related osteoporosis with current pathological fracture, right femur, initial encounter for fracture: Secondary | ICD-10-CM | POA: Diagnosis present

## 2017-11-14 DIAGNOSIS — Y92009 Unspecified place in unspecified non-institutional (private) residence as the place of occurrence of the external cause: Secondary | ICD-10-CM | POA: Diagnosis not present

## 2017-11-14 DIAGNOSIS — Z96661 Presence of right artificial ankle joint: Secondary | ICD-10-CM | POA: Diagnosis present

## 2017-11-14 DIAGNOSIS — Z7982 Long term (current) use of aspirin: Secondary | ICD-10-CM | POA: Diagnosis not present

## 2017-11-14 DIAGNOSIS — Z79899 Other long term (current) drug therapy: Secondary | ICD-10-CM

## 2017-11-14 DIAGNOSIS — M84459A Pathological fracture, hip, unspecified, initial encounter for fracture: Secondary | ICD-10-CM | POA: Diagnosis not present

## 2017-11-14 DIAGNOSIS — Z87891 Personal history of nicotine dependence: Secondary | ICD-10-CM

## 2017-11-14 DIAGNOSIS — M6281 Muscle weakness (generalized): Secondary | ICD-10-CM | POA: Diagnosis not present

## 2017-11-14 DIAGNOSIS — T148XXA Other injury of unspecified body region, initial encounter: Secondary | ICD-10-CM

## 2017-11-14 DIAGNOSIS — S72001A Fracture of unspecified part of neck of right femur, initial encounter for closed fracture: Secondary | ICD-10-CM

## 2017-11-14 DIAGNOSIS — M25551 Pain in right hip: Secondary | ICD-10-CM | POA: Diagnosis not present

## 2017-11-14 DIAGNOSIS — S299XXA Unspecified injury of thorax, initial encounter: Secondary | ICD-10-CM | POA: Diagnosis not present

## 2017-11-14 DIAGNOSIS — K219 Gastro-esophageal reflux disease without esophagitis: Secondary | ICD-10-CM | POA: Diagnosis present

## 2017-11-14 DIAGNOSIS — M810 Age-related osteoporosis without current pathological fracture: Secondary | ICD-10-CM | POA: Diagnosis not present

## 2017-11-14 DIAGNOSIS — S72009A Fracture of unspecified part of neck of unspecified femur, initial encounter for closed fracture: Secondary | ICD-10-CM | POA: Diagnosis not present

## 2017-11-14 DIAGNOSIS — H9111 Presbycusis, right ear: Secondary | ICD-10-CM | POA: Diagnosis present

## 2017-11-14 DIAGNOSIS — Z7401 Bed confinement status: Secondary | ICD-10-CM | POA: Diagnosis not present

## 2017-11-14 DIAGNOSIS — S72001S Fracture of unspecified part of neck of right femur, sequela: Secondary | ICD-10-CM | POA: Diagnosis present

## 2017-11-14 DIAGNOSIS — M80051D Age-related osteoporosis with current pathological fracture, right femur, subsequent encounter for fracture with routine healing: Secondary | ICD-10-CM | POA: Diagnosis not present

## 2017-11-14 DIAGNOSIS — W19XXXA Unspecified fall, initial encounter: Secondary | ICD-10-CM | POA: Diagnosis not present

## 2017-11-14 DIAGNOSIS — W010XXA Fall on same level from slipping, tripping and stumbling without subsequent striking against object, initial encounter: Secondary | ICD-10-CM | POA: Diagnosis present

## 2017-11-14 HISTORY — PX: INTRAMEDULLARY (IM) NAIL INTERTROCHANTERIC: SHX5875

## 2017-11-14 LAB — CBC WITH DIFFERENTIAL/PLATELET
BASOS ABS: 0 10*3/uL (ref 0–0.1)
BASOS PCT: 0 %
EOS ABS: 0.2 10*3/uL (ref 0–0.7)
EOS PCT: 1 %
HCT: 37.7 % — ABNORMAL LOW (ref 40.0–52.0)
HEMOGLOBIN: 12.7 g/dL — AB (ref 13.0–18.0)
Lymphocytes Relative: 16 %
Lymphs Abs: 2.1 10*3/uL (ref 1.0–3.6)
MCH: 29.6 pg (ref 26.0–34.0)
MCHC: 33.8 g/dL (ref 32.0–36.0)
MCV: 87.7 fL (ref 80.0–100.0)
Monocytes Absolute: 1.2 10*3/uL — ABNORMAL HIGH (ref 0.2–1.0)
Monocytes Relative: 10 %
NEUTROS PCT: 73 %
Neutro Abs: 9.4 10*3/uL — ABNORMAL HIGH (ref 1.4–6.5)
PLATELETS: 223 10*3/uL (ref 150–440)
RBC: 4.3 MIL/uL — AB (ref 4.40–5.90)
RDW: 14.5 % (ref 11.5–14.5)
WBC: 12.9 10*3/uL — AB (ref 3.8–10.6)

## 2017-11-14 LAB — BASIC METABOLIC PANEL
Anion gap: 6 (ref 5–15)
BUN: 23 mg/dL (ref 8–23)
CHLORIDE: 107 mmol/L (ref 98–111)
CO2: 24 mmol/L (ref 22–32)
CREATININE: 1.2 mg/dL (ref 0.61–1.24)
Calcium: 8.4 mg/dL — ABNORMAL LOW (ref 8.9–10.3)
GFR, EST NON AFRICAN AMERICAN: 52 mL/min — AB (ref >60)
Glucose, Bld: 106 mg/dL — ABNORMAL HIGH (ref 70–99)
Potassium: 3.6 mmol/L (ref 3.5–5.1)
SODIUM: 137 mmol/L (ref 135–145)

## 2017-11-14 LAB — PROTIME-INR
INR: 1.02
PROTHROMBIN TIME: 13.3 s (ref 11.4–15.2)

## 2017-11-14 SURGERY — FIXATION, FRACTURE, INTERTROCHANTERIC, WITH INTRAMEDULLARY ROD
Anesthesia: Spinal | Site: Hip | Laterality: Right | Wound class: Clean

## 2017-11-14 MED ORDER — METHOCARBAMOL 1000 MG/10ML IJ SOLN
500.0000 mg | Freq: Four times a day (QID) | INTRAVENOUS | Status: DC | PRN
  Filled 2017-11-14: qty 5

## 2017-11-14 MED ORDER — ALENDRONATE SODIUM 10 MG PO TABS: 70.0000 mg | ORAL_TABLET | ORAL | Status: DC

## 2017-11-14 MED ORDER — ACETAMINOPHEN 650 MG RE SUPP: 650.0000 mg | Freq: Four times a day (QID) | RECTAL | Status: DC | PRN

## 2017-11-14 MED ORDER — BUPIVACAINE LIPOSOME 1.3 % IJ SUSP
INTRAMUSCULAR | Status: DC | PRN
  Administered 2017-11-14: 50 mL

## 2017-11-14 MED ORDER — METOCLOPRAMIDE HCL 5 MG/ML IJ SOLN: 5.0000 mg | Freq: Three times a day (TID) | INTRAMUSCULAR | Status: DC | PRN

## 2017-11-14 MED ORDER — SODIUM CHLORIDE 0.9 % IR SOLN
Status: DC | PRN
  Administered 2017-11-14: 1000 mL

## 2017-11-14 MED ORDER — DEXTROSE-NACL 5-0.45 % IV SOLN
INTRAVENOUS | Status: DC
  Administered 2017-11-14: 15:00:00 via INTRAVENOUS

## 2017-11-14 MED ORDER — TRAMADOL HCL 50 MG PO TABS
50.0000 mg | ORAL_TABLET | Freq: Four times a day (QID) | ORAL | Status: DC | PRN
  Administered 2017-11-15: 50 mg via ORAL
  Filled 2017-11-14: qty 1

## 2017-11-14 MED ORDER — ONDANSETRON HCL 4 MG PO TABS: 4.0000 mg | ORAL_TABLET | Freq: Four times a day (QID) | ORAL | Status: DC | PRN

## 2017-11-14 MED ORDER — BUPIVACAINE HCL (PF) 0.5 % IJ SOLN
INTRAMUSCULAR | Status: DC | PRN
  Administered 2017-11-14: 3 mL via INTRATHECAL

## 2017-11-14 MED ORDER — PROPOFOL 10 MG/ML IV BOLUS
INTRAVENOUS | Status: DC | PRN
  Administered 2017-11-14: 30 mg via INTRAVENOUS

## 2017-11-14 MED ORDER — CEFAZOLIN SODIUM-DEXTROSE 2-4 GM/100ML-% IV SOLN
2.0000 g | Freq: Four times a day (QID) | INTRAVENOUS | Status: AC
  Administered 2017-11-15 (×3): 2 g via INTRAVENOUS
  Filled 2017-11-14 (×4): qty 100

## 2017-11-14 MED ORDER — PHENYLEPHRINE HCL 10 MG/ML IJ SOLN
INTRAMUSCULAR | Status: DC | PRN
  Administered 2017-11-14: 100 ug via INTRAVENOUS

## 2017-11-14 MED ORDER — KETAMINE HCL 50 MG/ML IJ SOLN
INTRAMUSCULAR | Status: AC
  Filled 2017-11-14: qty 10

## 2017-11-14 MED ORDER — DOXAZOSIN MESYLATE 2 MG PO TABS
2.0000 mg | ORAL_TABLET | Freq: Every day | ORAL | Status: DC
  Administered 2017-11-14 – 2017-11-15 (×2): 2 mg via ORAL
  Filled 2017-11-14 (×3): qty 1

## 2017-11-14 MED ORDER — FENTANYL CITRATE (PF) 100 MCG/2ML IJ SOLN
INTRAMUSCULAR | Status: DC | PRN
  Administered 2017-11-14 (×4): 25 ug via INTRAVENOUS

## 2017-11-14 MED ORDER — DOCUSATE SODIUM 100 MG PO CAPS
100.0000 mg | ORAL_CAPSULE | Freq: Two times a day (BID) | ORAL | Status: DC
  Administered 2017-11-14 – 2017-11-16 (×4): 100 mg via ORAL
  Filled 2017-11-14 (×4): qty 1

## 2017-11-14 MED ORDER — BISACODYL 10 MG RE SUPP: 10.0000 mg | Freq: Every day | RECTAL | Status: DC | PRN

## 2017-11-14 MED ORDER — TRAZODONE HCL 50 MG PO TABS: 50.0000 mg | ORAL_TABLET | Freq: Every evening | ORAL | Status: DC | PRN

## 2017-11-14 MED ORDER — KETAMINE HCL 50 MG/ML IJ SOLN
INTRAMUSCULAR | Status: DC | PRN
  Administered 2017-11-14: 25 mg via INTRAVENOUS

## 2017-11-14 MED ORDER — ALBUTEROL SULFATE (2.5 MG/3ML) 0.083% IN NEBU: 2.5000 mg | INHALATION_SOLUTION | Freq: Four times a day (QID) | RESPIRATORY_TRACT | Status: DC | PRN

## 2017-11-14 MED ORDER — GLYCOPYRROLATE 0.2 MG/ML IJ SOLN
INTRAMUSCULAR | Status: DC | PRN
  Administered 2017-11-14: 0.1 mg via INTRAVENOUS

## 2017-11-14 MED ORDER — ALBUTEROL SULFATE (2.5 MG/3ML) 0.083% IN NEBU
2.5000 mg | INHALATION_SOLUTION | Freq: Four times a day (QID) | RESPIRATORY_TRACT | Status: DC
  Administered 2017-11-14: 2.5 mg via RESPIRATORY_TRACT
  Filled 2017-11-14: qty 3

## 2017-11-14 MED ORDER — OXYCODONE HCL 5 MG PO TABS: 5.0000 mg | ORAL_TABLET | Freq: Once | ORAL | Status: DC | PRN

## 2017-11-14 MED ORDER — MIDAZOLAM HCL 5 MG/5ML IJ SOLN
INTRAMUSCULAR | Status: DC | PRN
  Administered 2017-11-14: 1 mg via INTRAVENOUS

## 2017-11-14 MED ORDER — OXYCODONE HCL 5 MG PO TABS: 5.0000 mg | ORAL_TABLET | ORAL | Status: DC | PRN

## 2017-11-14 MED ORDER — PROPOFOL 500 MG/50ML IV EMUL
INTRAVENOUS | Status: DC | PRN
  Administered 2017-11-14: 25 ug/kg/min via INTRAVENOUS

## 2017-11-14 MED ORDER — ACETAMINOPHEN 325 MG PO TABS: 650.0000 mg | ORAL_TABLET | Freq: Four times a day (QID) | ORAL | Status: DC | PRN

## 2017-11-14 MED ORDER — ONDANSETRON HCL 4 MG/2ML IJ SOLN: 4.0000 mg | Freq: Four times a day (QID) | INTRAMUSCULAR | Status: DC | PRN

## 2017-11-14 MED ORDER — FLEET ENEMA 7-19 GM/118ML RE ENEM: 1.0000 | ENEMA | Freq: Once | RECTAL | Status: DC | PRN

## 2017-11-14 MED ORDER — CEFAZOLIN SODIUM 1 G IJ SOLR
INTRAMUSCULAR | Status: AC
  Filled 2017-11-14: qty 20

## 2017-11-14 MED ORDER — FAMOTIDINE 20 MG PO TABS: 20.0000 mg | ORAL_TABLET | Freq: Every day | ORAL | Status: DC | PRN

## 2017-11-14 MED ORDER — FINASTERIDE 5 MG PO TABS
5.0000 mg | ORAL_TABLET | Freq: Every day | ORAL | Status: DC
  Administered 2017-11-15 – 2017-11-16 (×2): 5 mg via ORAL
  Filled 2017-11-14 (×2): qty 1

## 2017-11-14 MED ORDER — LIDOCAINE HCL (PF) 2 % IJ SOLN
INTRAMUSCULAR | Status: AC
  Filled 2017-11-14: qty 10

## 2017-11-14 MED ORDER — ADULT MULTIVITAMIN W/MINERALS CH
1.0000 | ORAL_TABLET | Freq: Every day | ORAL | Status: DC
  Administered 2017-11-15 – 2017-11-16 (×2): 1 via ORAL
  Filled 2017-11-14 (×2): qty 1

## 2017-11-14 MED ORDER — MIDAZOLAM HCL 2 MG/2ML IJ SOLN
INTRAMUSCULAR | Status: AC
  Filled 2017-11-14: qty 2

## 2017-11-14 MED ORDER — METOCLOPRAMIDE HCL 10 MG PO TABS: 5.0000 mg | ORAL_TABLET | Freq: Three times a day (TID) | ORAL | Status: DC | PRN

## 2017-11-14 MED ORDER — HYDRALAZINE HCL 20 MG/ML IJ SOLN: 10.0000 mg | INTRAMUSCULAR | Status: DC | PRN

## 2017-11-14 MED ORDER — FENTANYL CITRATE (PF) 100 MCG/2ML IJ SOLN: 25.0000 ug | INTRAMUSCULAR | Status: DC | PRN

## 2017-11-14 MED ORDER — SENNOSIDES-DOCUSATE SODIUM 8.6-50 MG PO TABS: 1.0000 | ORAL_TABLET | Freq: Every evening | ORAL | Status: DC | PRN

## 2017-11-14 MED ORDER — CALCIUM CITRATE-VITAMIN D 500-500 MG-UNIT PO CHEW
1.0000 | CHEWABLE_TABLET | Freq: Two times a day (BID) | ORAL | Status: DC
  Administered 2017-11-15 – 2017-11-16 (×3): 1 via ORAL
  Filled 2017-11-14 (×5): qty 1

## 2017-11-14 MED ORDER — HYDROMORPHONE HCL 1 MG/ML IJ SOLN: 0.2500 mg | INTRAMUSCULAR | Status: DC | PRN

## 2017-11-14 MED ORDER — CEFAZOLIN SODIUM-DEXTROSE 2-3 GM-%(50ML) IV SOLR
INTRAVENOUS | Status: DC | PRN
  Administered 2017-11-14: 2 g via INTRAVENOUS

## 2017-11-14 MED ORDER — BUPIVACAINE HCL (PF) 0.5 % IJ SOLN
INTRAMUSCULAR | Status: AC
  Filled 2017-11-14: qty 10

## 2017-11-14 MED ORDER — LIDOCAINE HCL (PF) 2 % IJ SOLN
INTRAMUSCULAR | Status: DC | PRN
  Administered 2017-11-14: 50 mg

## 2017-11-14 MED ORDER — SODIUM CHLORIDE 0.9 % IV SOLN
INTRAVENOUS | Status: DC
  Administered 2017-11-14: 22:00:00 via INTRAVENOUS

## 2017-11-14 MED ORDER — METHOCARBAMOL 500 MG PO TABS: 500.0000 mg | ORAL_TABLET | Freq: Four times a day (QID) | ORAL | Status: DC | PRN

## 2017-11-14 MED ORDER — ACETAMINOPHEN 500 MG PO TABS
1000.0000 mg | ORAL_TABLET | Freq: Three times a day (TID) | ORAL | Status: AC
  Administered 2017-11-14 – 2017-11-15 (×3): 1000 mg via ORAL
  Filled 2017-11-14 (×4): qty 2

## 2017-11-14 MED ORDER — OXYCODONE HCL 5 MG PO TABS: 2.5000 mg | ORAL_TABLET | ORAL | Status: DC | PRN

## 2017-11-14 MED ORDER — PROPOFOL 10 MG/ML IV BOLUS
INTRAVENOUS | Status: AC
  Filled 2017-11-14: qty 20

## 2017-11-14 MED ORDER — LACTATED RINGERS IV SOLN
INTRAVENOUS | Status: DC | PRN
  Administered 2017-11-14: 18:00:00 via INTRAVENOUS

## 2017-11-14 MED ORDER — ENOXAPARIN SODIUM 40 MG/0.4ML ~~LOC~~ SOLN
40.0000 mg | SUBCUTANEOUS | Status: DC
  Administered 2017-11-15: 40 mg via SUBCUTANEOUS
  Filled 2017-11-14: qty 0.4

## 2017-11-14 MED ORDER — OXYCODONE HCL 5 MG/5ML PO SOLN: 5.0000 mg | Freq: Once | ORAL | Status: DC | PRN

## 2017-11-14 MED ORDER — HYDROCODONE-ACETAMINOPHEN 5-325 MG PO TABS: 1.0000 | ORAL_TABLET | ORAL | Status: DC | PRN

## 2017-11-14 MED ORDER — POLYETHYLENE GLYCOL 3350 17 G PO PACK: 17.0000 g | PACK | Freq: Every day | ORAL | Status: DC | PRN

## 2017-11-14 MED ORDER — FENTANYL CITRATE (PF) 100 MCG/2ML IJ SOLN
INTRAMUSCULAR | Status: AC
  Filled 2017-11-14: qty 2

## 2017-11-14 SURGICAL SUPPLY — 47 items
"PENCIL ELECTRO HAND CTR " (MISCELLANEOUS) ×1 IMPLANT
BIT DRILL AO GAMMA 4.2X180 (BIT) ×4 IMPLANT
BLADE SURG 15 STRL LF DISP TIS (BLADE) ×1 IMPLANT
BLADE SURG 15 STRL SS (BLADE) ×3
CANISTER SUCT 1200ML W/VALVE (MISCELLANEOUS) ×3 IMPLANT
CHLORAPREP W/TINT 26ML (MISCELLANEOUS) ×3 IMPLANT
DRAPE SHEET LG 3/4 BI-LAMINATE (DRAPES) ×5 IMPLANT
DRAPE SURG 17X11 SM STRL (DRAPES) ×6 IMPLANT
DRAPE TABLE BACK 80X90 (DRAPES) ×2 IMPLANT
DRAPE U-SHAPE 47X51 STRL (DRAPES) ×6 IMPLANT
DRSG OPSITE POSTOP 3X4 (GAUZE/BANDAGES/DRESSINGS) ×9 IMPLANT
ELECT REM PT RETURN 9FT ADLT (ELECTROSURGICAL) ×3
ELECTRODE REM PT RTRN 9FT ADLT (ELECTROSURGICAL) ×1 IMPLANT
GLOVE BIOGEL PI IND STRL 8 (GLOVE) ×1 IMPLANT
GLOVE BIOGEL PI INDICATOR 8 (GLOVE) ×6
GLOVE SURG SYN 7.5  E (GLOVE) ×6
GLOVE SURG SYN 7.5 E (GLOVE) ×3 IMPLANT
GLOVE SURG SYN 7.5 PF PI (GLOVE) ×1 IMPLANT
GOWN STRL REUS W/ TWL LRG LVL3 (GOWN DISPOSABLE) ×1 IMPLANT
GOWN STRL REUS W/ TWL XL LVL3 (GOWN DISPOSABLE) ×1 IMPLANT
GOWN STRL REUS W/TWL LRG LVL3 (GOWN DISPOSABLE) ×6
GOWN STRL REUS W/TWL XL LVL3 (GOWN DISPOSABLE) ×3
GUIDEROD T2 3X1000 (ROD) ×2 IMPLANT
K-WIRE  3.2X450M STR (WIRE) ×2
K-WIRE 3.2X450M STR (WIRE) ×1
KIT NAIL LONG 10X380MMX125 (Nail) ×2 IMPLANT
KIT PATIENT CARE HANA TABLE (KITS) ×3 IMPLANT
KIT TURNOVER KIT A (KITS) ×3 IMPLANT
KWIRE 3.2X450M STR (WIRE) IMPLANT
MAT BLUE FLOOR 46X72 FLO (MISCELLANEOUS) ×4 IMPLANT
NDL FILTER BLUNT 18X1 1/2 (NEEDLE) ×1 IMPLANT
NEEDLE FILTER BLUNT 18X 1/2SAF (NEEDLE) ×2
NEEDLE FILTER BLUNT 18X1 1/2 (NEEDLE) ×1 IMPLANT
NEEDLE HYPO 22GX1.5 SAFETY (NEEDLE) ×3 IMPLANT
NS IRRIG 1000ML POUR BTL (IV SOLUTION) ×3 IMPLANT
PACK HIP COMPR (MISCELLANEOUS) ×3 IMPLANT
PENCIL ELECTRO HAND CTR (MISCELLANEOUS) ×3 IMPLANT
REAMER SHAFT BIXCUT (INSTRUMENTS) ×2 IMPLANT
SCREW LAG GAMMA 3 TI 10.5X100M (Screw) ×2 IMPLANT
SCREW LOCKING FULL THREAD 5X52 (Screw) ×2 IMPLANT
SCREW LOCKING T2 F/T  5MMX50MM (Screw) ×2 IMPLANT
SCREW LOCKING T2 F/T 5MMX50MM (Screw) IMPLANT
STAPLER SKIN PROX 35W (STAPLE) ×3 IMPLANT
SUT VIC AB 2-0 CT2 27 (SUTURE) ×3 IMPLANT
SYR 10ML LL (SYRINGE) ×3 IMPLANT
SYR 30ML LL (SYRINGE) ×3 IMPLANT
TAPE CLOTH 3X10 WHT NS LF (GAUZE/BANDAGES/DRESSINGS) ×6 IMPLANT

## 2017-11-14 NOTE — Progress Notes (Signed)
Family Meeting Note  Advance Directive:yes  Today a meeting took place with the Patient.  Patient is able to participate   The following clinical team members were present during this meeting:MD  The following were discussed:Patient's diagnosis: Hip fracture, Patient's progosis: Unable to determine and Goals for treatment: Full Code  Additional follow-up to be provided: prn  Time spent during discussion:20 minutes  Gorden Harms, MD

## 2017-11-14 NOTE — ED Provider Notes (Signed)
[facility] Emergency Department Provider Note ____________________________________________   First MD Initiated Contact with Patient 11/14/17 0825     (approximate)  I have reviewed the triage vital signs and the nursing notes.   HISTORY  Chief Complaint Fall    HPI Corey Todd is a 84 y.o. male with PMH as noted below who presents with right hip injury, acute onset yesterday evening, associated with inability to bear weight on the right leg as well as with shortening and deformity of the right hip.  The patient states that he had a mechanical fall.  He denies back pain, head injury, or any other injuries.  He states that he was helped into recliner and initially did not want to come to the hospital but this morning when he had persistent pain and was not able to bear weight he decided he needed to get it checked.  Past Medical History:  Diagnosis Date  . Anxiety   . Arthritis   . BPH (benign prostatic hypertrophy)   . DVT (deep vein thrombosis) in pregnancy ([place])   . Presbyacusis    right sided hearing aid    Patient Active Problem List   Diagnosis Date Noted  . Right leg swelling 10/17/2017  . Right leg weakness 10/17/2017  . Herpes zoster without complication 90/24/0973  . Encounter for preventive health examination 02/14/2017  . Skin lesion of face 09/09/2015  . Counseling regarding advanced directives and goals of care 12/26/2014  . Do not resuscitate discussion 12/26/2014  . Hypertension 08/07/2014  . Encounter for Medicare annual wellness exam 11/18/2012  . GERD (gastroesophageal reflux disease) 05/09/2012  . Obesity (BMI 30-39.9) 05/09/2012  . Presbyacusis   . BPH (benign prostatic hyperplasia)     Past Surgical History:  Procedure Laterality Date  . EYE SURGERY     cataract surgery  . HERNIA REPAIR    . JOINT REPLACEMENT  1953   ankle crush injury, right  . PROSTATE SURGERY     biopsy   . TONSILLECTOMY AND ADENOIDECTOMY   1936  . TOTAL HIP ARTHROPLASTY  2007   left , Dr Cay Schillings    Prior to Admission medications   Medication Sig Start Date End Date Taking? Authorizing Provider  aspirin 81 MG tablet Take 81 mg by mouth daily.   Yes [provider]  doxazosin (CARDURA) 2 MG tablet TAKE 1 TABLET BY MOUTH AT  BEDTIME 07/09/17  Yes Crecencio Mc, MD  finasteride (PROSCAR) 5 MG tablet TAKE 1 TABLET BY MOUTH  DAILY 09/02/17  Yes Crecencio Mc, MD  Multiple Vitamin (MULTIVITAMIN) tablet Take 1 tablet by mouth daily.   Yes [provider]  polyethylene glycol powder (MIRALAX) powder Take 17 g by mouth every 3 (three) days.   Yes [provider]  ranitidine (ZANTAC) 75 MG tablet Take 75 mg by mouth 2 (two) times daily as needed for heartburn.    Yes [provider]    Allergies Patient has no known allergies.  Family History  Problem Relation Age of Onset  . Arthritis Mother   . Kidney cancer Neg Hx   . Kidney disease Neg Hx   . Prostate cancer Neg Hx     Social History Social History   Tobacco Use  . Smoking status: Former Smoker    Last attempt to quit: 04/16/1954    Years since quitting: 63.6  . Smokeless tobacco: Never Used  Substance Use Topics  . Alcohol use: No  .  Drug use: No    Review of Systems  Constitutional: No fever/chills. Eyes: No redness. ENT: No neck pain. Cardiovascular: Denies chest pain. Respiratory: Denies shortness of breath. Gastrointestinal: No abdominal pain. Genitourinary: Negative for flank pain.  Musculoskeletal: Negative for back pain.  Positive for right hip injury. Skin: Negative for abrasions or lacerations. Neurological: Negative for headache.   ____________________________________________   PHYSICAL EXAM:  VITAL SIGNS: ED Triage Vitals  Enc Vitals Group     BP 11/14/17 0816 (!) 143/65     Pulse Rate 11/14/17 0816 79     Resp 11/14/17 0816 18     Temp 11/14/17 0816 98.1 F (36.7 C)     Temp Source 11/14/17  0816 Oral     SpO2 11/14/17 0816 97 %     Weight 11/14/17 0818 185 lb (83.9 kg)     Height 11/14/17 0818 5\' 8"  (1.727 m)     Head Circumference --      Peak Flow --      Pain Score 11/14/17 0817 0     Pain Loc --      Pain Edu? --      Excl. in [place]? --     Constitutional: Alert and oriented. Well appearing and in no acute distress. Eyes: Conjunctivae are normal.  Head: Atraumatic. Nose: No congestion/rhinnorhea. Mouth/Throat: Mucous membranes are moist.   Neck: Normal range of motion.  Cardiovascular: Good peripheral circulation. Respiratory: Normal respiratory effort.  Gastrointestinal: No distention.  Genitourinary: No flank tenderness. Musculoskeletal: No lower extremity edema.  Extremities warm and well perfused.  Right lower extremity shortened and externally rotated.  2+ distal pulse.  Pain on range of motion of right hip. Neurologic:  Normal speech and language. No gross focal neurologic deficits are appreciated.  Skin:  Skin is warm and dry. No rash noted. Psychiatric: Mood and affect are normal. Speech and behavior are normal.  ____________________________________________   LABS (all labs ordered are listed, but only abnormal results are displayed)  Labs Reviewed  BASIC METABOLIC PANEL - Abnormal; Notable for the following components:      Result Value   Glucose, Bld 106 (*)    Calcium 8.4 (*)    GFR calc non Af Amer 52 (*)    All other components within normal limits  CBC WITH DIFFERENTIAL/PLATELET - Abnormal; Notable for the following components:   WBC 12.9 (*)    RBC 4.30 (*)    Hemoglobin 12.7 (*)    HCT 37.7 (*)    Neutro Abs 9.4 (*)    Monocytes Absolute 1.2 (*)    All other components within normal limits  PROTIME-INR   ____________________________________________  EKG   ____________________________________________  RADIOLOGY  XR right hip: Intertrochanteric fracture ____________________________________________   PROCEDURES  Procedure(s)  performed: No  Procedures  Critical Care performed: No ____________________________________________   INITIAL IMPRESSION / ASSESSMENT AND PLAN / ED COURSE  Pertinent labs & imaging results that were available during my care of the patient were reviewed by me and considered in my medical decision making (see chart for details).  84 year old male with PMH as noted above presents with right hip injury after mechanical fall last night.  He denies other injuries or other acute pain.  He is unable to bear weight on the right leg.  On exam, the patient's right lower extremity is shortened and rotated.  There is pain on range of motion of the right hip.  The extremity is neuro/vascular intact.  There is no evidence  of other injury.  The presentation is concerning for acute hip fracture will obtain x-ray, preoperative labs, and consult orthopedics.  The patient is on aspirin but no other anticoagulants.  ----------------------------------------- 10:43 AM on 11/14/2017 -----------------------------------------  X-ray reveals right intertrochanteric hip fracture.  I consulted Dr. Posey Pronto from orthopedics who advises that the patient should be n.p.o. and he will likely operate on him later today.  I informed the patient and his family about the results of the imaging and the plan of care.  I signed the patient out to the hospitalist Dr. Jerelyn Charles.   ____________________________________________   FINAL CLINICAL IMPRESSION(S) / ED DIAGNOSES  Final diagnoses:  Closed fracture of right hip, initial encounter ([place])      NEW MEDICATIONS STARTED DURING THIS VISIT:  New Prescriptions   No medications on file     Note:  This document was prepared using Dragon voice recognition software and may include unintentional dictation errors.    Arta Silence, MD 11/14/17 1044

## 2017-11-14 NOTE — ED Notes (Signed)
Patient transported to X-ray 

## 2017-11-14 NOTE — Op Note (Signed)
DATE OF SURGERY: 11/14/2017  PREOPERATIVE DIAGNOSIS: Right intertrochanteric hip fracture  POSTOPERATIVE DIAGNOSIS: Right intertrochanteric hip fracture  PROCEDURE: Intramedullary nailing of R femur with cephalomedullary device  SURGEON: Cato Mulligan, MD  ANESTHESIA: spinal  EBL: 100 cc  IVF: per anesthesia record  COMPONENTS:  Stryker Long Gamma Nail: 10x373mm; 158mm lag screw; 2 distal interolocking screws.   INDICATIONS: Corey Todd is a 84 y.o. male who sustained an intertrochanteric fracture after a fall. Risks and benefits of intramedullary nailing were explained to the patient. Risks include but are not limited to bleeding, infection, injury to tissues, nerves, vessels, nonunion/malunion, hardware failure, limb length discrepancy/hip rotation mismatch and risks of anesthesia. The patient understands these risks, has completed an informed consent, and wishes to proceed.   PROCEDURE:  The patient was brought into the operating room. After administering anesthesia, the patient was placed in the supine position on the Hana table. The uninjured leg was placed in an extended position while the injured lower extremity was placed in longitudinal traction. The fracture was reduced using longitudinal traction with neutral rotation. The adequacy of reduction was verified fluoroscopically in AP and lateral projections and found to be acceptable. The lateral aspect of the right hip and thigh were prepped with ChloraPrep solution before being draped sterilely. Preoperative IV antibiotics were administered. A timeout was performed to verify the appropriate surgical site, patient, and procedure.    The greater trochanter was identified and an approximately 6 cm incision was made about 3 fingerbreadths above the tip of the greater trochanter. The incision was carried down through the subcutaneous tissues to expose the gluteal fascia. This was split the length of the incision, providing access to  the tip of the trochanter. Under fluoroscopic guidance, a guidewire was drilled through the tip of the trochanter into the proximal metaphysis to the level of the lesser trochanter. After verifying its position fluoroscopically in AP and lateral projections, it was overreamed with the opening reamer to the level of the lesser trochanter. A guidewire was passed down through the femoral canal to the supracondylar region. The adequacy of guidewire position was verified fluoroscopically in AP and lateral projections before the length of the guidewire within the canal was measured and a nail of appropriate length was selected. The guidewire was overreamed sequentially using the flexible reamers. The Stryker Gamma Nail was selected and advanced to the appropriate depth as verified fluoroscopically.    The guide system for the lag screw was positioned and advanced through an approximately 3cm stab incision over the lateral aspect of the proximal femur. The guidewire was drilled up through the femoral nail and into the femoral neck to rest within 5 mm of subchondral bone. After verifying its position in the femoral neck and head in both AP and lateral projections, the guidewire was measured and appropriate sized lag screw was selected. The guidewire was overreamed to the appropriate depth before the lag screw was inserted and advanced to the appropriate depth as verified fluoroscopically in AP and lateral projections. The lag screw was advanced, and the fracture was compressed as needed. The set screw was tightened and then untightened a quarter turn to allow for compression. Again, the adequacy of hardware position and fracture reduction was verified fluoroscopically in AP and lateral projections.   Attention was directed distally. Using the "perfect circle" technique, the leg and fluoroscopy machine were positioned appropriately. A 2cm stab incision was made over the skin and IT band at the appropriate point before  the drill bit was advanced through the cortex and across the static hole of the nail. This was repeated for the oblong hole as well. Appropriate screw lengths were determined with a measuring guide. Two distal interlocking screws were placed. Again the adequacy of screw position was verified fluoroscopically in AP and lateral projections.   The wounds were irrigated thoroughly with sterile saline solution. Local anesthetic was injected into the wounds. Deep fascia was closed with 0-Vicryl. The subcutaneous tissues were closed using 2-0 Vicryl interrupted sutures. The skin was closed using staples. Sterile occlusive dressings were applied to all wounds. The patient was then transferred to the recovery room in satisfactory condition.   POSTOPERATIVE PLAN: The patient will be WBAT on the operative extremity. Lovenox 40mg /day x 4 weeks to start on POD#1. Perioperative IV antibiotics x 24 hours. PT/OT on POD#1.

## 2017-11-14 NOTE — H&P (Signed)
[facility] [place] NAME: Corey Todd    MR#:  [id number]  DATE OF BIRTH:  10-01-1927  DATE OF ADMISSION:  11/14/2017  PRIMARY CARE PHYSICIAN: Crecencio Mc, MD   REQUESTING/REFERRING PHYSICIAN:   CHIEF COMPLAINT:   Chief Complaint  Patient presents with  . Fall    HISTORY OF PRESENT ILLNESS: Corey Todd  is a 84 y.o. male with a known history per below status post slip and fall last night resulting in right hip pain, patient assisted to recliner where he slept overnight, continued to complain of right hip and lower extremity pain, emergency room patient was found to have right intertrochanteric hip fracture with white count of 12,000, orthopedic surgery did see patient and is planning operative repair later on this evening, patient currently in no apparent distress, resting comfortably in bed, denies pain, patient is now being admitted for acute right intratrochanteric hip fracture status post mechanical fall.  PAST MEDICAL HISTORY:   Past Medical History:  Diagnosis Date  . Anxiety   . Arthritis   . BPH (benign prostatic hypertrophy)   . DVT (deep vein thrombosis) in pregnancy ([place])   . Presbyacusis    right sided hearing aid    PAST SURGICAL HISTORY:  Past Surgical History:  Procedure Laterality Date  . EYE SURGERY     cataract surgery  . HERNIA REPAIR    . JOINT REPLACEMENT  1953   ankle crush injury, right  . PROSTATE SURGERY     biopsy   . TONSILLECTOMY AND ADENOIDECTOMY  1936  . TOTAL HIP ARTHROPLASTY  2007   left , Dr Cay Schillings    SOCIAL HISTORY:  Social History   Tobacco Use  . Smoking status: Former Smoker    Last attempt to quit: 04/16/1954    Years since quitting: 63.6  . Smokeless tobacco: Never Used  Substance Use Topics  . Alcohol use: No    FAMILY HISTORY:  Family History  Problem Relation Age of Onset  . Arthritis Mother   . Kidney cancer Neg Hx   . Kidney disease Neg Hx   . Prostate cancer  Neg Hx     DRUG ALLERGIES: No Known Allergies  REVIEW OF SYSTEMS:   CONSTITUTIONAL: No fever, fatigue or weakness.  EYES: No blurred or double vision.  EARS, NOSE, AND THROAT: No tinnitus or ear pain.  RESPIRATORY: No cough, shortness of breath, wheezing or hemoptysis.  CARDIOVASCULAR: No chest pain, orthopnea, edema.  GASTROINTESTINAL: No nausea, vomiting, diarrhea or abdominal pain.  GENITOURINARY: No dysuria, hematuria.  ENDOCRINE: No polyuria, nocturia,  HEMATOLOGY: No anemia, easy bruising or bleeding SKIN: No rash or lesion. MUSCULOSKELETAL: Right hip pain    NEUROLOGIC: No tingling, numbness, weakness.  PSYCHIATRY: No anxiety or depression.   MEDICATIONS AT HOME:  Prior to Admission medications   Medication Sig Start Date End Date Taking? Authorizing Provider  aspirin 81 MG tablet Take 81 mg by mouth daily.   Yes [provider]  doxazosin (CARDURA) 2 MG tablet TAKE 1 TABLET BY MOUTH AT  BEDTIME 07/09/17  Yes Crecencio Mc, MD  finasteride (PROSCAR) 5 MG tablet TAKE 1 TABLET BY MOUTH  DAILY 09/02/17  Yes Crecencio Mc, MD  Multiple Vitamin (MULTIVITAMIN) tablet Take 1 tablet by mouth daily.   Yes [provider]  polyethylene glycol powder (MIRALAX) powder Take 17 g by mouth every 3 (three) days.   Yes [provider]  ranitidine (ZANTAC) 75  MG tablet Take 75 mg by mouth 2 (two) times daily as needed for heartburn.    Yes [provider]      PHYSICAL EXAMINATION:   VITAL SIGNS: Blood pressure 128/70, pulse 76, temperature 98.1 F (36.7 C), temperature source Oral, resp. rate 12, height 5\' 8"  (1.727 m), weight 83.9 kg (185 lb), SpO2 98 %.  GENERAL:  84 y.o.-year-old patient lying in the bed with no acute distress.  Frail-appearing EYES: Pupils equal, round, reactive to light and accommodation. No scleral icterus. Extraocular muscles intact.  HEENT: Head atraumatic, normocephalic. Oropharynx and nasopharynx clear.  NECK:  Supple, no  jugular venous distention. No thyroid enlargement, no tenderness.  LUNGS: Normal breath sounds bilaterally, no wheezing, rales,rhonchi or crepitation. No use of accessory muscles of respiration.  CARDIOVASCULAR: S1, S2 normal. No murmurs, rubs, or gallops.  ABDOMEN: Soft, nontender, nondistended. Bowel sounds present. No organomegaly or mass.  EXTREMITIES: No pedal edema, cyanosis, or clubbing.  Decreased right lower extremity movement, right hip tenderness NEUROLOGIC: Cranial nerves II through XII are intact. Muscle strength 5/5 in all extremities. Sensation intact. Gait not checked.  PSYCHIATRIC: The patient is alert and oriented x 3.  SKIN: No obvious rash, lesion, or ulcer.   LABORATORY PANEL:   CBC Recent Labs  Lab 11/14/17 0835  WBC 12.9*  HGB 12.7*  HCT 37.7*  PLT 223  MCV 87.7  MCH 29.6  MCHC 33.8  RDW 14.5  LYMPHSABS 2.1  MONOABS 1.2*  EOSABS 0.2  BASOSABS 0.0   ------------------------------------------------------------------------------------------------------------------  Chemistries  Recent Labs  Lab 11/14/17 0835  NA 137  K 3.6  CL 107  CO2 24  GLUCOSE 106*  BUN 23  CREATININE 1.20  CALCIUM 8.4*   ------------------------------------------------------------------------------------------------------------------ estimated creatinine clearance is 44.9 mL/min (by C-G formula based on SCr of 1.2 mg/dL). ------------------------------------------------------------------------------------------------------------------ No results for input(s): TSH, T4TOTAL, T3FREE, THYROIDAB in the last 72 hours.  Invalid input(s): FREET3   Coagulation profile Recent Labs  Lab 11/14/17 0835  INR 1.02   ------------------------------------------------------------------------------------------------------------------- No results for input(s): DDIMER in the last 72  hours. -------------------------------------------------------------------------------------------------------------------  Cardiac Enzymes No results for input(s): CKMB, TROPONINI, MYOGLOBIN in the last 168 hours.  Invalid input(s): CK ------------------------------------------------------------------------------------------------------------------ Invalid input(s): POCBNP  ---------------------------------------------------------------------------------------------------------------  Urinalysis No results found for: COLORURINE, APPEARANCEUR, LABSPEC, PHURINE, GLUCOSEU, HGBUR, BILIRUBINUR, KETONESUR, PROTEINUR, UROBILINOGEN, NITRITE, LEUKOCYTESUR   RADIOLOGY: Dg Chest Portable 1 View  Result Date: 11/14/2017 CLINICAL DATA:  Fall. EXAM: PORTABLE CHEST 1 VIEW COMPARISON:  Radiographs of February 17, 2004. FINDINGS: Stable cardiomediastinal silhouette. Atherosclerosis of thoracic aorta is noted. No pneumothorax or pleural effusion is noted. Calcified granulomata are noted in the right lung. No acute pulmonary disease is noted. Bony thorax is unremarkable. IMPRESSION: No acute cardiopulmonary abnormality seen. Aortic Atherosclerosis (ICD10-I70.0). Electronically Signed   By: Marijo Conception, M.D.   On: 11/14/2017 09:32   Dg Hip Unilat  With Pelvis 2-3 Views Right  Result Date: 11/14/2017 CLINICAL DATA:  Fall.  Right hip pain. EXAM: DG HIP (WITH OR WITHOUT PELVIS) 2-3V RIGHT COMPARISON:  No recent. FINDINGS: Comminuted angulated intertrochanteric right hip fracture is noted. Fracture fragments noted of the right greater and lesser trochanters. Total left hip replacement. Diffuse osteopenia. Degenerative changes lumbar spine and right hip. IMPRESSION: Comminuted angulated intertrochanteric right hip fracture. Electronically Signed   By: Marcello Moores  Register   On: 11/14/2017 09:28   Dg Femur, Min 2 Views Right  Result Date: 11/14/2017 CLINICAL DATA:  Right hip fracture. EXAM: RIGHT FEMUR 2 VIEWS  COMPARISON:  Prior hip series same day. FINDINGS: Comminuted, angulated, displaced right intertrochanteric hip fracture is again noted. Remainder of the right femur is unremarkable. No other associated fractures. Degenerative changes right hip and right knee. IMPRESSION: Comminuted, angulated, displaced right intertrochanteric hip fractures again noted. Remainder of the right femur is unremarkable. Electronically Signed   By: Marcello Moores  Register   On: 11/14/2017 10:41    EKG: Orders placed or performed during the hospital encounter of 11/14/17  . ED EKG  . ED EKG    IMPRESSION AND PLAN: *Acute right intertrochanteric hip fracture Secondary to mechanical fall Admit to regular nursing for bed, Dr. Patel/orthopedic surgery planning repair this evening, continue n.p.o. status, adult pain protocol, continue close medical monitoring  *Acute newly diagnosed osteoporosis/fragility fracture Calcium with vitamin D, Fosamax weekly, will need DEXA scan status post discharge for further evaluation of bone density  *Chronic GERD without esophagitis Stable PPI daily  *Chronic BPH Stable Continue Proscar  *Chronic benign essential hypertension Stable Continue Proscar   All the records are reviewed and case discussed with ED provider. Management plans discussed with the patient, family and they are in agreement.  CODE STATUS:full Advance Directive Documentation     Most Recent Value  Type of Advance Directive  Living will, Healthcare Power of Attorney  Pre-existing out of facility DNR order (yellow form or pink MOST form)  -  "MOST" Form in Place?  -       TOTAL TIME TAKING CARE OF THIS PATIENT: 45 minutes.    Avel Peace Lorella Gomez M.D on 11/14/2017   Between 7am to 6pm - Pager - [phone]  After 6pm go to [email or web address] - password [email or web address] [facility]  Office  [phone]  CC: Primary care physician; Crecencio Mc, MD   Note: This dictation was prepared with  Dragon dictation along with smaller phrase technology. Any transcriptional errors that result from this process are unintentional.

## 2017-11-14 NOTE — OR Nursing (Addendum)
Yellow ring noted in chart at shift change in OR, Glenford Peers, RN visualized placement in front of chart.

## 2017-11-14 NOTE — H&P (Signed)
H&P reviewed. No significant changes noted.  

## 2017-11-14 NOTE — ED Triage Notes (Signed)
Arrives via ems from home. Ems reports pt tripped and fell last night. Pt was assisted to recliner where he slept overnight. Pt has notable outer rotation of right hip/leg with shortening. Pt a&o x 4 on arrival. Pt denied any loc or neurological changes. NAD noted at this time

## 2017-11-14 NOTE — ED Notes (Signed)
Pt has swelling in the right leg and ankle but states this has been an ongoing issue x 3 years, and has been seeing MD for this issue.

## 2017-11-14 NOTE — NC FL2 (Signed)
[place] LEVEL OF CARE SCREENING TOOL     IDENTIFICATION  Patient Name: Corey Todd Birthdate: 07-Dec-1927 Sex: male Admission Date (Current Location): 11/14/2017  [place] and [place] Number:  Engineering geologist and Address:  [facility], [address], [place], [place] [address]      Provider Number: [id number]  Attending Physician Name and Address:  Gorden Harms, MD  Relative Name and Phone Number:       Current Level of Care: Hospital Recommended Level of Care: [place] Prior Approval Number:    Date Approved/Denied:   PASRR Number: [id number])  Discharge Plan: SNF    Current Diagnoses: Patient Active Problem List   Diagnosis Date Noted  . Hip fx ([place]) 11/14/2017  . Right leg swelling 10/17/2017  . Right leg weakness 10/17/2017  . Herpes zoster without complication 75/17/0017  . Encounter for preventive health examination 02/14/2017  . Skin lesion of face 09/09/2015  . Counseling regarding advanced directives and goals of care 12/26/2014  . Do not resuscitate discussion 12/26/2014  . Hypertension 08/07/2014  . Encounter for Medicare annual wellness exam 11/18/2012  . GERD (gastroesophageal reflux disease) 05/09/2012  . Obesity (BMI 30-39.9) 05/09/2012  . Presbyacusis   . BPH (benign prostatic hyperplasia)     Orientation RESPIRATION BLADDER Height & Weight     Self, Time, Situation, Place  Normal Continent Weight: 83.9 kg (185 lb) Height:  5\' 8"  (172.7 cm)  BEHAVIORAL SYMPTOMS/MOOD NEUROLOGICAL BOWEL NUTRITION STATUS      Continent Diet(Diet: NPO for surgery to be advanced. )  AMBULATORY STATUS COMMUNICATION OF NEEDS Skin   Extensive Assist Verbally Surgical wounds                       Personal Care Assistance Level of Assistance  Bathing, Feeding, Dressing Bathing Assistance: Limited assistance Feeding assistance: Independent Dressing Assistance: Limited assistance      Functional Limitations Info  Sight, Hearing, Speech Sight Info: Adequate Hearing Info: Adequate Speech Info: Adequate    SPECIAL CARE FACTORS FREQUENCY  PT (By licensed PT), OT (By licensed OT)     PT Frequency: (5) OT Frequency: (5)            Contractures      Additional Factors Info  Code Status, Allergies Code Status Info: (Full Code. ) Allergies Info: (No Known Allergies. )           Current Medications (11/14/2017):  This is the current hospital active medication list Current Facility-Administered Medications  Medication Dose Route Frequency Provider Last Rate Last Dose  . acetaminophen (TYLENOL) tablet 650 mg  650 mg Oral Q6H PRN Salary, Montell D, MD       Or  . acetaminophen (TYLENOL) suppository 650 mg  650 mg Rectal Q6H PRN Salary, Montell D, MD      . albuterol (PROVENTIL) (2.5 MG/3ML) 0.083% nebulizer solution 2.5 mg  2.5 mg Nebulization Q6H Salary, Montell D, MD      . Derrill Memo ON 11/15/2017] alendronate (FOSAMAX) tablet 70 mg  70 mg Oral Weekly Salary, Montell D, MD      . Derrill Memo ON 11/15/2017] calcium citrate-vitamin D 500-500 MG-UNIT per chewable tablet 1 tablet  1 tablet Oral BID Salary, Montell D, MD      . dextrose 5 %-0.45 % sodium chloride infusion   Intravenous Continuous Salary, Montell D, MD      . doxazosin (CARDURA) tablet 2 mg  2 mg  Oral QHS Salary, Montell D, MD      . famotidine (PEPCID) tablet 20 mg  20 mg Oral Daily Salary, Montell D, MD      . finasteride (PROSCAR) tablet 5 mg  5 mg Oral Daily Salary, Montell D, MD      . hydrALAZINE (APRESOLINE) injection 10 mg  10 mg Intravenous Q4H PRN Salary, Montell D, MD      . HYDROcodone-acetaminophen (NORCO/VICODIN) 5-325 MG per tablet 1-2 tablet  1-2 tablet Oral Q4H PRN Salary, Montell D, MD      . multivitamin tablet 1 tablet  1 tablet Oral Daily Salary, Montell D, MD      . ondansetron (ZOFRAN) tablet 4 mg  4 mg Oral Q6H PRN Salary, Montell D, MD       Or  . ondansetron (ZOFRAN) injection 4 mg  4  mg Intravenous Q6H PRN Salary, Montell D, MD      . polyethylene glycol (MIRALAX / GLYCOLAX) packet 17 g  17 g Oral Daily PRN Salary, Montell D, MD      . traZODone (DESYREL) tablet 50 mg  50 mg Oral QHS PRN Salary, Avel Peace, MD         Discharge Medications: Please see discharge summary for a list of discharge medications.  Relevant Imaging Results:  Relevant Lab Results:   Additional Information (SSN: [id number])  Norita Meigs, Veronia Beets, LCSW

## 2017-11-14 NOTE — Anesthesia Preprocedure Evaluation (Signed)
Anesthesia Evaluation  Patient identified by MRN, date of birth, ID band Patient awake    Reviewed: Allergy & Precautions, H&P , NPO status , Patient's Chart, lab work & pertinent test results  History of Anesthesia Complications Negative for: history of anesthetic complications  Airway Mallampati: III  TM Distance: >3 FB Neck ROM: limited    Dental  (+) Chipped, Poor Dentition, Caps, Implants, Missing   Pulmonary neg shortness of breath, former smoker,           Cardiovascular Exercise Tolerance: Good hypertension, (-) angina(-) Past MI      Neuro/Psych PSYCHIATRIC DISORDERS Anxiety negative neurological ROS     GI/Hepatic Neg liver ROS, GERD  Medicated and Controlled,  Endo/Other  negative endocrine ROS  Renal/GU      Musculoskeletal  (+) Arthritis ,   Abdominal   Peds  Hematology negative hematology ROS (+)   Anesthesia Other Findings Past Medical History: No date: Anxiety No date: Arthritis No date: BPH (benign prostatic hypertrophy) No date: DVT (deep vein thrombosis) in pregnancy ([place]) No date: Presbyacusis     Comment:  right sided hearing aid  Past Surgical History: No date: EYE SURGERY     Comment:  cataract surgery No date: HERNIA REPAIR 1953: JOINT REPLACEMENT     Comment:  ankle crush injury, right No date: PROSTATE SURGERY     Comment:  biopsy  1936: TONSILLECTOMY AND ADENOIDECTOMY 2007: TOTAL HIP ARTHROPLASTY     Comment:  left , Dr Cay Schillings  BMI    Body Mass Index:  28.28 kg/m      Reproductive/Obstetrics negative OB ROS                             Anesthesia Physical Anesthesia Plan  ASA: III  Anesthesia Plan: Spinal   Post-op Pain Management:    Induction:   PONV Risk Score and Plan:   Airway Management Planned: Natural Airway and Nasal Cannula  Additional Equipment:   Intra-op Plan:   Post-operative Plan:   Informed Consent: I have  reviewed the patients History and Physical, chart, labs and discussed the procedure including the risks, benefits and alternatives for the proposed anesthesia with the patient or authorized representative who has indicated his/her understanding and acceptance.   Dental Advisory Given  Plan Discussed with: Anesthesiologist, CRNA and Surgeon  Anesthesia Plan Comments: (Patient reports no bleeding problems and no anticoagulant use.  Plan for spinal with backup GA  Patient consented for risks of anesthesia including but not limited to:  - adverse reactions to medications - risk of bleeding, infection, nerve damage and headache - risk of failed spinal - damage to teeth, lips or other oral mucosa - sore throat or hoarseness - Damage to heart, brain, lungs or loss of life  Patient voiced understanding.)        Anesthesia Quick Evaluation

## 2017-11-14 NOTE — Progress Notes (Signed)
Full consult note to follow.  Called by ED regarding this patient. Imaging and patient chart reviewed. There is a R intertrochanteric hip fracture. Plan for OR later today ~5pm. NPO until OR. Hold anticoagulation. Admit to Hospitalist for medical management and optimization prior to OR. Please page with any questions.

## 2017-11-14 NOTE — Anesthesia Post-op Follow-up Note (Signed)
Anesthesia QCDR form completed.        

## 2017-11-14 NOTE — Transfer of Care (Signed)
Immediate Anesthesia Transfer of Care Note  Patient: Corey Todd  Procedure(s) Performed: INTRAMEDULLARY (IM) NAIL INTERTROCHANTRIC (Right Hip)  Patient Location: PACU  Anesthesia Type:Spinal  Level of Consciousness: awake  Airway & Oxygen Therapy: Patient Spontanous Breathing and Patient connected to face mask oxygen  Post-op Assessment: Report given to RN and Post -op Vital signs reviewed and stable  Post vital signs: Reviewed  Last Vitals:  Vitals Value Taken Time  BP 111/75 11/14/2017  8:15 PM  Temp 36.7 C 11/14/2017  8:15 PM  Pulse 78 11/14/2017  8:15 PM  Resp 22 11/14/2017  8:15 PM  SpO2 90 % 11/14/2017  8:15 PM  Vitals shown include unvalidated device data.  Last Pain:  Vitals:   11/14/17 1656  TempSrc: Oral  PainSc:          Complications: No apparent anesthesia complications

## 2017-11-14 NOTE — Anesthesia Procedure Notes (Signed)
Spinal  Patient location during procedure: OR Staffing Anesthesiologist: Piscitello, Joseph K, MD Resident/CRNA: , , CRNA Performed: resident/CRNA  Preanesthetic Checklist Completed: patient identified, site marked, surgical consent, pre-op evaluation, timeout performed, IV checked, risks and benefits discussed and monitors and equipment checked Spinal Block Patient position: sitting Prep: ChloraPrep and site prepped and draped Patient monitoring: heart rate, continuous pulse ox, blood pressure and cardiac monitor Approach: midline Location: L4-5 Injection technique: single-shot Needle Needle type: Introducer and Pencan  Needle gauge: 24 G Needle length: 9 cm Additional Notes Negative paresthesia. Negative blood return. Positive free-flowing CSF. Expiration date of kit checked and confirmed. Patient tolerated procedure well, without complications.       

## 2017-11-14 NOTE — Clinical Social Work Placement (Signed)
   CLINICAL SOCIAL WORK PLACEMENT  NOTE  Date:  11/14/2017  Patient Details  Name: Corey Todd MRN: [id number] Date of Birth: 1927/09/15  Clinical Social Work is seeking post-discharge placement for this patient at the [place] level of care (*CSW will initial, date and re-position this form in  chart as items are completed):  Yes   Patient/family provided with [place] Work Department's list of facilities offering this level of care within the geographic area requested by the patient (or if unable, by the patient's family).  Yes   Patient/family informed of their freedom to choose among providers that offer the needed level of care, that participate in Medicare, Medicaid or managed care program needed by the patient, have an available bed and are willing to accept the patient.  Yes   Patient/family informed of [place] [facility]'s ownership interest in [facility] and [facility], as well as of the fact that they are under no obligation to receive care at these facilities.  PASRR submitted to EDS on 11/14/17     PASRR number received on 11/14/17     Existing PASRR number confirmed on       FL2 transmitted to all facilities in geographic area requested by pt/family on 11/14/17     FL2 transmitted to all facilities within larger geographic area on       Patient informed that his/her managed care company has contracts with or will negotiate with certain facilities, including the following:            Patient/family informed of bed offers received.  Patient chooses bed at       Physician recommends and patient chooses bed at      Patient to be transferred to   on  .  Patient to be transferred to facility by       Patient family notified on   of transfer.  Name of family member notified:        PHYSICIAN       Additional Comment:    _______________________________________________ Zahi Plaskett, Veronia Beets, LCSW 11/14/2017, 3:08 PM

## 2017-11-14 NOTE — Progress Notes (Signed)
Patient is being admitted to room 152 from ED. A&O x4. HOH. IV fluids started. NPO. Placed on low bed. Bed alarm on for safety. Orders and POC reviewed.

## 2017-11-14 NOTE — Progress Notes (Signed)
Patient sent to OR. NSL in RFA. Signed consent on chart. Friend at bedside.

## 2017-11-14 NOTE — Clinical Social Work Note (Signed)
Clinical Social Work Assessment  Patient Details  Name: Corey Todd MRN: [id number] Date of Birth: 12/16/1927  Date of referral:  11/14/17               Reason for consult:  Facility Placement                Permission sought to share information with:  Facility Contact Representative Permission granted to share information::  Yes, Verbal Permission Granted  Name::      Skilled Nursing Facility   Agency::   [place] [facility]   Relationship::     Contact Information:     Housing/Transportation Living arrangements for the past 2 months:  Single Family Home Source of Information:  Patient Patient Interpreter Needed:  None Criminal Activity/Legal Involvement Pertinent to Current Situation/Hospitalization:  No - Comment as needed Significant Relationships:  Spouse Lives with:  Spouse Do you feel safe going back to the place where you live?  Yes Need for family participation in patient care:  Yes (Comment)  Care giving concerns:  Patient lives in [place] with his wife Corey Todd.    Social Worker assessment / plan:  Clinical Social Worker (CSW) reviewed chart and noted that patient has a hip fracture. Surgery and PT are pending. CSW met with patient and his friend Corey Todd was at bedside. Patient was alert and oriented X4 and was laying in the bed. CSW introduced self and explained role of CSW department. Per patient he lives with his wife in [place]. Per patient he has been a [employer] for 37 years and his friend Corey Todd has been a [employer] for 15 years. CSW explained that PT will evaluate patient after surgery and make a recommendation of home health or SNF. Patient reported that he will need to go to SNF because his wife will not be able to take care of him. CSW explained that [employer] will have to approve SNF. Patient is agreeable to SNF search in [place]. FL2 complete and faxed out. CSW will continue to follow and assist as needed.   Employment status:  Retired Insurance information:   Managed Medicare PT Recommendations:  Not assessed at this time Information / Referral to community resources:  Skilled Nursing Facility  Patient/Family's Response to care:  Patient is agreeable to SNF search in [place].   Patient/Family's Understanding of and Emotional Response to Diagnosis, Current Treatment, and Prognosis:  Patient was very pleasant and thanked CSW for assistance.   Emotional Assessment Appearance:  Appears stated age Attitude/Demeanor/Rapport:    Affect (typically observed):  Accepting, Adaptable, Pleasant Orientation:  Oriented to Self, Oriented to Place, Oriented to  Time, Oriented to Situation Alcohol / Substance use:  Not Applicable Psych involvement (Current and /or in the community):  No (Comment)  Discharge Needs  Concerns to be addressed:  Discharge Planning Concerns Readmission within the last 30 days:  No Current discharge risk:  Dependent with Mobility Barriers to Discharge:  Continued Medical Work up   ,  M, LCSW 11/14/2017, 3:09 PM  

## 2017-11-14 NOTE — Consult Note (Signed)
ORTHOPAEDIC CONSULTATION  REQUESTING PHYSICIAN: Salary, Avel Peace, MD  Chief Complaint:   R hip pain  History of Present Illness: Corey Todd is a 84 y.o. male who had a fall at home last night. He was able to get to recliner and slept there. This AM, he was unable to ambulate. The patient ambulates with a walker at baseline as of ~1-2 months. He ambulated without a walker prior to that, and states he was just limping one morning and has been walking with a walker since.  Pain is described as sharp at its worst and a dull ache at its best.  Pain is rated a 10 out of 10 in severity.  Pain is improved with rest and immobilization.  Pain is worse with any sort of movement.  X-rays in the emergency department show a right intertrochanteric hip fracture.  Past Medical History:  Diagnosis Date  . Anxiety   . Arthritis   . BPH (benign prostatic hypertrophy)   . DVT (deep vein thrombosis) in pregnancy ([place])   . Presbyacusis    right sided hearing aid   Past Surgical History:  Procedure Laterality Date  . EYE SURGERY     cataract surgery  . HERNIA REPAIR    . JOINT REPLACEMENT  1953   ankle crush injury, right  . PROSTATE SURGERY     biopsy   . TONSILLECTOMY AND ADENOIDECTOMY  1936  . TOTAL HIP ARTHROPLASTY  2007   left , Dr Cay Schillings   Social History   Socioeconomic History  . Marital status: Married    Spouse name: Not on file  . Number of children: Not on file  . Years of education: Not on file  . Highest education level: Not on file  Occupational History  . Not on file  Social Needs  . Financial resource strain: Not on file  . Food insecurity:    Worry: Not on file    Inability: Not on file  . Transportation needs:    Medical: Not on file    Non-medical: Not on file  Tobacco Use  . Smoking status: Former Smoker    Last attempt to quit: 04/16/1954    Years since quitting: 63.6  . Smokeless tobacco: Never  Used  Substance and Sexual Activity  . Alcohol use: No  . Drug use: No  . Sexual activity: Not on file  Lifestyle  . Physical activity:    Days per week: Not on file    Minutes per session: Not on file  . Stress: Not on file  Relationships  . Social connections:    Talks on phone: Not on file    Gets together: Not on file    Attends religious service: Not on file    Active member of club or organization: Not on file    Attends meetings of clubs or organizations: Not on file    Relationship status: Not on file  Other Topics Concern  . Not on file  Social History Narrative  . Not on file   Family History  Problem Relation Age of Onset  . Arthritis Mother   . Kidney cancer Neg Hx   . Kidney disease Neg Hx   . Prostate cancer Neg Hx    No Known Allergies Prior to Admission medications   Medication Sig Start Date End Date Taking? Authorizing Provider  aspirin 81 MG tablet Take 81 mg by mouth daily.   Yes [provider]  doxazosin (CARDURA) 2 MG tablet  TAKE 1 TABLET BY MOUTH AT  BEDTIME 07/09/17  Yes Crecencio Mc, MD  finasteride (PROSCAR) 5 MG tablet TAKE 1 TABLET BY MOUTH  DAILY 09/02/17  Yes Crecencio Mc, MD  Multiple Vitamin (MULTIVITAMIN) tablet Take 1 tablet by mouth daily.   Yes [provider]  polyethylene glycol powder (MIRALAX) powder Take 17 g by mouth every 3 (three) days.   Yes [provider]  ranitidine (ZANTAC) 75 MG tablet Take 75 mg by mouth 2 (two) times daily as needed for heartburn.    Yes [provider]   Recent Labs    11/14/17 0835  WBC 12.9*  HGB 12.7*  HCT 37.7*  PLT 223  K 3.6  CL 107  CO2 24  BUN 23  CREATININE 1.20  GLUCOSE 106*  CALCIUM 8.4*  INR 1.02   Dg Chest Portable 1 View  Result Date: 11/14/2017 CLINICAL DATA:  Fall. EXAM: PORTABLE CHEST 1 VIEW COMPARISON:  Radiographs of February 17, 2004. FINDINGS: Stable cardiomediastinal silhouette. Atherosclerosis of thoracic aorta is noted. No  pneumothorax or pleural effusion is noted. Calcified granulomata are noted in the right lung. No acute pulmonary disease is noted. Bony thorax is unremarkable. IMPRESSION: No acute cardiopulmonary abnormality seen. Aortic Atherosclerosis (ICD10-I70.0). Electronically Signed   By: Marijo Conception, M.D.   On: 11/14/2017 09:32   Dg Hip Unilat  With Pelvis 2-3 Views Right  Result Date: 11/14/2017 CLINICAL DATA:  Fall.  Right hip pain. EXAM: DG HIP (WITH OR WITHOUT PELVIS) 2-3V RIGHT COMPARISON:  No recent. FINDINGS: Comminuted angulated intertrochanteric right hip fracture is noted. Fracture fragments noted of the right greater and lesser trochanters. Total left hip replacement. Diffuse osteopenia. Degenerative changes lumbar spine and right hip. IMPRESSION: Comminuted angulated intertrochanteric right hip fracture. Electronically Signed   By: Marcello Moores  Register   On: 11/14/2017 09:28   Dg Femur, Min 2 Views Right  Result Date: 11/14/2017 CLINICAL DATA:  Right hip fracture. EXAM: RIGHT FEMUR 2 VIEWS COMPARISON:  Prior hip series same day. FINDINGS: Comminuted, angulated, displaced right intertrochanteric hip fracture is again noted. Remainder of the right femur is unremarkable. No other associated fractures. Degenerative changes right hip and right knee. IMPRESSION: Comminuted, angulated, displaced right intertrochanteric hip fractures again noted. Remainder of the right femur is unremarkable. Electronically Signed   By: Marcello Moores  Register   On: 11/14/2017 10:41     Positive ROS: All other systems have been reviewed and were otherwise negative with the exception of those mentioned in the HPI and as above.  Physical Exam: BP 128/70   Pulse 76   Temp 98.1 F (36.7 C) (Oral)   Resp 12   Ht 5\' 8"  (1.727 m)   Wt 83.9 kg (185 lb)   SpO2 98%   BMI 28.13 kg/m  General:  Alert, no acute distress Psychiatric:  Patient is competent for consent with normal mood and affect   Cardiovascular:  No pedal edema,  regular rate and rhythm Respiratory:  No wheezing, non-labored breathing GI:  Abdomen is soft and non-tender Skin:  No lesions in the area of chief complaint, no erythema Neurologic:  Sensation intact distally, CN grossly intact Lymphatic:  No axillary or cervical lymphadenopathy  Orthopedic Exam:  RLE: 5/5 DF/PF/EHL SILT grossly over fooot Foot wwp +Log roll/axial load   X-rays:  As above: R intertrochanteric hip fracture  Assessment/Plan: AMITAI DELAUGHTER is a 84 y.o. male with a R intertrochanteric hip fracture   1. I discussed the various  treatment options including both surgical and non-surgical management of her fracture with the patient. We discussed the high risk of perioperative complications due to patient's age and other co-morbidities. After discussion of risks, benefits, and alternatives to surgery, the patient was in agreement to proceed with surgery. The goals of surgery would be to provide adequate pain relief and allow for mobilization. Plan for surgery is R hip cephalomedullary nailing today, 11/14/17.  2. NPO until OR 3. Hold anticoagulation in advance of OR 4. Admit to [facility]   11/14/2017 1:15 PM

## 2017-11-15 ENCOUNTER — Encounter: Payer: Self-pay | Admitting: Orthopedic Surgery

## 2017-11-15 ENCOUNTER — Encounter
Admission: RE | Admit: 2017-11-15 | Discharge: 2017-11-15 | Disposition: A | Discharge status: Home / Self Care | Payer: Medicare Other | Source: Ambulatory Visit | Attending: Internal Medicine | Admitting: Internal Medicine

## 2017-11-15 LAB — COMPREHENSIVE METABOLIC PANEL
ALBUMIN: 3 g/dL — AB (ref 3.5–5.0)
ALK PHOS: 54 U/L (ref 38–126)
ALT: 14 U/L (ref 0–44)
AST: 17 U/L (ref 15–41)
Anion gap: 3 — ABNORMAL LOW (ref 5–15)
BUN: 20 mg/dL (ref 8–23)
CHLORIDE: 108 mmol/L (ref 98–111)
CO2: 26 mmol/L (ref 22–32)
CREATININE: 0.95 mg/dL (ref 0.61–1.24)
Calcium: 7.8 mg/dL — ABNORMAL LOW (ref 8.9–10.3)
GFR calc Af Amer: >60 mL/min (ref >60)
GFR calc non Af Amer: >60 mL/min (ref >60)
GLUCOSE: 114 mg/dL — AB (ref 70–99)
Potassium: 3.7 mmol/L (ref 3.5–5.1)
SODIUM: 137 mmol/L (ref 135–145)
Total Bilirubin: 1.2 mg/dL (ref 0.3–1.2)
Total Protein: 5.7 g/dL — ABNORMAL LOW (ref 6.5–8.1)

## 2017-11-15 MED ORDER — TRAMADOL HCL 50 MG PO TABS: 50.0000 mg | ORAL_TABLET | Freq: Four times a day (QID) | ORAL | 1 refills | Status: DC | PRN

## 2017-11-15 MED ORDER — POLYETHYLENE GLYCOL 3350 17 G PO PACK
17.0000 g | PACK | Freq: Every day | ORAL | Status: DC
  Administered 2017-11-15 – 2017-11-16 (×2): 17 g via ORAL
  Filled 2017-11-15: qty 1

## 2017-11-15 MED ORDER — ENOXAPARIN SODIUM 40 MG/0.4ML ~~LOC~~ SOLN: 40.0000 mg | SUBCUTANEOUS | 0 refills | Status: DC

## 2017-11-15 MED ORDER — OXYCODONE HCL 5 MG PO TABS: 5.0000 mg | ORAL_TABLET | ORAL | 0 refills | Status: DC | PRN

## 2017-11-15 MED ORDER — ONDANSETRON HCL 4 MG PO TABS: 4.0000 mg | ORAL_TABLET | Freq: Four times a day (QID) | ORAL | 0 refills | Status: DC | PRN

## 2017-11-15 NOTE — Progress Notes (Addendum)
Per [facility] admissions coordinator at [facility] she has a private room 218 for patient and [facility] SNF authorization has been received. Patient can D/C to [facility] when medically stable. Patient and his granddaughter Apolonio Schneiders are aware of above.   [employer], LCSW [phone]

## 2017-11-15 NOTE — Discharge Instructions (Signed)
INSTRUCTIONS AFTER Surgery  o Remove items at home which could result in a fall. This includes throw rugs or furniture in walking pathways o ICE to the affected joint every three hours while awake for 30 minutes at a time, for at least the first 3-5 days, and then as needed for pain and swelling.  Continue to use ice for pain and swelling. You may notice swelling that will progress down to the foot and ankle.  This is normal after surgery.  Elevate your leg when you are not up walking on it.   o Continue to use the breathing machine you got in the hospital (incentive spirometer) which will help keep your temperature down.  It is common for your temperature to cycle up and down following surgery, especially at night when you are not up moving around and exerting yourself.  The breathing machine keeps your lungs expanded and your temperature down.   DIET:  As you were doing prior to hospitalization, we recommend a well-balanced diet.  DRESSING / WOUND CARE / SHOWERING  Dressings are waterproof.  Able to shower.  If becomes saturated, can change bandage.  Staples will be removed in 2 weeks at [place]  o Increase activity slowly as tolerated, but follow the weight bearing instructions below.   o No driving for 6 weeks or until further direction given by your physician.  You cannot drive while taking narcotics.  o No lifting or carrying greater than 10 lbs. until further directed by your surgeon. o Avoid periods of inactivity such as sitting longer than an hour when not asleep. This helps prevent blood clots.  o You may return to work once you are authorized by your doctor.     WEIGHT BEARING  Weightbearing as tolerated on the right   EXERCISES Gait training and strengthening with ambulation training.  CONSTIPATION  Constipation is defined medically as fewer than three stools per week and severe constipation as less than one stool per week.  Even if you have a regular  bowel pattern at home, your normal regimen is likely to be disrupted due to multiple reasons following surgery.  Combination of anesthesia, postoperative narcotics, change in appetite and fluid intake all can affect your bowels.   YOU MUST use at least one of the following options; they are listed in order of increasing strength to get the job done.  They are all available over the counter, and you may need to use some, POSSIBLY even all of these options:    Drink plenty of fluids (prune juice may be helpful) and high fiber foods Colace 100 mg by mouth twice a day  Senokot for constipation as directed and as needed Dulcolax (bisacodyl), take with full glass of water  Miralax (polyethylene glycol) once or twice a day as needed.  If you have tried all these things and are unable to have a bowel movement in the first 3-4 days after surgery call either your surgeon or your primary doctor.    If you experience loose stools or diarrhea, hold the medications until you stool forms back up.  If your symptoms do not get better within 1 week or if they get worse, check with your doctor.  If you experience "the worst abdominal pain ever" or develop nausea or vomiting, please contact the office immediately for further recommendations for treatment.   ITCHING:  If you experience itching with your medications, try taking only a single pain pill, or even half a  pain pill at a time.  You can also use Benadryl over the counter for itching or also to help with sleep.   TED HOSE STOCKINGS:  Use stockings on both legs until for at least 2 weeks or as directed by physician office. They may be removed at night for sleeping.  MEDICATIONS:  See your medication summary on the After Visit Summary that nursing will review with you.  You may have some home medications which will be placed on hold until you complete the course of blood thinner medication.  It is important for you to complete the blood thinner medication as  prescribed.  PRECAUTIONS:  If you experience chest pain or shortness of breath - call 911 immediately for transfer to the hospital emergency department.   If you develop a fever greater that 101 F, purulent drainage from wound, increased redness or drainage from wound, foul odor from the wound/dressing, or calf pain - CONTACT YOUR SURGEON.                                                   FOLLOW-UP APPOINTMENTS:  If you do not already have a post-op appointment, please call the office for an appointment to be seen by your surgeon.  Guidelines for how soon to be seen are listed in your After Visit Summary, but are typically between 1-4 weeks after surgery.  OTHER INSTRUCTIONS:     MAKE SURE YOU:   Understand these instructions.   Get help right away if you are not doing well or get worse.    Thank you for letting us be a part of your medical care team.  It is a privilege we respect greatly.  We hope these instructions will help you stay on track for a fast and full recovery!

## 2017-11-15 NOTE — Progress Notes (Signed)
Physical Therapy Treatment Patient Details Name: Corey Todd MRN: [id number] DOB: [birth year] Today's Date: 11/15/2017    History of Present Illness Pt admitted for R hip fracture after fall at home. Pt is now s/p hip nailing. HIstory includes anxiety and DVT.     PT Comments    Pt is making good progress towards goals with decreased assist required for functional mobility. Still needs assist and cues for transfers and posture. Appears motivated to participate. Good endurance with there-ex, progressed to B LE secondary to weakness. Will continue to progress as able.  Follow Up Recommendations  SNF     Equipment Recommendations  None recommended by PT    Recommendations for Other Services       Precautions / Restrictions Precautions Precautions: Fall Restrictions Weight Bearing Restrictions: Yes RLE Weight Bearing: Weight bearing as tolerated    Mobility  Bed Mobility Overal bed mobility: Needs Assistance Bed Mobility: Sit to Supine       Sit to supine: Min assist   General bed mobility comments: able to follow commands well and scoot hip back on bed. Needs assist for bringing up B LEs to bed.   Transfers Overall transfer level: Needs assistance Equipment used: Rolling walker (2 wheeled) Transfers: Sit to/from Stand Sit to Stand: Mod assist         General transfer comment: required cues to push from seated surface. Once standing, able to stand with improved upright posture at this time.   Ambulation/Gait Ambulation/Gait assistance: Mod assist Gait Distance (Feet): 8 Feet Assistive device: Rolling walker (2 wheeled) Gait Pattern/deviations: Step-to pattern     General Gait Details: Able to take steps towards BSC and then steps towards bed. Has difficulty with weightshifting onto R LE as well as picking R LE up to advance stepping. Pt fatigues quickly and begins to demonstrate forward trunk lean. Heavy cues for sequencing needed.   Stairs              Wheelchair Mobility    Modified Rankin (Stroke Patients Only)       Balance                                            Cognition Arousal/Alertness: Awake/alert Behavior During Therapy: WFL for tasks assessed/performed Overall Cognitive Status: Within Functional Limits for tasks assessed                                        Exercises Other Exercises Other Exercises: supine ther-ex performed on B LE including ankle pumps, quad sets, hip abd/add, and SAQ. All ther-ex performed x 10 reps with min assist and cues for sequencing. Other Exercises: Pt transferred to [facility]. Needs cues for reaching back for seated surface prior to sitting down. Unable to have BM. Min/mod assist for transfers using RW.    General Comments        Pertinent Vitals/Pain Pain Assessment: Faces Faces Pain Scale: Hurts a little bit Pain Location: R hip with movement Pain Descriptors / Indicators: Operative site guarding Pain Intervention(s): Limited activity within patient's tolerance    Home Living                      Prior Function  PT Goals (current goals can now be found in the care plan section) Acute Rehab PT Goals Patient Stated Goal: to get stronger PT Goal Formulation: With patient Time For Goal Achievement: 11/29/17 Potential to Achieve Goals: Good Progress towards PT goals: Progressing toward goals    Frequency    BID      PT Plan Current plan remains appropriate    Co-evaluation              AM-PAC PT "6 Clicks" Daily Activity  Outcome Measure  Difficulty turning over in bed (including adjusting bedclothes, sheets and blankets)?: Unable Difficulty moving from lying on back to sitting on the side of the bed? : Unable Difficulty sitting down on and standing up from a chair with arms (e.g., wheelchair, bedside commode, etc,.)?: Unable Help needed moving to and from a bed to chair (including a wheelchair)?: A  Lot Help needed walking in hospital room?: A Lot Help needed climbing 3-5 steps with a railing? : Total 6 Click Score: 8    End of Session Equipment Utilized During Treatment: Gait belt Activity Tolerance: Patient tolerated treatment well Patient left: in bed;with bed alarm set;with SCD's reapplied Nurse Communication: Mobility status PT Visit Diagnosis: Muscle weakness (generalized) (M62.81);History of falling (Z91.81);Difficulty in walking, not elsewhere classified (R26.2);Pain Pain - Right/Left: Right Pain - part of body: Hip     Time: 1355-1431 PT Time Calculation (min) (ACUTE ONLY): 36 min  Charges:  $Gait Training: 8-22 mins $Therapeutic Exercise: 8-22 mins                     Greggory Stallion, PT, DPT [phone]    Corey Todd 11/15/2017, 3:31 PM

## 2017-11-15 NOTE — Progress Notes (Signed)
Patient is A&O x4. HOH. Up with assist x1-2. No c/o pain just some discomfort. Dressing is CD&I. Good appetite. Urinating per urinal at bedside. On low bed, bed alarm on for safety.

## 2017-11-15 NOTE — Anesthesia Postprocedure Evaluation (Signed)
Anesthesia Post Note  Patient: Corey Todd  Procedure(s) Performed: INTRAMEDULLARY (IM) NAIL INTERTROCHANTRIC (Right Hip)  Patient location during evaluation: Nursing Unit Anesthesia Type: Spinal Level of consciousness: awake, awake and alert and oriented Pain management: pain level controlled Vital Signs Assessment: post-procedure vital signs reviewed and stable Respiratory status: spontaneous breathing Cardiovascular status: stable Postop Assessment: no headache, no backache, adequate PO intake, patient able to bend at knees and no apparent nausea or vomiting Anesthetic complications: no     Last Vitals:  Vitals:   11/15/17 0222 11/15/17 0400  BP: 116/65 123/65  Pulse: 92 94  Resp: 17 16  Temp: 37.1 C 37.3 C  SpO2: 95% 94%    Last Pain:  Vitals:   11/15/17 0631  TempSrc:   PainSc: Asleep                 Lasandra Batley,  [facility]

## 2017-11-15 NOTE — Progress Notes (Signed)
  Subjective: 1 Day Post-Op Procedure(s) (LRB): INTRAMEDULLARY (IM) NAIL INTERTROCHANTRIC (Right) Patient reports pain as mild.   Patient seen in rounds with Dr. Posey Pronto. Patient is well, and has had no acute complaints or problems Plan is to go Rehab after hospital stay. Negative for chest pain and shortness of breath Fever: no Gastrointestinal: Negative for nausea and vomiting  Objective: Vital signs in last 24 hours: Temp:  [98.1 F (36.7 C)-99.2 F (37.3 C)] 99.2 F (37.3 C) (08/02 0400) Pulse Rate:  [76-98] 94 (08/02 0400) Resp:  [12-20] 16 (08/02 0400) BP: (98-155)/(54-107) 123/65 (08/02 0400) SpO2:  [94 %-99 %] 94 % (08/02 0400) Weight:  [83.9 kg (185 lb)-84.4 kg (186 lb)] 84.4 kg (186 lb) (08/01 1422)  Intake/Output from previous day:  Intake/Output Summary (Last 24 hours) at 11/15/2017 0550 Last data filed at 11/15/2017 0500 Gross per 24 hour  Intake 3133.33 ml  Output 750 ml  Net 2383.33 ml    Intake/Output this shift: Total I/O In: 2823.3 [P.O.:480; I.V.:2220; IV Piggyback:123.3] Out: 750 [Urine:650; Blood:100]  Labs: Recent Labs    11/14/17 0835  HGB 12.7*   Recent Labs    11/14/17 0835  WBC 12.9*  RBC 4.30*  HCT 37.7*  PLT 223   Recent Labs    11/14/17 0835 11/15/17 0434  NA 137 137  K 3.6 3.7  CL 107 108  CO2 24 26  BUN 23 20  CREATININE 1.20 0.95  GLUCOSE 106* 114*  CALCIUM 8.4* 7.8*   Recent Labs    11/14/17 0835  INR 1.02     EXAM General - Patient is Alert and Oriented Extremity - Sensation intact distally Dorsiflexion/Plantar flexion intact No cellulitis present Compartment soft Dressing/Incision - clean, dry, no drainage Motor Function - intact, moving foot and toes well on exam.   Past Medical History:  Diagnosis Date  . Anxiety   . Arthritis   . BPH (benign prostatic hypertrophy)   . DVT (deep vein thrombosis) in pregnancy ([place])   . Presbyacusis    right sided hearing aid    Assessment/Plan: 1 Day Post-Op  Procedure(s) (LRB): INTRAMEDULLARY (IM) NAIL INTERTROCHANTRIC (Right) Active Problems:   Hip fx (HCC)  Estimated body mass index is 28.28 kg/m as calculated from the following:   Height as of this encounter: 5\' 8"  (1.727 m).   Weight as of this encounter: 84.4 kg (186 lb). Advance diet Up with therapy D/C IV fluids Discharge to SNF when cleared by medicine. Lovenox for 4 weeks. Follow-up at [facility] clinic in 2 weeks for staple removal.  DVT Prophylaxis - Lovenox Weight-Bearing as tolerated to right leg  Reche Dixon, PA-C Orthopaedic Surgery 11/15/2017, 5:50 AM

## 2017-11-15 NOTE — Evaluation (Signed)
Occupational Therapy Evaluation Patient Details Name: Corey Todd MRN: [id number] DOB: 03/02/1928 Today's Date: 11/15/2017    History of Present Illness Pt admitted for R hip fracture after fall at home. Pt is now s/p hip nailing. HIstory includes anxiety and DVT.    Clinical Impression   Met with pt, agreeable with son present this date. Pt with shower seat, grab bars, 2WW/4WW, SPC at baseline. Little-no pain reported throughout session. Supine <> sit completed with mod A, needing support for BLEs. In sitting pt needing initial min A for sitting balance, improved to CGA. Pt relies heavily on BUE support in sitting, when asked to remove BUE pt able to maintain to complete MMT. Mild pain noted in ER/IR in shoulder ROM, no injuries noted at baseline. Sit <> stand completed with max A and 2WW, VC's needed to maintain upright posture and place RLE appropriately in standing. ER of R hip noted, pt fixed with no pain with VC's. Pt attempted to take a step, unable to clear RLE. Needed VC's to not rush to sitting and for hand placement. Pt reports mild pain when sitting and rushing to reposition RLE in extension. Sit <> supine completed with mod A to guide RLEs. Ice applied at end of session, edema noted. Pt will benefit from continued OT service to increase engagement in functional activity.    Follow Up Recommendations  SNF    Equipment Recommendations       Recommendations for Other Services       Precautions / Restrictions Precautions Precautions: Fall Restrictions Weight Bearing Restrictions: Yes RLE Weight Bearing: Weight bearing as tolerated      Mobility Bed Mobility Overal bed mobility: Needs Assistance Bed Mobility: Sit to Supine     Supine to sit: Mod assist Sit to supine: Min assist   General bed mobility comments: able to scoot self back in bed while bed was flat, assist needed to guis BLEs  Transfers Overall transfer level: Needs assistance Equipment used: Rolling  walker (2 wheeled) Transfers: Sit to/from Stand Sit to Stand: Max assist         General transfer comment: required cues for hand placement from bed to walker, cues for upright posture and to not ER RLE    Balance Overall balance assessment: Needs assistance Sitting-balance support: Feet supported;No upper extremity supported Sitting balance-Leahy Scale: Poor Sitting balance - Comments: relies heavily on BUE support, when asked to remove BUE pt maintained balance for ~1 minute   Standing balance support: Bilateral upper extremity supported Standing balance-Leahy Scale: Fair                             ADL either performed or assessed with clinical judgement   ADL Overall ADL's : Needs assistance/impaired Eating/Feeding: Set up   Grooming: Set up   Upper Body Bathing: Sitting;Minimal assistance Upper Body Bathing Details (indicate cue type and reason): needs occasional min A-CGA with sitting balance, relies heavily on BUE support Lower Body Bathing: Moderate assistance;Sit to/from stand   Upper Body Dressing : Set up;Sitting   Lower Body Dressing: Sit to/from stand;Maximal assistance   Toilet Transfer: Maximal assistance;BSC   Toileting- Clothing Manipulation and Hygiene: Maximal assistance;Sit to/from stand               Vision Baseline Vision/History: Wears glasses Wears Glasses: Reading only Patient Visual Report: No change from baseline       Perception     Praxis  Pertinent Vitals/Pain Pain Assessment: Faces Faces Pain Scale: Hurts a little bit Pain Location: R hip with functional mobility Pain Descriptors / Indicators: Operative site guarding Pain Intervention(s): Limited activity within patient's tolerance;Monitored during session;RN gave pain meds during session;Repositioned;Ice applied     Hand Dominance     Extremity/Trunk Assessment Upper Extremity Assessment Upper Extremity Assessment: Generalized weakness   Lower  Extremity Assessment Lower Extremity Assessment: Generalized weakness       Communication Communication Communication: HOH   Cognition Arousal/Alertness: Awake/alert Behavior During Therapy: WFL for tasks assessed/performed Overall Cognitive Status: Within Functional Limits for tasks assessed                                 General Comments: states "I forget things a lot because I'm old", but nothing apparent noted in eval   General Comments  incision with dressing RLE    Exercises Other Exercises Other Exercises: supine ther-ex performed on B LE including ankle pumps, quad sets, hip abd/add, and SAQ. All ther-ex performed x 10 reps with min assist and cues for sequencing. Other Exercises: Pt transferred to [facility]. Needs cues for reaching back for seated surface prior to sitting down. Unable to have BM. Min/mod assist for transfers using RW.   Shoulder Instructions      Home Living Family/patient expects to be discharged to:: Private residence Living Arrangements: Spouse/significant other Available Help at Discharge: Family;Available 24 hours/day Type of Home: House Home Access: Stairs to enter [employer] of Steps: 3   Home Layout: One level     Bathroom Shower/Tub: Occupational psychologist: Standard     Home Equipment: Clinical cytogeneticist - 4 wheels;Walker - 2 wheels;Grab bars - tub/shower;Cane - single point          Prior Functioning/Environment Level of Independence: Independent        Comments: previously ind prior to ankle injury 6 weeks ago. Currently using RW to engage in functional mobility        OT Problem List: Decreased strength;Impaired balance (sitting and/or standing);Pain;Decreased range of motion;Decreased activity tolerance;Decreased knowledge of use of DME or AE      OT Treatment/Interventions: Self-care/ADL training;DME and/or AE instruction;Balance training;Energy conservation    OT Goals(Current goals can  be found in the care plan section) Acute Rehab OT Goals Patient Stated Goal: to get back to being independent OT Goal Formulation: With patient Time For Goal Achievement: 11/29/17 Potential to Achieve Goals: Good  OT Frequency: Min 2X/week   Barriers to D/C:            Co-evaluation              AM-PAC PT "6 Clicks" Daily Activity     Outcome Measure Help from another person eating meals?: A Little Help from another person taking care of personal grooming?: A Little Help from another person toileting, which includes using toliet, bedpan, or urinal?: A Lot Help from another person bathing (including washing, rinsing, drying)?: A Lot Help from another person to put on and taking off regular upper body clothing?: A Little Help from another person to put on and taking off regular lower body clothing?: A Lot 6 Click Score: 15   End of Session Equipment Utilized During Treatment: Gait belt;Rolling walker Nurse Communication: Mobility status;Patient requests pain meds  Activity Tolerance: Patient tolerated treatment well Patient left: in bed;with call bell/phone within reach;with family/visitor present;with SCD's reapplied  OT  Visit Diagnosis: Other abnormalities of gait and mobility (R26.89);Pain;History of falling (Z91.81);Muscle weakness (generalized) (M62.81) Pain - Right/Left: Right Pain - part of body: Hip;Leg                Time: 1445-1520 OT Time Calculation (min): 35 min Charges:  OT General Charges $OT Visit: 1 Visit OT Evaluation $OT Eval Moderate Complexity: 1 Mod OT Treatments $Self Care/Home Management : 8-22 mins  Zenovia Jarred, MSOT, OTR/L  [place] 11/15/2017, 4:05 PM

## 2017-11-15 NOTE — Progress Notes (Signed)
Clinical Education officer, museum (CSW) presented bed offers to patient and he chose [facility]. Per [facility] admissions coordinator at [facility] she will start [facility] SNF authorization today.   [employer], LCSW [phone]

## 2017-11-15 NOTE — Progress Notes (Signed)
[facility] [facility] [place] NAME: Octavis Sheeler    MR#:  [id number]  DATE OF BIRTH:  Mar 14, 1928  SUBJECTIVE:  CHIEF COMPLAINT:   Chief Complaint  Patient presents with  . Fall   Sitting in a chair.  Postop day 1.  Foley catheter removed.  Worked with Physical therapy.  REVIEW OF SYSTEMS:    Review of Systems  Constitutional: Negative for chills and fever.  HENT: Negative for sore throat.   Eyes: Negative for blurred vision, double vision and pain.  Respiratory: Negative for cough, hemoptysis, shortness of breath and wheezing.   Cardiovascular: Negative for chest pain, palpitations, orthopnea and leg swelling.  Gastrointestinal: Negative for abdominal pain, constipation, diarrhea, heartburn, nausea and vomiting.  Genitourinary: Negative for dysuria and hematuria.  Musculoskeletal: Positive for joint pain. Negative for back pain.  Skin: Negative for rash.  Neurological: Negative for sensory change, speech change, focal weakness and headaches.  Endo/Heme/Allergies: Does not bruise/bleed easily.  Psychiatric/Behavioral: Negative for depression. The patient is not nervous/anxious.     DRUG ALLERGIES:  No Known Allergies  VITALS:  Blood pressure 120/67, pulse 88, temperature 98.8 F (37.1 C), temperature source Oral, resp. rate 16, height 5\' 8"  (1.727 m), weight 84.4 kg (186 lb), SpO2 97 %.  PHYSICAL EXAMINATION:   Physical Exam  GENERAL:  84 y.o.-year-old patient lying in the bed with no acute distress.  EYES: Pupils equal, round, reactive to light and accommodation. No scleral icterus. Extraocular muscles intact.  HEENT: Head atraumatic, normocephalic. Oropharynx and nasopharynx clear.  NECK:  Supple, no jugular venous distention. No thyroid enlargement, no tenderness.  LUNGS: Normal breath sounds bilaterally, no wheezing, rales, rhonchi. No use of accessory muscles of respiration.  CARDIOVASCULAR: S1, S2 normal. No murmurs, rubs, or  gallops.  ABDOMEN: Soft, nontender, nondistended. Bowel sounds present. No organomegaly or mass.  EXTREMITIES: No cyanosis, clubbing or edema b/l.    NEUROLOGIC: Cranial nerves II through XII are intact. No focal Motor or sensory deficits b/l.   PSYCHIATRIC: The patient is alert and oriented x 3.  SKIN: No obvious rash, lesion, or ulcer.   LABORATORY PANEL:   CBC Recent Labs  Lab 11/14/17 0835  WBC 12.9*  HGB 12.7*  HCT 37.7*  PLT 223   ------------------------------------------------------------------------------------------------------------------ Chemistries  Recent Labs  Lab 11/15/17 0434  NA 137  K 3.7  CL 108  CO2 26  GLUCOSE 114*  BUN 20  CREATININE 0.95  CALCIUM 7.8*  AST 17  ALT 14  ALKPHOS 54  BILITOT 1.2   ------------------------------------------------------------------------------------------------------------------  Cardiac Enzymes No results for input(s): TROPONINI in the last 168 hours. ------------------------------------------------------------------------------------------------------------------  RADIOLOGY:  Dg Chest Portable 1 View  Result Date: 11/14/2017 CLINICAL DATA:  Fall. EXAM: PORTABLE CHEST 1 VIEW COMPARISON:  Radiographs of February 17, 2004. FINDINGS: Stable cardiomediastinal silhouette. Atherosclerosis of thoracic aorta is noted. No pneumothorax or pleural effusion is noted. Calcified granulomata are noted in the right lung. No acute pulmonary disease is noted. Bony thorax is unremarkable. IMPRESSION: No acute cardiopulmonary abnormality seen. Aortic Atherosclerosis (ICD10-I70.0). Electronically Signed   By: Marijo Conception, M.D.   On: 11/14/2017 09:32   Dg Hip Operative Unilat W Or W/o Pelvis Right  Result Date: 11/14/2017 CLINICAL DATA:  Right intertrochanteric femoral fracture fixation. EXAM: OPERATIVE RIGHT HIP (WITH PELVIS IF PERFORMED) TECHNIQUE: Fluoroscopic spot image(s) were submitted for interpretation post-operatively.  COMPARISON:  11/14/2017 FINDINGS: Fluoroscopic images from intramedullary rod and screw fixation of intertrochanteric right femoral fracture  demonstrate appropriate positioning of the orthopedic hardware. Improved alignment of the right femur. The femoral head is normally aligned within the acetabulum. Butterfly fragment is seen at the level of the lesser trochanter. IMPRESSION: Fluoroscopic images from intramedullary rod and screw fixation of intertrochanteric right femoral fracture without evidence of immediate complications. Electronically Signed   By: Fidela Salisbury M.D.   On: 11/14/2017 20:16   Dg Hip Unilat  With Pelvis 2-3 Views Right  Result Date: 11/14/2017 CLINICAL DATA:  Fall.  Right hip pain. EXAM: DG HIP (WITH OR WITHOUT PELVIS) 2-3V RIGHT COMPARISON:  No recent. FINDINGS: Comminuted angulated intertrochanteric right hip fracture is noted. Fracture fragments noted of the right greater and lesser trochanters. Total left hip replacement. Diffuse osteopenia. Degenerative changes lumbar spine and right hip. IMPRESSION: Comminuted angulated intertrochanteric right hip fracture. Electronically Signed   By: Marcello Moores  Register   On: 11/14/2017 09:28   Dg Femur, Min 2 Views Right  Result Date: 11/14/2017 CLINICAL DATA:  Right hip fracture. EXAM: RIGHT FEMUR 2 VIEWS COMPARISON:  Prior hip series same day. FINDINGS: Comminuted, angulated, displaced right intertrochanteric hip fracture is again noted. Remainder of the right femur is unremarkable. No other associated fractures. Degenerative changes right hip and right knee. IMPRESSION: Comminuted, angulated, displaced right intertrochanteric hip fractures again noted. Remainder of the right femur is unremarkable. Electronically Signed   By: Marcello Moores  Register   On: 11/14/2017 10:41     ASSESSMENT AND PLAN:   *Acute right intertrochanteric hip fracture Status post surgery.  Postop day 1.  Pain medications as needed.  With physical therapy. Skilled  nursing facility likely tomorrow once with radiation available.  Monitor labs.  *Acute newly diagnosed osteoporosis/fragility fracture Calcium with vitamin D, Fosamax weekly, will need DEXA scan status post discharge for further evaluation of bone density  *Chronic GERD without esophagitis Stable PPI daily  *Chronic BPH Stable Continue Proscar  *Chronic benign essential hypertension Stable Continue Proscar   All the records are reviewed and case discussed with Care Management/Social Workerr. Management plans discussed with the patient, family and they are in agreement.  CODE STATUS: FULL CODE  DVT Prophylaxis: SCDs  TOTAL TIME TAKING CARE OF THIS PATIENT: 35 minutes.   POSSIBLE D/C IN 1-2 DAYS, DEPENDING ON CLINICAL CONDITION.  Leia Alf Saeed Toren M.D on 11/15/2017 at 12:50 PM  Between 7am to 6pm - Pager - [phone]  After 6pm go to [email or web address] - password [email or web address] [facility]  Office  [phone]  CC: Primary care physician; Crecencio Mc, MD  Note: This dictation was prepared with Dragon dictation along with smaller phrase technology. Any transcriptional errors that result from this process are unintentional.

## 2017-11-15 NOTE — Evaluation (Signed)
Physical Therapy Evaluation Patient Details Name: Corey Todd MRN: [id number] DOB: 03/25/1928 Today's Date: 11/15/2017   History of Present Illness  Pt admitted for R hip fracture after fall at home. Pt is now s/p hip nailing. HIstory includes anxiety and DVT.   Clinical Impression  Pt is a pleasant 84 year old male who was admitted for R hip fracture and is s/p nailing. Pt performs bed mobility with mod assist +2, transfers with min assist +2, and ambulation with min assist +2 and use of RW. Pt demonstrates deficits with strength/mobility/balance. Pt appears very motivated to perform therapy and has little to no pain. Would benefit from skilled PT to address above deficits and promote optimal return to PLOF; recommend transition to STR upon discharge from acute hospitalization.       Follow Up Recommendations SNF    Equipment Recommendations  None recommended by PT    Recommendations for Other Services       Precautions / Restrictions Precautions Precautions: Fall Restrictions Weight Bearing Restrictions: Yes RLE Weight Bearing: Weight bearing as tolerated      Mobility  Bed Mobility Overal bed mobility: Needs Assistance Bed Mobility: Supine to Sit     Supine to sit: Mod assist;+2 for physical assistance     General bed mobility comments: needs assist with trunk support due to posterior lean and guarding against R side. Also needs assist with sliding B LEs off bed. Once seated, needs min assist to maintain balance, however progresses to CGA.  Transfers Overall transfer level: Needs assistance Equipment used: Rolling walker (2 wheeled) Transfers: Sit to/from Stand Sit to Stand: Min assist;+2 physical assistance         General transfer comment: Needs cues for sequencing and use of AD as he tries to pull up on RW. Once standing, able to WB through R LE although very hesitant. Upright posture noted.  Ambulation/Gait Ambulation/Gait assistance: Min assist;+2  physical assistance Gait Distance (Feet): 3 Feet Assistive device: Rolling walker (2 wheeled) Gait Pattern/deviations: Step-to pattern     General Gait Details: needs heavy cues and guidance for sequencing. Ambulates with decreased foot clearance on B LE and forward flexed posture over RW. Reports slight pain once upright. Fatigues quickly.  Stairs            Wheelchair Mobility    Modified Rankin (Stroke Patients Only)       Balance Overall balance assessment: Needs assistance;History of Falls Sitting-balance support: Feet supported;Bilateral upper extremity supported Sitting balance-Leahy Scale: Fair     Standing balance support: Bilateral upper extremity supported Standing balance-Leahy Scale: Fair                               Pertinent Vitals/Pain Pain Assessment: No/denies pain    Home Living Family/patient expects to be discharged to:: Private residence Living Arrangements: Spouse/significant other Available Help at Discharge: Family;Available 24 hours/day Type of Home: House Home Access: Stairs to enter Entrance Stairs-Rails: Left Entrance Stairs-Number of Steps: 3 Home Layout: One level Home Equipment: Walker - 2 wheels;Walker - 4 wheels      Prior Function Level of Independence: Independent         Comments: was previously independent until 6 weeks ago following R ankle injury. Has been using RW and/or rollater since then.     Hand Dominance        Extremity/Trunk Assessment   Upper Extremity Assessment Upper Extremity Assessment: Overall WFL for  tasks assessed    Lower Extremity Assessment Lower Extremity Assessment: Generalized weakness(R LE grossly 2/5; L LE grossly 3+/5)       Communication   Communication: No difficulties  Cognition Arousal/Alertness: Awake/alert Behavior During Therapy: WFL for tasks assessed/performed Overall Cognitive Status: Within Functional Limits for tasks assessed                                         General Comments      Exercises Other Exercises Other Exercises: supine ther-ex performed on R LE including ankle pumps, quad sets, hip abd/add, and SAQ. All ther-ex performed x 10 reps with min assist and cues for sequencing.   Assessment/Plan    PT Assessment Patient needs continued PT services  PT Problem List Decreased strength;Decreased activity tolerance;Decreased balance;Decreased mobility;Pain       PT Treatment Interventions DME instruction;Gait training;Therapeutic exercise;Balance training    PT Goals (Current goals can be found in the Care Plan section)  Acute Rehab PT Goals Patient Stated Goal: to get stronger PT Goal Formulation: With patient Time For Goal Achievement: 11/29/17 Potential to Achieve Goals: Good    Frequency BID   Barriers to discharge        Co-evaluation               AM-PAC PT "6 Clicks" Daily Activity  Outcome Measure Difficulty turning over in bed (including adjusting bedclothes, sheets and blankets)?: Unable Difficulty moving from lying on back to sitting on the side of the bed? : Unable Difficulty sitting down on and standing up from a chair with arms (e.g., wheelchair, bedside commode, etc,.)?: Unable Help needed moving to and from a bed to chair (including a wheelchair)?: A Lot Help needed walking in hospital room?: A Lot Help needed climbing 3-5 steps with a railing? : Total 6 Click Score: 8    End of Session Equipment Utilized During Treatment: Gait belt Activity Tolerance: Patient tolerated treatment well Patient left: in chair;with chair alarm set;with SCD's reapplied;with family/visitor present Nurse Communication: Mobility status PT Visit Diagnosis: Muscle weakness (generalized) (M62.81);History of falling (Z91.81);Difficulty in walking, not elsewhere classified (R26.2);Pain Pain - Right/Left: Right Pain - part of body: Hip    Time: 9163-8466 PT Time Calculation (min) (ACUTE ONLY):  28 min   Charges:   PT Evaluation $PT Eval Low Complexity: 1 Low PT Treatments $Therapeutic Exercise: 8-22 mins        Greggory Stallion, PT, DPT [phone]   Gadge Hermiz 11/15/2017, 11:10 AM

## 2017-11-15 NOTE — Progress Notes (Signed)
Patient urine output was only 225cc this shift. IV fluids running at 75cc/hr. Stood patient up and had water running without results. Patient has no urge to use the bathroom at this time. Bladder scan showed only 200 cc. Notified MD. New orders Bladder scan patient and if bladder scan over 400 cc, in and out cath patient as needed.

## 2017-11-15 NOTE — OR Nursing (Signed)
Corey Todd [place] visualize ring in pt chart

## 2017-11-16 DIAGNOSIS — M6281 Muscle weakness (generalized): Secondary | ICD-10-CM | POA: Diagnosis not present

## 2017-11-16 DIAGNOSIS — I10 Essential (primary) hypertension: Secondary | ICD-10-CM | POA: Diagnosis not present

## 2017-11-16 DIAGNOSIS — M8000XD Age-related osteoporosis with current pathological fracture, unspecified site, subsequent encounter for fracture with routine healing: Secondary | ICD-10-CM | POA: Diagnosis not present

## 2017-11-16 DIAGNOSIS — M84459A Pathological fracture, hip, unspecified, initial encounter for fracture: Secondary | ICD-10-CM | POA: Diagnosis not present

## 2017-11-16 DIAGNOSIS — N400 Enlarged prostate without lower urinary tract symptoms: Secondary | ICD-10-CM | POA: Diagnosis not present

## 2017-11-16 DIAGNOSIS — Z7401 Bed confinement status: Secondary | ICD-10-CM | POA: Diagnosis not present

## 2017-11-16 DIAGNOSIS — M80051D Age-related osteoporosis with current pathological fracture, right femur, subsequent encounter for fracture with routine healing: Secondary | ICD-10-CM | POA: Diagnosis not present

## 2017-11-16 DIAGNOSIS — K219 Gastro-esophageal reflux disease without esophagitis: Secondary | ICD-10-CM | POA: Diagnosis not present

## 2017-11-16 DIAGNOSIS — Z9181 History of falling: Secondary | ICD-10-CM | POA: Diagnosis not present

## 2017-11-16 DIAGNOSIS — K5903 Drug induced constipation: Secondary | ICD-10-CM | POA: Diagnosis not present

## 2017-11-16 DIAGNOSIS — S72001S Fracture of unspecified part of neck of right femur, sequela: Secondary | ICD-10-CM | POA: Diagnosis not present

## 2017-11-16 DIAGNOSIS — H9111 Presbycusis, right ear: Secondary | ICD-10-CM | POA: Diagnosis not present

## 2017-11-16 DIAGNOSIS — R600 Localized edema: Secondary | ICD-10-CM | POA: Diagnosis not present

## 2017-11-16 DIAGNOSIS — S72001A Fracture of unspecified part of neck of right femur, initial encounter for closed fracture: Secondary | ICD-10-CM | POA: Diagnosis not present

## 2017-11-16 LAB — COMPREHENSIVE METABOLIC PANEL
ALBUMIN: 2.7 g/dL — AB (ref 3.5–5.0)
ALK PHOS: 50 U/L (ref 38–126)
ALT: 12 U/L (ref 0–44)
AST: 22 U/L (ref 15–41)
Anion gap: 4 — ABNORMAL LOW (ref 5–15)
BILIRUBIN TOTAL: 1 mg/dL (ref 0.3–1.2)
BUN: 18 mg/dL (ref 8–23)
CO2: 24 mmol/L (ref 22–32)
Calcium: 7.8 mg/dL — ABNORMAL LOW (ref 8.9–10.3)
Chloride: 109 mmol/L (ref 98–111)
Creatinine, Ser: 0.94 mg/dL (ref 0.61–1.24)
GFR calc Af Amer: >60 mL/min (ref >60)
GFR calc non Af Amer: >60 mL/min (ref >60)
GLUCOSE: 105 mg/dL — AB (ref 70–99)
POTASSIUM: 3.6 mmol/L (ref 3.5–5.1)
Sodium: 137 mmol/L (ref 135–145)
TOTAL PROTEIN: 5.4 g/dL — AB (ref 6.5–8.1)

## 2017-11-16 LAB — HEMOGLOBIN: Hemoglobin: 9.3 g/dL — ABNORMAL LOW (ref 13.0–18.0)

## 2017-11-16 MED ORDER — POLYETHYLENE GLYCOL 3350 17 G PO PACK
17.0000 g | PACK | Freq: Every day | ORAL | 0 refills | Status: DC
Start: 2017-11-17 — End: 2019-06-12

## 2017-11-16 MED ORDER — BISACODYL 10 MG RE SUPP
10.0000 mg | Freq: Once | RECTAL | Status: AC
  Administered 2017-11-16: 10 mg via RECTAL
  Filled 2017-11-16: qty 1

## 2017-11-16 MED ORDER — MAGNESIUM HYDROXIDE 400 MG/5ML PO SUSP: 30.0000 mL | Freq: Every day | ORAL | Status: DC | PRN

## 2017-11-16 NOTE — Clinical Social Work Note (Signed)
The patient will discharge today via non emergent EMS to [facility]. The patient's wife and the facility are aware and in agreement. The documentation has been sent to the facility, and the discharge packet has been delivered to the patient's chart including the hard scripts for narcotics. The CSW is signing off. Please consult should needs arise prior to leaving.  Santiago Bumpers, MSW, Latanya Presser [phone]

## 2017-11-16 NOTE — Plan of Care (Signed)
  Problem: Education: Goal: Knowledge of General Education information will improve Description Including pain rating scale, medication(s)/side effects and non-pharmacologic comfort measures Outcome: Progressing   Problem: Health Behavior/Discharge Planning: Goal: Ability to manage health-related needs will improve Outcome: Progressing   Problem: Clinical Measurements: Goal: Ability to maintain clinical measurements within normal limits will improve Outcome: Progressing Goal: Will remain free from infection Outcome: Progressing Goal: Diagnostic test results will improve Outcome: Progressing Goal: Respiratory complications will improve Outcome: Progressing Goal: Cardiovascular complication will be avoided Outcome: Progressing   Problem: Coping: Goal: Level of anxiety will decrease Outcome: Progressing   Problem: Nutrition: Goal: Adequate nutrition will be maintained Outcome: Progressing   Problem: Elimination: Goal: Will not experience complications related to bowel motility Outcome: Progressing Goal: Will not experience complications related to urinary retention Outcome: Progressing   

## 2017-11-16 NOTE — Progress Notes (Signed)
Report called to [facility], waiting on patient to have a BM.

## 2017-11-16 NOTE — Discharge Summary (Signed)
Corey Todd NAME: Corey Todd    MR#:  [id number]  DATE OF BIRTH:  February 09, 1928  DATE OF ADMISSION:  11/14/2017 ADMITTING PHYSICIAN: Gorden Harms, MD  DATE OF DISCHARGE: 11/16/2017  PRIMARY CARE PHYSICIAN: Crecencio Mc, MD    ADMISSION DIAGNOSIS:  Hip fracture ([place]) [S72.009A] Closed fracture of right hip, initial encounter ([place]) [S72.001A]  DISCHARGE DIAGNOSIS:  Active Problems:   Hip fx ([place])   SECONDARY DIAGNOSIS:   Past Medical History:  Diagnosis Date  . Anxiety   . Arthritis   . BPH (benign prostatic hypertrophy)   . DVT (deep vein thrombosis) in pregnancy ([place])   . Presbyacusis    right sided hearing aid    HOSPITAL COURSE:   84 year old male with known history of BPH who presented to the hospital after mechanical fall. 1.Comminuted angulated intertrochanteric right hip fracture. Patient is postoperative day #2 and doing well.  Physical therapy is recommending skilled nursing facility upon discharge.  Patient will continue with DVT prophylaxis as ordered by orthopedic surgery.  Patient will continue with PRN pain medications.  Patient will need to go to skilled nursing facility upon discharge.  Patient will follow-up with orthopedic surgery in 2 weeks.  2.  BPH: Continue Cardura and Proscar DISCHARGE CONDITIONS AND DIET:   Stable for discharge on regular diet  CONSULTS OBTAINED:  Treatment Team:  Leim Fabry, MD  DRUG ALLERGIES:  No Known Allergies  DISCHARGE MEDICATIONS:   Allergies as of 11/16/2017   No Known Allergies     Medication List    STOP taking these medications   aspirin 81 MG tablet   MIRALAX powder Generic drug:  polyethylene glycol powder Replaced by:  polyethylene glycol packet     TAKE these medications   doxazosin 2 MG tablet Commonly known as:  CARDURA TAKE 1 TABLET BY MOUTH AT  BEDTIME   enoxaparin 40 MG/0.4ML injection Commonly known as:  LOVENOX Inject 0.4 mLs  (40 mg total) into the skin daily.   finasteride 5 MG tablet Commonly known as:  PROSCAR TAKE 1 TABLET BY MOUTH  DAILY   multivitamin tablet Take 1 tablet by mouth daily.   ondansetron 4 MG tablet Commonly known as:  ZOFRAN Take 1 tablet (4 mg total) by mouth every 6 (six) hours as needed for nausea.   oxyCODONE 5 MG immediate release tablet Commonly known as:  Oxy IR/ROXICODONE Take 1-2 tablets (5-10 mg total) by mouth every 4 (four) hours as needed for severe pain (pain score 7-10).   polyethylene glycol packet Commonly known as:  MIRALAX / GLYCOLAX Take 17 g by mouth daily. Start taking on:  11/17/2017 Replaces:  MIRALAX powder   ranitidine 75 MG tablet Commonly known as:  ZANTAC Take 75 mg by mouth 2 (two) times daily as needed for heartburn.   traMADol 50 MG tablet Commonly known as:  ULTRAM Take 1 tablet (50 mg total) by mouth every 6 (six) hours as needed for moderate pain.         Today   CHIEF COMPLAINT:  Patient doing well this morning   VITAL SIGNS:  Blood pressure (!) 136/59, pulse 98, temperature 99.3 F (37.4 C), temperature source Oral, resp. rate 16, height 5\' 8"  (1.727 m), weight 186 lb (84.4 kg), SpO2 97 %.   REVIEW OF SYSTEMS:  Review of Systems  Constitutional: Negative.  Negative for chills, fever and malaise/fatigue.  HENT: Negative.  Negative for ear discharge, ear  pain, hearing loss, nosebleeds and sore throat.   Eyes: Negative.  Negative for blurred vision and pain.  Respiratory: Negative.  Negative for cough, hemoptysis, shortness of breath and wheezing.   Cardiovascular: Negative.  Negative for chest pain, palpitations and leg swelling.  Gastrointestinal: Negative.  Negative for abdominal pain, blood in stool, diarrhea, nausea and vomiting.  Genitourinary: Negative.  Negative for dysuria.  Musculoskeletal: Negative.  Negative for back pain.  Skin: Negative.   Neurological: Negative for dizziness, tremors, speech change, focal  weakness, seizures and headaches.  Endo/Heme/Allergies: Negative.  Does not bruise/bleed easily.  Psychiatric/Behavioral: Negative.  Negative for depression, hallucinations and suicidal ideas.     PHYSICAL EXAMINATION:  GENERAL:  84 y.o.-year-old patient lying in the bed with no acute distress.  NECK:  Supple, no jugular venous distention. No thyroid enlargement, no tenderness.  LUNGS: Normal breath sounds bilaterally, no wheezing, rales,rhonchi  No use of accessory muscles of respiration.  CARDIOVASCULAR: S1, S2 normal. No murmurs, rubs, or gallops.  ABDOMEN: Soft, non-tender, non-distended. Bowel sounds present. No organomegaly or mass.  EXTREMITIES: No pedal edema, cyanosis, or clubbing.  PSYCHIATRIC: The patient is alert and oriented x 3.  SKIN: No obvious rash, lesion, or ulcer.   DATA REVIEW:   CBC Recent Labs  Lab 11/14/17 0835 11/16/17 0524  WBC 12.9*  --   HGB 12.7* 9.3*  HCT 37.7*  --   PLT 223  --     Chemistries  Recent Labs  Lab 11/16/17 0524  NA 137  K 3.6  CL 109  CO2 24  GLUCOSE 105*  BUN 18  CREATININE 0.94  CALCIUM 7.8*  AST 22  ALT 12  ALKPHOS 50  BILITOT 1.0    Cardiac Enzymes No results for input(s): TROPONINI in the last 168 hours.  Microbiology Results  @MICRORSLT48 @  RADIOLOGY:  Dg Chest Portable 1 View  Result Date: 11/14/2017 CLINICAL DATA:  Fall. EXAM: PORTABLE CHEST 1 VIEW COMPARISON:  Radiographs of February 17, 2004. FINDINGS: Stable cardiomediastinal silhouette. Atherosclerosis of thoracic aorta is noted. No pneumothorax or pleural effusion is noted. Calcified granulomata are noted in the right lung. No acute pulmonary disease is noted. Bony thorax is unremarkable. IMPRESSION: No acute cardiopulmonary abnormality seen. Aortic Atherosclerosis (ICD10-I70.0). Electronically Signed   By: Marijo Conception, M.D.   On: 11/14/2017 09:32   Dg Hip Operative Unilat W Or W/o Pelvis Right  Result Date: 11/14/2017 CLINICAL DATA:  Right  intertrochanteric femoral fracture fixation. EXAM: OPERATIVE RIGHT HIP (WITH PELVIS IF PERFORMED) TECHNIQUE: Fluoroscopic spot image(s) were submitted for interpretation post-operatively. COMPARISON:  11/14/2017 FINDINGS: Fluoroscopic images from intramedullary rod and screw fixation of intertrochanteric right femoral fracture demonstrate appropriate positioning of the orthopedic hardware. Improved alignment of the right femur. The femoral head is normally aligned within the acetabulum. Butterfly fragment is seen at the level of the lesser trochanter. IMPRESSION: Fluoroscopic images from intramedullary rod and screw fixation of intertrochanteric right femoral fracture without evidence of immediate complications. Electronically Signed   By: Fidela Salisbury M.D.   On: 11/14/2017 20:16   Dg Hip Unilat  With Pelvis 2-3 Views Right  Result Date: 11/14/2017 CLINICAL DATA:  Fall.  Right hip pain. EXAM: DG HIP (WITH OR WITHOUT PELVIS) 2-3V RIGHT COMPARISON:  No recent. FINDINGS: Comminuted angulated intertrochanteric right hip fracture is noted. Fracture fragments noted of the right greater and lesser trochanters. Total left hip replacement. Diffuse osteopenia. Degenerative changes lumbar spine and right hip. IMPRESSION: Comminuted angulated intertrochanteric right hip fracture. Electronically  Signed   By: Marcello Moores  Register   On: 11/14/2017 09:28   Dg Femur, Min 2 Views Right  Result Date: 11/14/2017 CLINICAL DATA:  Right hip fracture. EXAM: RIGHT FEMUR 2 VIEWS COMPARISON:  Prior hip series same day. FINDINGS: Comminuted, angulated, displaced right intertrochanteric hip fracture is again noted. Remainder of the right femur is unremarkable. No other associated fractures. Degenerative changes right hip and right knee. IMPRESSION: Comminuted, angulated, displaced right intertrochanteric hip fractures again noted. Remainder of the right femur is unremarkable. Electronically Signed   By: Marcello Moores  Register   On:  11/14/2017 10:41      Allergies as of 11/16/2017   No Known Allergies     Medication List    STOP taking these medications   aspirin 81 MG tablet   MIRALAX powder Generic drug:  polyethylene glycol powder Replaced by:  polyethylene glycol packet     TAKE these medications   doxazosin 2 MG tablet Commonly known as:  CARDURA TAKE 1 TABLET BY MOUTH AT  BEDTIME   enoxaparin 40 MG/0.4ML injection Commonly known as:  LOVENOX Inject 0.4 mLs (40 mg total) into the skin daily.   finasteride 5 MG tablet Commonly known as:  PROSCAR TAKE 1 TABLET BY MOUTH  DAILY   multivitamin tablet Take 1 tablet by mouth daily.   ondansetron 4 MG tablet Commonly known as:  ZOFRAN Take 1 tablet (4 mg total) by mouth every 6 (six) hours as needed for nausea.   oxyCODONE 5 MG immediate release tablet Commonly known as:  Oxy IR/ROXICODONE Take 1-2 tablets (5-10 mg total) by mouth every 4 (four) hours as needed for severe pain (pain score 7-10).   polyethylene glycol packet Commonly known as:  MIRALAX / GLYCOLAX Take 17 g by mouth daily. Start taking on:  11/17/2017 Replaces:  MIRALAX powder   ranitidine 75 MG tablet Commonly known as:  ZANTAC Take 75 mg by mouth 2 (two) times daily as needed for heartburn.   traMADol 50 MG tablet Commonly known as:  ULTRAM Take 1 tablet (50 mg total) by mouth every 6 (six) hours as needed for moderate pain.         Management plans discussed with the patient and he is in agreement. Stable for discharge snf  Patient should follow up with ortho  CODE STATUS:     Code Status Orders  (From admission, onward)        Start     Ordered   11/14/17 1421  Full code  Continuous     11/14/17 1420    Code Status History    This patient has a current code status but no historical code status.    Advance Directive Documentation     Most Recent Value  Type of Advance Directive  Living will, Healthcare Power of Attorney  Pre-existing out of facility  DNR order (yellow form or pink MOST form)  -  "MOST" Form in Place?  -      TOTAL TIME TAKING CARE OF THIS PATIENT: 38 minutes.    Note: This dictation was prepared with Dragon dictation along with smaller phrase technology. Any transcriptional errors that result from this process are unintentional.  Ladawn Boullion M.D on 11/16/2017 at 9:19 AM  Between 7am to 6pm - Pager - [phone] After 6pm go to [email or web address] - password [email or web address] [facility]  Office  [phone]  CC: Primary care physician; Crecencio Mc, MD

## 2017-11-16 NOTE — Progress Notes (Signed)
EMS called for transport.

## 2017-11-16 NOTE — Progress Notes (Signed)
  Subjective: 2 Days Post-Op Procedure(s) (LRB): INTRAMEDULLARY (IM) NAIL INTERTROCHANTRIC (Right) Patient reports pain as mild.   Patient is well, and has had no acute complaints or problems Plan is to go Rehab after hospital stay. Negative for chest pain and shortness of breath Fever: no Gastrointestinal: Negative for nausea and vomiting Denies having bowel movement  Objective: Vital signs in last 24 hours: Temp:  [98.3 F (36.8 C)-99.3 F (37.4 C)] 99.3 F (37.4 C) (08/02 2329) Pulse Rate:  [88-98] 98 (08/02 2329) Resp:  [16] 16 (08/02 0720) BP: (120-143)/(59-67) 136/59 (08/02 2329) SpO2:  [95 %-97 %] 97 % (08/02 2329)  Intake/Output from previous day:  Intake/Output Summary (Last 24 hours) at 11/16/2017 0656 Last data filed at 11/16/2017 0400 Gross per 24 hour  Intake 2153 ml  Output 625 ml  Net 1528 ml    Intake/Output this shift: Total I/O In: 1215 [P.O.:240; I.V.:975] Out: 400 [Urine:400]  Labs: Recent Labs    11/14/17 0835 11/16/17 0524  HGB 12.7* 9.3*   Recent Labs    11/14/17 0835  WBC 12.9*  RBC 4.30*  HCT 37.7*  PLT 223   Recent Labs    11/15/17 0434 11/16/17 0524  NA 137 137  K 3.7 3.6  CL 108 109  CO2 26 24  BUN 20 18  CREATININE 0.95 0.94  GLUCOSE 114* 105*  CALCIUM 7.8* 7.8*   Recent Labs    11/14/17 0835  INR 1.02     EXAM General - Patient is Alert and Oriented  Abdomen-soft nontender nondistended normal bowel sounds Extremity - Sensation intact distally Dorsiflexion/Plantar flexion intact No cellulitis present Compartment soft Dressing/Incision - clean, dry, no drainage Motor Function - intact, moving foot and toes well on exam.   Past Medical History:  Diagnosis Date  . Anxiety   . Arthritis   . BPH (benign prostatic hypertrophy)   . DVT (deep vein thrombosis) in pregnancy ([place])   . Presbyacusis    right sided hearing aid    Assessment/Plan: 2 Days Post-Op Procedure(s) (LRB): INTRAMEDULLARY (IM) NAIL  INTERTROCHANTRIC (Right) Active Problems:   Hip fx ([facility])  Estimated body mass index is 28.28 kg/m as calculated from the following:   Height as of this encounter: 5\' 8"  (1.727 m).   Weight as of this encounter: 84.4 kg (186 lb). Advance diet Up with therapy D/C IV fluids Discharge to SNF when cleared by medicine. Labs and vital signs are stable Needs bowel movement.  Suppository order placed along with milk of magnesia Lovenox 40 mg subcu daily for 4 weeks. Follow-up at [facility] clinic in 2 weeks for staple removal.  DVT Prophylaxis - Lovenox Weight-Bearing as tolerated to right leg  Corey Guess, PA-C Orthopaedic Surgery 11/16/2017, 6:56 AM

## 2017-11-18 DIAGNOSIS — K5903 Drug induced constipation: Secondary | ICD-10-CM | POA: Diagnosis not present

## 2017-11-18 DIAGNOSIS — M8000XD Age-related osteoporosis with current pathological fracture, unspecified site, subsequent encounter for fracture with routine healing: Secondary | ICD-10-CM | POA: Diagnosis not present

## 2017-11-18 DIAGNOSIS — K219 Gastro-esophageal reflux disease without esophagitis: Secondary | ICD-10-CM | POA: Diagnosis not present

## 2017-11-25 ENCOUNTER — Other Ambulatory Visit: Payer: Self-pay

## 2017-11-25 ENCOUNTER — Encounter: Payer: Self-pay | Admitting: Adult Health

## 2017-11-25 ENCOUNTER — Non-Acute Institutional Stay (SKILLED_NURSING_FACILITY): Payer: Medicare Other | Admitting: Adult Health

## 2017-11-25 DIAGNOSIS — S72001S Fracture of unspecified part of neck of right femur, sequela: Secondary | ICD-10-CM

## 2017-11-25 DIAGNOSIS — K219 Gastro-esophageal reflux disease without esophagitis: Secondary | ICD-10-CM | POA: Diagnosis not present

## 2017-11-25 DIAGNOSIS — I10 Essential (primary) hypertension: Secondary | ICD-10-CM | POA: Diagnosis not present

## 2017-11-25 DIAGNOSIS — R600 Localized edema: Secondary | ICD-10-CM | POA: Diagnosis not present

## 2017-11-25 NOTE — Progress Notes (Addendum)
Location:   [facility] Room Number: 218A Place of Service:  SNF (31)    CODE STATUS: FULL  No Known Allergies  Chief Complaint  Patient presents with  . Discharge Note    Discharging on 11/26/2017    HPI:  He is being discharge with home health for pt/ot/rn. He does not need any dme; he has all necessary equipment. He will need his prescriptions written and will need to follow up with his medical provider. He had been hospitalized for a right hip fracture with nailing. He was admitted to this facility for short term rehab and is requesting to go home in the AM.    Past Medical History:  Diagnosis Date  . Anxiety   . Arthritis   . BPH (benign prostatic hypertrophy)   . DVT (deep vein thrombosis) in pregnancy ([place])   . Presbyacusis    right sided hearing aid    Past Surgical History:  Procedure Laterality Date  . EYE SURGERY     cataract surgery  . HERNIA REPAIR    . INTRAMEDULLARY (IM) NAIL INTERTROCHANTERIC Right 11/14/2017   Procedure: INTRAMEDULLARY (IM) NAIL INTERTROCHANTRIC;  Surgeon: Leim Fabry, MD;  Location: [facility] ORS;  Service: Orthopedics;  Laterality: Right;  . JOINT REPLACEMENT  1953   ankle crush injury, right  . PROSTATE SURGERY     biopsy   . TONSILLECTOMY AND ADENOIDECTOMY  1936  . TOTAL HIP ARTHROPLASTY  2007   left , Dr Cay Schillings    Social History   Socioeconomic History  . Marital status: Married    Spouse name: Not on file  . Number of children: Not on file  . Years of education: Not on file  . Highest education level: Not on file  Occupational History  . Not on file  Social Needs  . Financial resource strain: Not on file  . Food insecurity:    Worry: Not on file    Inability: Not on file  . Transportation needs:    Medical: Not on file    Non-medical: Not on file  Tobacco Use  . Smoking status: Former Smoker    Last attempt to quit: 04/16/1954    Years since quitting: 63.6  . Smokeless tobacco: Never Used   Substance and Sexual Activity  . Alcohol use: No  . Drug use: No  . Sexual activity: Not on file  Lifestyle  . Physical activity:    Days per week: Not on file    Minutes per session: Not on file  . Stress: Not on file  Relationships  . Social connections:    Talks on phone: Not on file    Gets together: Not on file    Attends religious service: Not on file    Active member of club or organization: Not on file    Attends meetings of clubs or organizations: Not on file    Relationship status: Not on file  . Intimate partner violence:    Fear of current or ex partner: Not on file    Emotionally abused: Not on file    Physically abused: Not on file    Forced sexual activity: Not on file  Other Topics Concern  . Not on file  Social History Narrative  . Not on file   Family History  Problem Relation Age of Onset  . Arthritis Mother   . Kidney cancer Neg Hx   . Kidney disease Neg Hx   . Prostate cancer Neg  Hx     VITAL SIGNS BP 139/71   Pulse 85   Temp 98.1 F (36.7 C) (Oral)   Resp 20   Ht 5\' 8"  (1.727 m)   Wt 180 lb 6.4 oz (81.8 kg)   SpO2 96%   BMI 27.43 kg/m   Patient's Medications  New Prescriptions   No medications on file  Previous Medications   CHOLECALCIFEROL 4000 UNITS CAPS    Take 1 capsule by mouth daily.   DOXAZOSIN (CARDURA) 2 MG TABLET    TAKE 1 TABLET BY MOUTH AT  BEDTIME   ENOXAPARIN (LOVENOX) 40 MG/0.4ML INJECTION    Inject 0.4 mLs (40 mg total) into the skin daily.   FINASTERIDE (PROSCAR) 5 MG TABLET    TAKE 1 TABLET BY MOUTH  DAILY   MULTIPLE VITAMINS-MINERALS (CEROVITE PO)    Take 1 tablet by mouth daily.   ONDANSETRON (ZOFRAN) 4 MG TABLET    Take 1 tablet (4 mg total) by mouth every 6 (six) hours as needed for nausea.   OXYCODONE (OXY IR/ROXICODONE) 5 MG IMMEDIATE RELEASE TABLET    Take 1-2 tablets (5-10 mg total) by mouth every 4 (four) hours as needed for severe pain (pain score 7-10).   POLYETHYLENE GLYCOL (MIRALAX / GLYCOLAX) PACKET     Take 17 g by mouth daily.   RANITIDINE (ZANTAC) 75 MG TABLET    Take 75 mg by mouth 2 (two) times daily as needed for heartburn.    SENNA (SENOKOT) 8.6 MG TABS TABLET    Take 2 tablets by mouth 2 (two) times daily.   TRAMADOL (ULTRAM) 50 MG TABLET    Take 1 tablet (50 mg total) by mouth every 6 (six) hours as needed for moderate pain.   UNABLE TO FIND    Diet Type: Regular  Modified Medications   No medications on file  Discontinued Medications   MULTIPLE VITAMIN (MULTIVITAMIN) TABLET    Take 1 tablet by mouth daily.     SIGNIFICANT DIAGNOSTIC EXAMS  LABS REVIEWED: TODAY:   11-14-17: wbc 12.9; hgb 12.7; hct 37.7; mcv 87.7; plt 223; glucose 106; bun 23; creat 1.20; k+ 3.6; na++ 137; ca 8.4 11-16-17: glucose 105; bun 18; creat 0.94; k+ 3.6; na++ 137; ca 7.8; liver normal albumin 2.7   Review of Systems  Constitutional: Negative for malaise/fatigue.  Respiratory: Negative for cough and shortness of breath.   Cardiovascular: Negative for chest pain, palpitations and leg swelling.  Gastrointestinal: Negative for abdominal pain, constipation and heartburn.  Musculoskeletal: Negative for back pain, joint pain and myalgias.  Skin: Negative.   Neurological: Negative for dizziness.  Psychiatric/Behavioral: The patient is not nervous/anxious.     Physical Exam  Constitutional: He is oriented to person, place, and time. He appears well-developed and well-nourished. No distress.  Neck: No thyromegaly present.  Cardiovascular: Normal rate, regular rhythm, normal heart sounds and intact distal pulses.  Pulmonary/Chest: Effort normal and breath sounds normal. No respiratory distress.  Abdominal: Soft. Bowel sounds are normal. He exhibits no distension. There is no tenderness.  Musculoskeletal: He exhibits edema.  Is able to move all extremities Is status post right hip fracture.  1+right lower extremity edema  Lymphadenopathy:    He has no cervical adenopathy.  Neurological: He is alert and  oriented to person, place, and time.  Skin: Skin is warm and dry. He is not diaphoretic.  Right hip incision line without signs of infection present.   Psychiatric: He has a normal mood and affect.  ASSESSMENT/ PLAN:  Patient is being discharged with the following home health services:  Pt/ot/rn: to evaluate and treat as indicated for gait balance strength adl training and medication management.   Patient is being discharged with the following durable medical equipment:  None needed  Patient has been advised to f/u with their PCP in 1-2 weeks to bring them up to date on their rehab stay.  Social services at facility was responsible for arranging this appointment.  Pt was provided with a 30 day supply of prescriptions for medications and refills must be obtained from their PCP.  For controlled substances, a more limited supply may be provided adequate until PCP appointment only.   A 30 day supply of his prescription medications have been written as listed above #20 oxycodone 5 mg tabs #20 ultram 50 mg tabs    Time spent with patient: 35 minutes; discussed home health needs and expectations; does not need dme; and medications have verbalized understanding.   Ok Edwards NP [facility]  Contact [phone] Monday through Friday 8am- 5pm  After hours call [phone]

## 2017-11-25 NOTE — Telephone Encounter (Signed)
Patient will receive medication refill upon discharge 11/26/2017 for Tramadol 50 mg (1 tab po q6h prn 20 tabs total) and Oxycodone 5 mg tabs (1 tab po q4h prn 20 tabs total).

## 2017-11-27 DIAGNOSIS — M80051D Age-related osteoporosis with current pathological fracture, right femur, subsequent encounter for fracture with routine healing: Secondary | ICD-10-CM | POA: Diagnosis not present

## 2017-11-27 DIAGNOSIS — Z9181 History of falling: Secondary | ICD-10-CM | POA: Diagnosis not present

## 2017-11-27 DIAGNOSIS — H9111 Presbycusis, right ear: Secondary | ICD-10-CM | POA: Diagnosis not present

## 2017-11-27 DIAGNOSIS — I10 Essential (primary) hypertension: Secondary | ICD-10-CM | POA: Diagnosis not present

## 2017-11-27 DIAGNOSIS — K219 Gastro-esophageal reflux disease without esophagitis: Secondary | ICD-10-CM | POA: Diagnosis not present

## 2017-11-27 DIAGNOSIS — Z7901 Long term (current) use of anticoagulants: Secondary | ICD-10-CM | POA: Diagnosis not present

## 2017-12-03 DIAGNOSIS — M80051D Age-related osteoporosis with current pathological fracture, right femur, subsequent encounter for fracture with routine healing: Secondary | ICD-10-CM | POA: Diagnosis not present

## 2017-12-03 DIAGNOSIS — I10 Essential (primary) hypertension: Secondary | ICD-10-CM | POA: Diagnosis not present

## 2017-12-03 DIAGNOSIS — K219 Gastro-esophageal reflux disease without esophagitis: Secondary | ICD-10-CM | POA: Diagnosis not present

## 2017-12-03 DIAGNOSIS — H9111 Presbycusis, right ear: Secondary | ICD-10-CM | POA: Diagnosis not present

## 2017-12-03 DIAGNOSIS — Z7901 Long term (current) use of anticoagulants: Secondary | ICD-10-CM | POA: Diagnosis not present

## 2017-12-03 DIAGNOSIS — Z9181 History of falling: Secondary | ICD-10-CM | POA: Diagnosis not present

## 2017-12-04 DIAGNOSIS — Z9181 History of falling: Secondary | ICD-10-CM | POA: Diagnosis not present

## 2017-12-04 DIAGNOSIS — Z7901 Long term (current) use of anticoagulants: Secondary | ICD-10-CM | POA: Diagnosis not present

## 2017-12-04 DIAGNOSIS — H9111 Presbycusis, right ear: Secondary | ICD-10-CM | POA: Diagnosis not present

## 2017-12-04 DIAGNOSIS — M80051D Age-related osteoporosis with current pathological fracture, right femur, subsequent encounter for fracture with routine healing: Secondary | ICD-10-CM | POA: Diagnosis not present

## 2017-12-04 DIAGNOSIS — K219 Gastro-esophageal reflux disease without esophagitis: Secondary | ICD-10-CM | POA: Diagnosis not present

## 2017-12-04 DIAGNOSIS — I10 Essential (primary) hypertension: Secondary | ICD-10-CM | POA: Diagnosis not present

## 2017-12-05 DIAGNOSIS — H9111 Presbycusis, right ear: Secondary | ICD-10-CM | POA: Diagnosis not present

## 2017-12-05 DIAGNOSIS — I10 Essential (primary) hypertension: Secondary | ICD-10-CM | POA: Diagnosis not present

## 2017-12-05 DIAGNOSIS — Z9181 History of falling: Secondary | ICD-10-CM | POA: Diagnosis not present

## 2017-12-05 DIAGNOSIS — K219 Gastro-esophageal reflux disease without esophagitis: Secondary | ICD-10-CM | POA: Diagnosis not present

## 2017-12-05 DIAGNOSIS — Z7901 Long term (current) use of anticoagulants: Secondary | ICD-10-CM | POA: Diagnosis not present

## 2017-12-05 DIAGNOSIS — M80051D Age-related osteoporosis with current pathological fracture, right femur, subsequent encounter for fracture with routine healing: Secondary | ICD-10-CM | POA: Diagnosis not present

## 2017-12-06 DIAGNOSIS — H9111 Presbycusis, right ear: Secondary | ICD-10-CM | POA: Diagnosis not present

## 2017-12-06 DIAGNOSIS — Z9181 History of falling: Secondary | ICD-10-CM | POA: Diagnosis not present

## 2017-12-06 DIAGNOSIS — I10 Essential (primary) hypertension: Secondary | ICD-10-CM | POA: Diagnosis not present

## 2017-12-06 DIAGNOSIS — K219 Gastro-esophageal reflux disease without esophagitis: Secondary | ICD-10-CM | POA: Diagnosis not present

## 2017-12-06 DIAGNOSIS — Z7901 Long term (current) use of anticoagulants: Secondary | ICD-10-CM | POA: Diagnosis not present

## 2017-12-06 DIAGNOSIS — M80051D Age-related osteoporosis with current pathological fracture, right femur, subsequent encounter for fracture with routine healing: Secondary | ICD-10-CM | POA: Diagnosis not present

## 2017-12-09 DIAGNOSIS — Z9181 History of falling: Secondary | ICD-10-CM | POA: Diagnosis not present

## 2017-12-09 DIAGNOSIS — Z7901 Long term (current) use of anticoagulants: Secondary | ICD-10-CM | POA: Diagnosis not present

## 2017-12-09 DIAGNOSIS — K219 Gastro-esophageal reflux disease without esophagitis: Secondary | ICD-10-CM | POA: Diagnosis not present

## 2017-12-09 DIAGNOSIS — M80051D Age-related osteoporosis with current pathological fracture, right femur, subsequent encounter for fracture with routine healing: Secondary | ICD-10-CM | POA: Diagnosis not present

## 2017-12-09 DIAGNOSIS — I10 Essential (primary) hypertension: Secondary | ICD-10-CM | POA: Diagnosis not present

## 2017-12-09 DIAGNOSIS — H9111 Presbycusis, right ear: Secondary | ICD-10-CM | POA: Diagnosis not present

## 2017-12-11 DIAGNOSIS — M25551 Pain in right hip: Secondary | ICD-10-CM | POA: Diagnosis not present

## 2017-12-12 DIAGNOSIS — Z9181 History of falling: Secondary | ICD-10-CM | POA: Diagnosis not present

## 2017-12-12 DIAGNOSIS — Z7901 Long term (current) use of anticoagulants: Secondary | ICD-10-CM | POA: Diagnosis not present

## 2017-12-12 DIAGNOSIS — I10 Essential (primary) hypertension: Secondary | ICD-10-CM | POA: Diagnosis not present

## 2017-12-12 DIAGNOSIS — M80051D Age-related osteoporosis with current pathological fracture, right femur, subsequent encounter for fracture with routine healing: Secondary | ICD-10-CM | POA: Diagnosis not present

## 2017-12-12 DIAGNOSIS — K219 Gastro-esophageal reflux disease without esophagitis: Secondary | ICD-10-CM | POA: Diagnosis not present

## 2017-12-12 DIAGNOSIS — H9111 Presbycusis, right ear: Secondary | ICD-10-CM | POA: Diagnosis not present

## 2017-12-13 ENCOUNTER — Encounter: Payer: Self-pay | Admitting: Internal Medicine

## 2017-12-13 ENCOUNTER — Ambulatory Visit (INDEPENDENT_AMBULATORY_CARE_PROVIDER_SITE_OTHER): Payer: Medicare Other | Admitting: Internal Medicine

## 2017-12-13 VITALS — BP 122/70 | HR 78 | Temp 98.5°F | Resp 15 | Ht 68.0 in | Wt 172.1 lb

## 2017-12-13 DIAGNOSIS — Z23 Encounter for immunization: Secondary | ICD-10-CM | POA: Diagnosis not present

## 2017-12-13 DIAGNOSIS — D62 Acute posthemorrhagic anemia: Secondary | ICD-10-CM

## 2017-12-13 DIAGNOSIS — Z09 Encounter for follow-up examination after completed treatment for conditions other than malignant neoplasm: Secondary | ICD-10-CM

## 2017-12-13 DIAGNOSIS — S72001S Fracture of unspecified part of neck of right femur, sequela: Secondary | ICD-10-CM

## 2017-12-13 DIAGNOSIS — H9111 Presbycusis, right ear: Secondary | ICD-10-CM | POA: Diagnosis not present

## 2017-12-13 DIAGNOSIS — M7989 Other specified soft tissue disorders: Secondary | ICD-10-CM

## 2017-12-13 DIAGNOSIS — R748 Abnormal levels of other serum enzymes: Secondary | ICD-10-CM

## 2017-12-13 DIAGNOSIS — I10 Essential (primary) hypertension: Secondary | ICD-10-CM | POA: Diagnosis not present

## 2017-12-13 DIAGNOSIS — R600 Localized edema: Secondary | ICD-10-CM

## 2017-12-13 DIAGNOSIS — K5900 Constipation, unspecified: Secondary | ICD-10-CM

## 2017-12-13 DIAGNOSIS — Z7901 Long term (current) use of anticoagulants: Secondary | ICD-10-CM | POA: Diagnosis not present

## 2017-12-13 DIAGNOSIS — Z9181 History of falling: Secondary | ICD-10-CM | POA: Diagnosis not present

## 2017-12-13 DIAGNOSIS — M80051D Age-related osteoporosis with current pathological fracture, right femur, subsequent encounter for fracture with routine healing: Secondary | ICD-10-CM | POA: Diagnosis not present

## 2017-12-13 DIAGNOSIS — K219 Gastro-esophageal reflux disease without esophagitis: Secondary | ICD-10-CM | POA: Diagnosis not present

## 2017-12-13 DIAGNOSIS — D509 Iron deficiency anemia, unspecified: Secondary | ICD-10-CM

## 2017-12-13 LAB — COMPREHENSIVE METABOLIC PANEL
ALT: 20 U/L (ref 0–53)
AST: 20 U/L (ref 0–37)
Albumin: 3.9 g/dL (ref 3.5–5.2)
Alkaline Phosphatase: 137 U/L — ABNORMAL HIGH (ref 39–117)
BUN: 20 mg/dL (ref 6–23)
CALCIUM: 9.2 mg/dL (ref 8.4–10.5)
CO2: 24 meq/L (ref 19–32)
Chloride: 102 mEq/L (ref 96–112)
Creatinine, Ser: 1.1 mg/dL (ref 0.40–1.50)
GFR: 67.06 mL/min (ref >60.00)
GLUCOSE: 139 mg/dL — AB (ref 70–99)
POTASSIUM: 3.7 meq/L (ref 3.5–5.1)
Sodium: 136 mEq/L (ref 135–145)
Total Bilirubin: 1.3 mg/dL — ABNORMAL HIGH (ref 0.2–1.2)
Total Protein: 7.5 g/dL (ref 6.0–8.3)

## 2017-12-13 LAB — CBC WITH DIFFERENTIAL/PLATELET
Basophils Absolute: 0.1 10*3/uL (ref 0.0–0.1)
Basophils Relative: 0.7 % (ref 0.0–3.0)
Eosinophils Absolute: 0.3 10*3/uL (ref 0.0–0.7)
Eosinophils Relative: 2.9 % (ref 0.0–5.0)
HCT: 37.5 % — ABNORMAL LOW (ref 39.0–52.0)
Hemoglobin: 12.2 g/dL — ABNORMAL LOW (ref 13.0–17.0)
LYMPHS ABS: 3.2 10*3/uL (ref 0.7–4.0)
Lymphocytes Relative: 28.5 % (ref 12.0–46.0)
MCHC: 32.5 g/dL (ref 30.0–36.0)
MCV: 88.9 fl (ref 78.0–100.0)
MONO ABS: 0.9 10*3/uL (ref 0.1–1.0)
MONOS PCT: 7.7 % (ref 3.0–12.0)
NEUTROS ABS: 6.7 10*3/uL (ref 1.4–7.7)
NEUTROS PCT: 60.2 % (ref 43.0–77.0)
PLATELETS: 308 10*3/uL (ref 150.0–400.0)
RBC: 4.22 Mil/uL (ref 4.22–5.81)
RDW: 15.3 % (ref 11.5–15.5)
WBC: 11.2 10*3/uL — ABNORMAL HIGH (ref 4.0–10.5)

## 2017-12-13 LAB — MAGNESIUM: MAGNESIUM: 2.1 mg/dL (ref 1.5–2.5)

## 2017-12-13 NOTE — Patient Instructions (Addendum)
I have sent a prescription for a cathartic laxative called lactulose to your pharmacy,  It is in liquid form and can be used every 6 hours to releive severe constipation.  It is better tolerated than citrate of magnesium.  You should use it sparingly (isolated occasions,  Not more than every 3 days ) and take a daily  Dose of bulk forming  fiber laxative such as metamucil and miralax  Along with 200 mg of generic Colace (docusate)    Please call us when you get home and let us know [place]

## 2017-12-13 NOTE — Progress Notes (Signed)
Subjective:  Patient ID: Corey Todd, male    DOB: 12/22/1927  Age: 84 y.o. MRN: [id number]  CC: The primary encounter diagnosis was Anemia due to acute blood loss. Diagnoses of Constipation, unspecified constipation type, Hypocalcemia, Need for influenza vaccination, Right leg swelling, Closed right hip fracture, sequela, Bilateral lower extremity edema, Iron deficiency anemia, unspecified iron deficiency anemia type, Elevated liver enzymes, and Hospital discharge follow-up were also pertinent to this visit.  HPI KAICEN DESENA presents for HOSPITAL FOLLOW UP  Patient was admitted to [facility] on August 1  with closed fracture of right hip and underwent  cephalomedullary nailing.  Per surgery post op noted,  He was supposed to be Receiving Lovenox  For a total of 4 weeks for DVT prophylaxis, but patient states today that he has not had any injections since he left [facility] on August 12.  Marland Kitchen ALSO NEVER RECEIVED Any  COMPRESSION  STOCKING for THE  right leg  He was referred for home home health nursing and  PT at discharge from [facility] , but has only had one PT evaluation (not sure of the agency ,  He thinks it may be [facility]  ?)  Walking with a walker at home  but in a wheelchair today .  Denies pain .  Has not been using narcotics.    Constipation .  Acute on chronic.  Has tried stool softeners and metamucil,  Only small bM;s  Tried ex lax yesterday  Post operative anemia  Hg drop from 12.7 to 9.3  ,caLCIUMm was low as well but alb was 2.7  Outpatient Medications Prior to Visit  Medication Sig Dispense Refill  . doxazosin (CARDURA) 2 MG tablet TAKE 1 TABLET BY MOUTH AT  BEDTIME 90 tablet 1  . finasteride (PROSCAR) 5 MG tablet TAKE 1 TABLET BY MOUTH  DAILY 90 tablet 1  . Multiple Vitamins-Minerals (CEROVITE PO) Take 1 tablet by mouth daily.    . polyethylene glycol (MIRALAX / GLYCOLAX) packet Take 17 g by mouth daily. 14 each 0  . ranitidine (ZANTAC) 75 MG tablet Take 75 mg by mouth 2 (two)  times daily as needed for heartburn.     . senna (SENOKOT) 8.6 MG TABS tablet Take 2 tablets by mouth 2 (two) times daily.    Marland Kitchen UNABLE TO FIND Diet Type: Regular    . Cholecalciferol 4000 units CAPS Take 1 capsule by mouth daily.    Marland Kitchen enoxaparin (LOVENOX) 40 MG/0.4ML injection Inject 0.4 mLs (40 mg total) into the skin daily. (Patient not taking: Reported on 12/13/2017) 14 Syringe 0  . ondansetron (ZOFRAN) 4 MG tablet Take 1 tablet (4 mg total) by mouth every 6 (six) hours as needed for nausea. (Patient not taking: Reported on 12/13/2017) 20 tablet 0  . oxyCODONE (OXY IR/ROXICODONE) 5 MG immediate release tablet Take 1-2 tablets (5-10 mg total) by mouth every 4 (four) hours as needed for severe pain (pain score 7-10). (Patient not taking: Reported on 12/13/2017) 30 tablet 0  . traMADol (ULTRAM) 50 MG tablet Take 1 tablet (50 mg total) by mouth every 6 (six) hours as needed for moderate pain. (Patient not taking: Reported on 12/13/2017) 30 tablet 1   No facility-administered medications prior to visit.     Review of Systems;  Patient denies headache, fevers, malaise, unintentional weight loss, skin rash, eye pain, sinus congestion and sinus pain, sore throat, dysphagia,  hemoptysis , cough, dyspnea, wheezing, chest pain, palpitations, orthopnea, edema, abdominal pain, nausea, melena, diarrhea,  constipation, flank pain, dysuria, hematuria, urinary  Frequency, nocturia, numbness, tingling, seizures,  Focal weakness, Loss of consciousness,  Tremor, insomnia, depression, anxiety, and suicidal ideation.      Objective:  BP 122/70 (BP Location: Left Arm, Patient Position: Sitting, Cuff Size: Normal)   Pulse 78   Temp 98.5 F (36.9 C) (Oral)   Resp 15   Ht 5\' 8"  (1.727 m)   Wt 172 lb 1.9 oz (78.1 kg)   SpO2 94%   BMI 26.17 kg/m   BP Readings from Last 3 Encounters:  12/13/17 122/70  11/25/17 139/71  11/15/17 (!) 136/59    Wt Readings from Last 3 Encounters:  12/13/17 172 lb 1.9 oz (78.1 kg)    11/25/17 180 lb 6.4 oz (81.8 kg)  11/14/17 186 lb (84.4 kg)    General appearance: alert, cooperative and appears stated age Ears: normal TM's and external ear canals both ears Throat: lips, mucosa, and tongue normal; teeth and gums normal Neck: no adenopathy, no carotid bruit, supple, symmetrical, trachea midline and thyroid not enlarged, symmetric, no tenderness/mass/nodules Back: symmetric, no curvature. ROM normal. No CVA tenderness. Lungs: clear to auscultation bilaterally Heart: regular rate and rhythm, S1, S2 normal, no murmur, click, rub or gallop Abdomen: soft, non-tender; bowel sounds normal; no masses,  no organomegaly Pulses: 2+ and symmetric Skin: Skin color, texture, turgor normal. No rashes or lesions Lymph nodes: Cervical, supraclavicular, and axillary nodes normal.  Lab Results  Component Value Date   HGBA1C 5.6 10/16/2017   HGBA1C 5.4 12/26/2015    Lab Results  Component Value Date   CREATININE 1.10 12/13/2017   CREATININE 0.94 11/16/2017   CREATININE 0.95 11/15/2017    Lab Results  Component Value Date   WBC 11.2 (H) 12/13/2017   HGB 12.2 (L) 12/13/2017   HCT 37.5 (L) 12/13/2017   PLT 308.0 12/13/2017   GLUCOSE 139 (H) 12/13/2017   CHOL 146 11/21/2012   TRIG 108.0 11/21/2012   HDL 48.80 11/21/2012   LDLCALC 76 11/21/2012   ALT 20 12/13/2017   AST 20 12/13/2017   NA 136 12/13/2017   K 3.7 12/13/2017   CL 102 12/13/2017   CREATININE 1.10 12/13/2017   BUN 20 12/13/2017   CO2 24 12/13/2017   TSH 4.21 10/16/2017   INR 1.02 11/14/2017   HGBA1C 5.6 10/16/2017   MICROALBUR <0.7 06/26/2016    No results found.  Assessment & Plan:   Problem List Items Addressed This Visit    Bilateral lower extremity edema    He was NOT given compression stockings or knee highs during rehab ,  Although he was told several times by staff that they were ordered      Closed right hip fracture, sequela    S/p cephalomedullary nailing on August 2,  Followed by  rehab at [facility] until [place] 12.  He is ambulated readily using a walker and denies pain..  DVT propyholaxis was OT continued for one month as planned by Surgery, for  unclear reasons, and he has not had any for over 2 weeks .       Relevant Orders   Ambulatory referral to [facility] discharge follow-up    Patient is stable post discharge and has no new issues or questions about discharge plans at the visit today for hospital follow up. All labs , imaging studies and progress notes from admission were reviewed with patient today        Iron deficiency anemia    Secondary  to post operative blood loss.  Repeat CBC shows near resolution,  But iron stores are low.        Relevant Medications   ferrous fumarate-iron polysaccharide complex (TANDEM) 162-115.2 MG CAPS capsule   Right leg swelling    Venous insufficiency aggravated by recent hip fracture.  No signs of DVT on exam .  Compression stocking advised       Relevant Orders   DME Other see comment    Other Visit Diagnoses    Anemia due to acute blood loss    -  Primary   Relevant Medications   ferrous fumarate-iron polysaccharide complex (TANDEM) 162-115.2 MG CAPS capsule   Other Relevant Orders   CBC with Differential/Platelet (Completed)   Iron, TIBC and Ferritin Panel (Completed)   Iron, TIBC and Ferritin Panel   CBC with Differential/Platelet   Constipation, unspecified constipation type       Relevant Orders   Magnesium (Completed)   Comprehensive metabolic panel (Completed)   Hypocalcemia       Relevant Orders   Calcium, ionized (Completed)   Need for influenza vaccination       Relevant Orders   Flu vaccine HIGH DOSE PF (Fluzone High dose) (Completed)   Elevated liver enzymes       Relevant Orders   Comprehensive metabolic panel     A total of 40 minutes was spent with patient more than half of which was spent in counseling patient on the above mentioned issues , reviewing and explaining recent labs  and imaging studies done, and coordination of care.  I have discontinued Marca Ancona. Castrillon's traMADol, oxyCODONE, enoxaparin, ondansetron, and Cholecalciferol. I am also having him start on ferrous fumarate-iron polysaccharide complex. Additionally, I am having him maintain his ranitidine, doxazosin, finasteride, polyethylene glycol, Multiple Vitamins-Minerals (CEROVITE PO), senna, and UNABLE TO FIND.  Meds ordered this encounter  Medications  . ferrous fumarate-iron polysaccharide complex (TANDEM) 162-115.2 MG CAPS capsule    Sig: Take 1 capsule by mouth daily with breakfast.    Dispense:  30 capsule    Refill:  1    May substitute any available comparable iron supplement    Medications Discontinued During This Encounter  Medication Reason  . Cholecalciferol 4000 units CAPS Patient has not taken in last 30 days  . enoxaparin (LOVENOX) 40 MG/0.4ML injection Patient has not taken in last 30 days  . ondansetron (ZOFRAN) 4 MG tablet Patient has not taken in last 30 days  . oxyCODONE (OXY IR/ROXICODONE) 5 MG immediate release tablet Completed Course  . traMADol (ULTRAM) 50 MG tablet Completed Course    Follow-up: No follow-ups on file.   Crecencio Mc, MD

## 2017-12-14 LAB — CALCIUM, IONIZED: Calcium, Ion: 5.1 mg/dL (ref 4.8–5.6)

## 2017-12-14 LAB — IRON,TIBC AND FERRITIN PANEL
%SAT: 18 % (calc) — ABNORMAL LOW (ref 20–48)
FERRITIN: 110 ng/mL (ref 24–380)
IRON: 48 ug/dL — AB (ref 50–180)
TIBC: 270 ug/dL (ref 250–425)

## 2017-12-15 DIAGNOSIS — D509 Iron deficiency anemia, unspecified: Secondary | ICD-10-CM | POA: Insufficient documentation

## 2017-12-15 DIAGNOSIS — Z09 Encounter for follow-up examination after completed treatment for conditions other than malignant neoplasm: Secondary | ICD-10-CM | POA: Insufficient documentation

## 2017-12-15 MED ORDER — FERROUS FUM-IRON POLYSACCH 162-115.2 MG PO CAPS: 1.0000 | ORAL_CAPSULE | Freq: Every day | ORAL | 1 refills | Status: DC

## 2017-12-15 NOTE — Assessment & Plan Note (Signed)
Venous insufficiency aggravated by recent hip fracture.  No signs of DVT on exam .  Compression stocking advised

## 2017-12-15 NOTE — Assessment & Plan Note (Signed)
S/p cephalomedullary nailing on August 2,  Followed by rehab at [facility] until [place] 12.  He is ambulated readily using a walker and denies pain..  DVT propyholaxis was OT continued for one month as planned by Surgery, for  unclear reasons, and he has not had any for over 2 weeks .

## 2017-12-15 NOTE — Assessment & Plan Note (Signed)
Patient is stable post discharge and has no new issues or questions about discharge plans at the visit today for hospital follow up. All labs , imaging studies and progress notes from admission were reviewed with patient today   

## 2017-12-15 NOTE — Assessment & Plan Note (Signed)
Secondary to post operative blood loss.  Repeat CBC shows near resolution,  But iron stores are low.

## 2017-12-15 NOTE — Assessment & Plan Note (Signed)
He was NOT given compression stockings or knee highs during rehab ,  Although he was told several times by staff that they were ordered

## 2017-12-17 DIAGNOSIS — Z7901 Long term (current) use of anticoagulants: Secondary | ICD-10-CM | POA: Diagnosis not present

## 2017-12-17 DIAGNOSIS — H9111 Presbycusis, right ear: Secondary | ICD-10-CM | POA: Diagnosis not present

## 2017-12-17 DIAGNOSIS — Z9181 History of falling: Secondary | ICD-10-CM | POA: Diagnosis not present

## 2017-12-17 DIAGNOSIS — M80051D Age-related osteoporosis with current pathological fracture, right femur, subsequent encounter for fracture with routine healing: Secondary | ICD-10-CM | POA: Diagnosis not present

## 2017-12-17 DIAGNOSIS — I10 Essential (primary) hypertension: Secondary | ICD-10-CM | POA: Diagnosis not present

## 2017-12-17 DIAGNOSIS — K219 Gastro-esophageal reflux disease without esophagitis: Secondary | ICD-10-CM | POA: Diagnosis not present

## 2017-12-18 ENCOUNTER — Telehealth: Payer: Self-pay

## 2017-12-18 NOTE — Telephone Encounter (Signed)
Spoke with pt's wife in regards to the DME order for the compression stockings. The wife stated that they are homebound and aren't able to come pick up the order. She stated that the nurse is coming out tomorrow from [facility] and she is going to ask her about getting some compression stockings. Wife was also advised of pt's lab results since they do not receive mychart messages any longer, their computer is down right now. Scheduled a non fasting lab appt in one month. Wife is aware of appt date and time.

## 2017-12-19 DIAGNOSIS — M80051D Age-related osteoporosis with current pathological fracture, right femur, subsequent encounter for fracture with routine healing: Secondary | ICD-10-CM | POA: Diagnosis not present

## 2017-12-19 DIAGNOSIS — Z7901 Long term (current) use of anticoagulants: Secondary | ICD-10-CM | POA: Diagnosis not present

## 2017-12-19 DIAGNOSIS — Z9181 History of falling: Secondary | ICD-10-CM | POA: Diagnosis not present

## 2017-12-19 DIAGNOSIS — I10 Essential (primary) hypertension: Secondary | ICD-10-CM | POA: Diagnosis not present

## 2017-12-19 DIAGNOSIS — H9111 Presbycusis, right ear: Secondary | ICD-10-CM | POA: Diagnosis not present

## 2017-12-19 DIAGNOSIS — K219 Gastro-esophageal reflux disease without esophagitis: Secondary | ICD-10-CM | POA: Diagnosis not present

## 2017-12-20 DIAGNOSIS — I10 Essential (primary) hypertension: Secondary | ICD-10-CM | POA: Diagnosis not present

## 2017-12-20 DIAGNOSIS — Z7901 Long term (current) use of anticoagulants: Secondary | ICD-10-CM | POA: Diagnosis not present

## 2017-12-20 DIAGNOSIS — Z9181 History of falling: Secondary | ICD-10-CM | POA: Diagnosis not present

## 2017-12-20 DIAGNOSIS — K219 Gastro-esophageal reflux disease without esophagitis: Secondary | ICD-10-CM | POA: Diagnosis not present

## 2017-12-20 DIAGNOSIS — H9111 Presbycusis, right ear: Secondary | ICD-10-CM | POA: Diagnosis not present

## 2017-12-20 DIAGNOSIS — M80051D Age-related osteoporosis with current pathological fracture, right femur, subsequent encounter for fracture with routine healing: Secondary | ICD-10-CM | POA: Diagnosis not present

## 2017-12-24 DIAGNOSIS — Z9181 History of falling: Secondary | ICD-10-CM | POA: Diagnosis not present

## 2017-12-24 DIAGNOSIS — H9111 Presbycusis, right ear: Secondary | ICD-10-CM | POA: Diagnosis not present

## 2017-12-24 DIAGNOSIS — Z7901 Long term (current) use of anticoagulants: Secondary | ICD-10-CM | POA: Diagnosis not present

## 2017-12-24 DIAGNOSIS — I10 Essential (primary) hypertension: Secondary | ICD-10-CM | POA: Diagnosis not present

## 2017-12-24 DIAGNOSIS — M80051D Age-related osteoporosis with current pathological fracture, right femur, subsequent encounter for fracture with routine healing: Secondary | ICD-10-CM | POA: Diagnosis not present

## 2017-12-24 DIAGNOSIS — K219 Gastro-esophageal reflux disease without esophagitis: Secondary | ICD-10-CM | POA: Diagnosis not present

## 2017-12-26 DIAGNOSIS — M80051D Age-related osteoporosis with current pathological fracture, right femur, subsequent encounter for fracture with routine healing: Secondary | ICD-10-CM | POA: Diagnosis not present

## 2017-12-26 DIAGNOSIS — Z7901 Long term (current) use of anticoagulants: Secondary | ICD-10-CM | POA: Diagnosis not present

## 2017-12-26 DIAGNOSIS — H9111 Presbycusis, right ear: Secondary | ICD-10-CM | POA: Diagnosis not present

## 2017-12-26 DIAGNOSIS — Z9181 History of falling: Secondary | ICD-10-CM | POA: Diagnosis not present

## 2017-12-26 DIAGNOSIS — K219 Gastro-esophageal reflux disease without esophagitis: Secondary | ICD-10-CM | POA: Diagnosis not present

## 2017-12-26 DIAGNOSIS — I10 Essential (primary) hypertension: Secondary | ICD-10-CM | POA: Diagnosis not present

## 2017-12-27 DIAGNOSIS — H9111 Presbycusis, right ear: Secondary | ICD-10-CM | POA: Diagnosis not present

## 2017-12-27 DIAGNOSIS — Z9181 History of falling: Secondary | ICD-10-CM | POA: Diagnosis not present

## 2017-12-27 DIAGNOSIS — I10 Essential (primary) hypertension: Secondary | ICD-10-CM | POA: Diagnosis not present

## 2017-12-27 DIAGNOSIS — K219 Gastro-esophageal reflux disease without esophagitis: Secondary | ICD-10-CM | POA: Diagnosis not present

## 2017-12-27 DIAGNOSIS — M80051D Age-related osteoporosis with current pathological fracture, right femur, subsequent encounter for fracture with routine healing: Secondary | ICD-10-CM | POA: Diagnosis not present

## 2017-12-27 DIAGNOSIS — Z7901 Long term (current) use of anticoagulants: Secondary | ICD-10-CM | POA: Diagnosis not present

## 2017-12-31 DIAGNOSIS — H9111 Presbycusis, right ear: Secondary | ICD-10-CM | POA: Diagnosis not present

## 2017-12-31 DIAGNOSIS — K219 Gastro-esophageal reflux disease without esophagitis: Secondary | ICD-10-CM | POA: Diagnosis not present

## 2017-12-31 DIAGNOSIS — I10 Essential (primary) hypertension: Secondary | ICD-10-CM | POA: Diagnosis not present

## 2017-12-31 DIAGNOSIS — M80051D Age-related osteoporosis with current pathological fracture, right femur, subsequent encounter for fracture with routine healing: Secondary | ICD-10-CM | POA: Diagnosis not present

## 2017-12-31 DIAGNOSIS — Z9181 History of falling: Secondary | ICD-10-CM | POA: Diagnosis not present

## 2017-12-31 DIAGNOSIS — Z7901 Long term (current) use of anticoagulants: Secondary | ICD-10-CM | POA: Diagnosis not present

## 2018-01-02 DIAGNOSIS — I10 Essential (primary) hypertension: Secondary | ICD-10-CM | POA: Diagnosis not present

## 2018-01-02 DIAGNOSIS — H9111 Presbycusis, right ear: Secondary | ICD-10-CM | POA: Diagnosis not present

## 2018-01-02 DIAGNOSIS — K219 Gastro-esophageal reflux disease without esophagitis: Secondary | ICD-10-CM | POA: Diagnosis not present

## 2018-01-02 DIAGNOSIS — Z9181 History of falling: Secondary | ICD-10-CM | POA: Diagnosis not present

## 2018-01-02 DIAGNOSIS — M80051D Age-related osteoporosis with current pathological fracture, right femur, subsequent encounter for fracture with routine healing: Secondary | ICD-10-CM | POA: Diagnosis not present

## 2018-01-02 DIAGNOSIS — Z7901 Long term (current) use of anticoagulants: Secondary | ICD-10-CM | POA: Diagnosis not present

## 2018-01-08 DIAGNOSIS — M25551 Pain in right hip: Secondary | ICD-10-CM | POA: Diagnosis not present

## 2018-01-13 ENCOUNTER — Other Ambulatory Visit (INDEPENDENT_AMBULATORY_CARE_PROVIDER_SITE_OTHER): Payer: Medicare Other

## 2018-01-13 DIAGNOSIS — R748 Abnormal levels of other serum enzymes: Secondary | ICD-10-CM

## 2018-01-13 DIAGNOSIS — D62 Acute posthemorrhagic anemia: Secondary | ICD-10-CM | POA: Diagnosis not present

## 2018-01-13 NOTE — Addendum Note (Signed)
Addended by: Arby Barrette on: 01/13/2018 02:18 PM   Modules accepted: Orders

## 2018-01-14 LAB — CBC WITH DIFFERENTIAL/PLATELET
BASOS ABS: 60 {cells}/uL (ref 0–200)
BASOS PCT: 0.6 %
EOS ABS: 300 {cells}/uL (ref 15–500)
Eosinophils Relative: 3 %
HCT: 42.7 % (ref 38.5–50.0)
HEMOGLOBIN: 13.9 g/dL (ref 13.2–17.1)
Lymphs Abs: 2970 cells/uL (ref 850–3900)
MCH: 28.7 pg (ref 27.0–33.0)
MCHC: 32.6 g/dL (ref 32.0–36.0)
MCV: 88 fL (ref 80.0–100.0)
MPV: 9.4 fL (ref 7.5–12.5)
Monocytes Relative: 8.7 %
NEUTROS ABS: 5800 {cells}/uL (ref 1500–7800)
Neutrophils Relative %: 58 %
Platelets: 358 10*3/uL (ref 140–400)
RBC: 4.85 10*6/uL (ref 4.20–5.80)
RDW: 12.8 % (ref 11.0–15.0)
Total Lymphocyte: 29.7 %
WBC: 10 10*3/uL (ref 3.8–10.8)
WBCMIX: 870 {cells}/uL (ref 200–950)

## 2018-01-14 LAB — COMPREHENSIVE METABOLIC PANEL
AG Ratio: 1.3 (calc) (ref 1.0–2.5)
ALT: 12 U/L (ref 9–46)
AST: 15 U/L (ref 10–35)
Albumin: 3.9 g/dL (ref 3.6–5.1)
Alkaline phosphatase (APISO): 112 U/L (ref 40–115)
BUN: 14 mg/dL (ref 7–25)
CO2: 25 mmol/L (ref 20–32)
CREATININE: 0.98 mg/dL (ref 0.70–1.11)
Calcium: 9.1 mg/dL (ref 8.6–10.3)
Chloride: 105 mmol/L (ref 98–110)
GLOBULIN: 3.1 g/dL (ref 1.9–3.7)
GLUCOSE: 75 mg/dL (ref 65–99)
Potassium: 4.4 mmol/L (ref 3.5–5.3)
Sodium: 138 mmol/L (ref 135–146)
Total Bilirubin: 1.1 mg/dL (ref 0.2–1.2)
Total Protein: 7 g/dL (ref 6.1–8.1)

## 2018-01-14 LAB — IRON,TIBC AND FERRITIN PANEL
%SAT: 25 % (ref 20–48)
FERRITIN: 53 ng/mL (ref 24–380)
Iron: 66 ug/dL (ref 50–180)
TIBC: 267 ug/dL (ref 250–425)

## 2018-01-15 DIAGNOSIS — K219 Gastro-esophageal reflux disease without esophagitis: Secondary | ICD-10-CM | POA: Diagnosis not present

## 2018-01-15 DIAGNOSIS — M80051D Age-related osteoporosis with current pathological fracture, right femur, subsequent encounter for fracture with routine healing: Secondary | ICD-10-CM | POA: Diagnosis not present

## 2018-01-15 DIAGNOSIS — I10 Essential (primary) hypertension: Secondary | ICD-10-CM | POA: Diagnosis not present

## 2018-01-15 DIAGNOSIS — H9111 Presbycusis, right ear: Secondary | ICD-10-CM | POA: Diagnosis not present

## 2018-02-26 DIAGNOSIS — M25551 Pain in right hip: Secondary | ICD-10-CM | POA: Diagnosis not present

## 2018-03-26 ENCOUNTER — Telehealth: Payer: Self-pay | Admitting: Internal Medicine

## 2018-03-26 NOTE — Telephone Encounter (Signed)
YES

## 2018-03-26 NOTE — Telephone Encounter (Signed)
Pt was scheduled on 04/15/18 in the afternoon, we are closed that afternoon. Called to reschedule appt. Pt's wife was wondering since she has a 1:30 that day if he could come too. Could we schedule him for 1pm on Friday, Jan. 3rd?

## 2018-04-15 ENCOUNTER — Encounter

## 2018-04-15 ENCOUNTER — Ambulatory Visit: Payer: Medicare Other | Admitting: Internal Medicine

## 2018-04-18 ENCOUNTER — Ambulatory Visit (INDEPENDENT_AMBULATORY_CARE_PROVIDER_SITE_OTHER): Payer: Medicare Other | Admitting: Internal Medicine

## 2018-04-18 ENCOUNTER — Encounter: Payer: Self-pay | Admitting: Internal Medicine

## 2018-04-18 DIAGNOSIS — N3942 Incontinence without sensory awareness: Secondary | ICD-10-CM

## 2018-04-18 DIAGNOSIS — R4189 Other symptoms and signs involving cognitive functions and awareness: Secondary | ICD-10-CM | POA: Diagnosis not present

## 2018-04-18 MED ORDER — ZOSTER VAC RECOMB ADJUVANTED 50 MCG/0.5ML IM SUSR: 0.5000 mL | Freq: Once | INTRAMUSCULAR | 1 refills | Status: AC

## 2018-04-18 NOTE — Patient Instructions (Signed)
You should walk to the mailbox 3 times every day  To keep your joints from getting stiff   I would continue the memory medicine

## 2018-04-18 NOTE — Progress Notes (Signed)
Subjective:  Patient ID: Corey Todd, male    DOB: 1927/04/23  Age: 85 y.o. MRN: [id number]  CC: Diagnoses of Cognitive changes and Urinary incontinence without sensory awareness were pertinent to this visit.  HPI YACOB WILKERSON presents for FOLLOW UP on multiple chronic conditions.    HTN<  Edema ,  Recent righ hip  fracture    Wt loss of 12 lbs since last visit   Due to 6 weeks of anorexia post operatively.  His wife reports that his appetite has returned to normal and he is steadily gaining weight based on home surveillance weights.  vowels moving well .  Not constipated  No diarrhea ,    Using walker only for long distances,  Using a cane mostly  .  One story  House. Marland Kitchen  Has safety rails in shower  And a seat.    Cognitive change: He and his wife note some memory loss since the hip fracture in August. .  Forgets to take meds despite using a pill box.  Has left the water running I the kitchen sink  .  Still driving ,  Has resumed drinving in the last 3-4 weeks due to hip fracture Forgot his hearing aids today  Broke on . Has appt with audiology next month at [facility]   Has no urge to urinate,  Wearing a depends diaper due to frequent incontinence.Took him to the [facility], an internist Dr Felton Clinton,  in [place] 5 mg  .  Has been set up to see neurologist after he has an  MRI brain . Next week .  Had bloodwork done but not noitfied   . Marland Kitchen   Outpatient Medications Prior to Visit  Medication Sig Dispense Refill  . doxazosin (CARDURA) 2 MG tablet TAKE 1 TABLET BY MOUTH AT  BEDTIME 90 tablet 1  . ferrous fumarate-iron polysaccharide complex (TANDEM) 162-115.2 MG CAPS capsule Take 1 capsule by mouth daily with breakfast. 30 capsule 1  . finasteride (PROSCAR) 5 MG tablet TAKE 1 TABLET BY MOUTH  DAILY 90 tablet 1  . Multiple Vitamins-Minerals (CEROVITE PO) Take 1 tablet by mouth daily.    . polyethylene glycol (MIRALAX / GLYCOLAX) packet Take 17 g by  mouth daily. 14 each 0  . ranitidine (ZANTAC) 75 MG tablet Take 75 mg by mouth 2 (two) times daily as needed for heartburn.     . senna (SENOKOT) 8.6 MG TABS tablet Take 2 tablets by mouth 2 (two) times daily.    Marland Kitchen UNABLE TO FIND Diet Type: Regular     No facility-administered medications prior to visit.     Review of Systems;  Patient denies headache, fevers, malaise, unintentional weight loss, skin rash, eye pain, sinus congestion and sinus pain, sore throat, dysphagia,  hemoptysis , cough, dyspnea, wheezing, chest pain, palpitations, orthopnea, edema, abdominal pain, nausea, melena, diarrhea, constipation, flank pain, dysuria, hematuria, urinary  Frequency, nocturia, numbness, tingling, seizures,  Focal weakness, Loss of consciousness,  Tremor, insomnia, depression, anxiety, and suicidal ideation.      Objective:  BP 120/60 (BP Location: Left Arm, Patient Position: Sitting, Cuff Size: Normal)   Pulse 65   Temp 97.8 F (36.6 C) (Oral)   Wt 160 lb 12.8 oz (72.9 kg)   SpO2 98%   BMI 24.45 kg/m   BP Readings from Last 3 Encounters:  04/18/18 120/60  12/13/17 122/70  11/25/17 139/71    Wt Readings from Last 3  Encounters:  04/18/18 160 lb 12.8 oz (72.9 kg)  12/13/17 172 lb 1.9 oz (78.1 kg)  11/25/17 180 lb 6.4 oz (81.8 kg)    General appearance: alert, cooperative and appears stated age Ears: normal TM's and external ear canals both ears Throat: lips, mucosa, and tongue normal; teeth and gums normal Neck: no adenopathy, no carotid bruit, supple, symmetrical, trachea midline and thyroid not enlarged, symmetric, no tenderness/mass/nodules Back: symmetric, no curvature. ROM normal. No CVA tenderness. Lungs: clear to auscultation bilaterally Heart: regular rate and rhythm, S1, S2 normal, no murmur, click, rub or gallop Abdomen: soft, non-tender; bowel sounds normal; no masses,  no organomegaly Pulses: 2+ and symmetric Skin: Skin color, texture, turgor normal. No rashes or  lesions Lymph nodes: Cervical, supraclavicular, and axillary nodes normal.  Lab Results  Component Value Date   HGBA1C 5.6 10/16/2017   HGBA1C 5.4 12/26/2015    Lab Results  Component Value Date   CREATININE 0.98 01/13/2018   CREATININE 1.10 12/13/2017   CREATININE 0.94 11/16/2017    Lab Results  Component Value Date   WBC 10.0 01/13/2018   HGB 13.9 01/13/2018   HCT 42.7 01/13/2018   PLT 358 01/13/2018   GLUCOSE 75 01/13/2018   CHOL 146 11/21/2012   TRIG 108.0 11/21/2012   HDL 48.80 11/21/2012   LDLCALC 76 11/21/2012   ALT 12 01/13/2018   AST 15 01/13/2018   NA 138 01/13/2018   K 4.4 01/13/2018   CL 105 01/13/2018   CREATININE 0.98 01/13/2018   BUN 14 01/13/2018   CO2 25 01/13/2018   TSH 4.21 10/16/2017   INR 1.02 11/14/2017   HGBA1C 5.6 10/16/2017   MICROALBUR <0.7 06/26/2016    No results found.  Assessment & Plan:   Problem List Items Addressed This Visit    Cognitive changes    He has developed short term memory loss since October, and is scheduled to have an Mri and see a neurologist in the very near future through the [facility].  Records to be requested .  He is now taking Aricept prescribed by [facility] internist   Lab Results  Component Value Date   TSH 4.21 10/16/2017         Incontinence of urine    He has no urge and leaks continually.  New onset,  With history of BPH. Continue use of adult diapers pending neurology evaluation. Rule out NPH         I am having Brayton Caves start on Zoster Vaccine Adjuvanted. I am also having him maintain his ranitidine, doxazosin, finasteride, polyethylene glycol, Multiple Vitamins-Minerals (CEROVITE PO), senna, UNABLE TO FIND, and ferrous fumarate-iron polysaccharide complex.  Meds ordered this encounter  Medications  . Zoster Vaccine Adjuvanted [facility]) injection    Sig: Inject 0.5 mLs into the muscle once for 1 dose.    Dispense:  1 each    Refill:  1    There are no discontinued  medications.  Follow-up: Return in about 6 months (around 10/17/2018).   Crecencio Mc, MD

## 2018-04-20 DIAGNOSIS — F01518 Vascular dementia, unspecified severity, with other behavioral disturbance: Secondary | ICD-10-CM | POA: Insufficient documentation

## 2018-04-20 DIAGNOSIS — R4189 Other symptoms and signs involving cognitive functions and awareness: Secondary | ICD-10-CM | POA: Insufficient documentation

## 2018-04-20 DIAGNOSIS — R32 Unspecified urinary incontinence: Secondary | ICD-10-CM | POA: Insufficient documentation

## 2018-04-20 DIAGNOSIS — F0151 Vascular dementia with behavioral disturbance: Secondary | ICD-10-CM | POA: Insufficient documentation

## 2018-04-20 NOTE — Assessment & Plan Note (Addendum)
He has developed short term memory loss since October, and is scheduled to have an Mri and see a neurologist in the very near future through the [facility].  Records to be requested .  He is now taking Aricept prescribed by [facility] internist   Lab Results  Component Value Date   TSH 4.21 10/16/2017

## 2018-04-20 NOTE — Assessment & Plan Note (Addendum)
He has no urge and leaks continually.  New onset,  With history of BPH. Continue use of adult diapers pending neurology evaluation. Rule out NPH

## 2018-04-22 ENCOUNTER — Ambulatory Visit: Payer: Medicare Other

## 2018-05-16 ENCOUNTER — Telehealth: Payer: Self-pay | Admitting: Internal Medicine

## 2018-05-16 NOTE — Telephone Encounter (Signed)
Copied from [facility] [phone] [phone]. Topic: Quick Communication - See Telephone Encounter >> May 16, 2018  3:25 PM Blase Mess A wrote: CRM for notification. See Telephone encounter for: 05/16/18.  [name] is calling to ask Dr. Lupita Dawn  advise. The patient fell back in 11/13/17. The patient's wife took him to the [place]. They took at CAT scan. Does Dr. [name] want to review the CAT scan?  They can bring it to the office. Please advise. Thank you

## 2018-05-17 NOTE — Telephone Encounter (Signed)
I don't read CT scans,  I rely on the radiologist's report, which I WOULD like a copy of .  I recommend that she call [facility]iology, because they may be able to  upload the images into the [facility] electronic system and that way we would have them for future reference

## 2018-05-22 NOTE — Telephone Encounter (Signed)
Spoke with pt's wife and informed her that Dr. [name] does not read CT scans that she relies on the report from the radiologist and that she would like a copy of the report. Pt's wife stated that they would bring it with them to the next appt.

## 2018-06-20 ENCOUNTER — Other Ambulatory Visit: Payer: Self-pay | Admitting: Internal Medicine

## 2018-08-29 NOTE — Telephone Encounter (Signed)
error 

## 2018-09-26 ENCOUNTER — Other Ambulatory Visit: Payer: Self-pay

## 2018-09-26 ENCOUNTER — Ambulatory Visit (INDEPENDENT_AMBULATORY_CARE_PROVIDER_SITE_OTHER): Payer: Medicare Other | Admitting: Family Medicine

## 2018-09-26 ENCOUNTER — Telehealth: Payer: Self-pay

## 2018-09-26 VITALS — BP 122/56 | HR 95 | Temp 98.6°F | Resp 18 | Ht 66.0 in | Wt 173.2 lb

## 2018-09-26 DIAGNOSIS — J3489 Other specified disorders of nose and nasal sinuses: Secondary | ICD-10-CM

## 2018-09-26 NOTE — Telephone Encounter (Signed)
Copied from [facility] [id number]. Topic: General - Other >> Sep 25, 2018  4:23 PM Leward Quan A wrote: Reason for CRM: Patient wife called to say that he has a spot on his nose that has doubled in size recently and she would like for Dr [name] to take a look at it. Ph# [phone]   Called and spoke with wife, pt has a appt today about nose, screened for covid  And informed to wear a mask.  Pt understood.  Nina,cma

## 2018-09-26 NOTE — Progress Notes (Signed)
Subjective:    Patient ID: Corey Todd, male    DOB: [birth year], 85 y.o.   MRN: [id number]  HPI   Patient presents to clinic due to lesion on nose.  Per patient he estimates the spot has been there for about 6 months but it is possible to be there longer.  Patient states the area has gotten bigger and his wife became concerned.  Patient also states he has scratched at it causing some bleeding.  He did put some Neosporin type ointment on the area, but the lesion did not resolve.  Patient does not regularly wear any sort of sunscreen.  Patient Active Problem List   Diagnosis Date Noted  . Cognitive changes 04/20/2018  . Incontinence of urine 04/20/2018  . Iron deficiency anemia 12/15/2017  . Hospital discharge follow-up 12/15/2017  . Bilateral lower extremity edema 11/25/2017  . Closed right hip fracture, sequela 11/14/2017  . Right leg swelling 10/17/2017  . Right leg weakness 10/17/2017  . Herpes zoster without complication 57/32/2025  . Encounter for preventive health examination 02/14/2017  . Skin lesion of face 09/09/2015  . Counseling regarding advanced directives and goals of care 12/26/2014  . Do not resuscitate discussion 12/26/2014  . Hypertension 08/07/2014  . Encounter for Medicare annual wellness exam 11/18/2012  . GERD (gastroesophageal reflux disease) 05/09/2012  . Obesity (BMI 30-39.9) 05/09/2012  . Presbyacusis   . BPH (benign prostatic hyperplasia)    Social History   Tobacco Use  . Smoking status: Former Smoker    Quit date: 04/16/1954    Years since quitting: 64.4  . Smokeless tobacco: Never Used  Substance Use Topics  . Alcohol use: No   Review of Systems  Constitutional: Negative for chills, fatigue and fever.  HENT: Negative for congestion, ear pain, sinus pain and sore throat.   Eyes: Negative.   Respiratory: Negative for cough, shortness of breath and wheezing.   Cardiovascular: Negative for chest pain, palpitations and leg swelling.   Gastrointestinal: Negative for abdominal pain, diarrhea, nausea and vomiting.  Genitourinary: Negative for dysuria, frequency and urgency.  Musculoskeletal: Negative for arthralgias and myalgias.  Skin: +spot on nose Neurological: Negative for syncope, light-headedness and headaches.  Psychiatric/Behavioral: The patient is not nervous/anxious.       Objective:   Physical Exam Vitals signs and nursing note reviewed.  Constitutional:      General: He is not in acute distress.    Appearance: He is not toxic-appearing.  HENT:     Head: Normocephalic and atraumatic.  Eyes:     General: No scleral icterus.    Extraocular Movements: Extraocular movements intact.     Conjunctiva/sclera: Conjunctivae normal.  Cardiovascular:     Rate and Rhythm: Normal rate and regular rhythm.     Heart sounds: Normal heart sounds.  Pulmonary:     Effort: Pulmonary effort is normal.     Breath sounds: Normal breath sounds.  Skin:    General: Skin is dry.     Comments: +lesion on nose (see attached photograph).   Neurological:     Mental Status: He is alert and oriented to person, place, and time. Mental status is at baseline.     Gait: Gait abnormal (walks with cane, normal for patient).  Psychiatric:        Mood and Affect: Mood normal.        Behavior: Behavior normal.            Today's Vitals   09/26/18 0950  BP: (!) 122/56  Pulse: 95  Resp: 18  Temp: 98.6 F (37 C)  TempSrc: Oral  SpO2: 95%  Weight: 173 lb 3.2 oz (78.6 kg)  Height: 5\' 6"  (1.676 m)   Body mass index is 27.96 kg/m.  Assessment & Plan:    Lesion on nose - patient's nose lesion is suspicious for a melanoma.  I have placed urgent dermatology referral and have made our referral coordinator aware of this referral so we can be sure and get scheduled quickly.  Discussed with patient the importance of can protection by wearing a wide brim hat, sunscreen, long sleeves when going to be outdoors for extended periods of  time.   Patient will otherwise keep regularly scheduled follow-up with PCP as planned.  Advised if he does not hear anything about the referral in the next week to let us know.  I will also be on high alert to be sure this referral is taking care of promptly.

## 2018-09-29 ENCOUNTER — Other Ambulatory Visit: Payer: Self-pay | Admitting: Family

## 2018-09-29 ENCOUNTER — Encounter: Payer: Self-pay | Admitting: Family

## 2018-09-29 ENCOUNTER — Ambulatory Visit: Payer: Self-pay

## 2018-09-29 ENCOUNTER — Ambulatory Visit (INDEPENDENT_AMBULATORY_CARE_PROVIDER_SITE_OTHER): Payer: Medicare Other | Admitting: Family

## 2018-09-29 ENCOUNTER — Ambulatory Visit (INDEPENDENT_AMBULATORY_CARE_PROVIDER_SITE_OTHER)
Admission: RE | Admit: 2018-09-29 | Discharge: 2018-09-29 | Disposition: A | Discharge status: Home / Self Care | Payer: Medicare Other | Source: Ambulatory Visit | Attending: Family | Admitting: Family

## 2018-09-29 DIAGNOSIS — K5900 Constipation, unspecified: Secondary | ICD-10-CM

## 2018-09-29 DIAGNOSIS — R194 Change in bowel habit: Secondary | ICD-10-CM | POA: Diagnosis not present

## 2018-09-29 MED ORDER — LACTULOSE 20 GM/30ML PO SOLN: ORAL | 1 refills | Status: DC

## 2018-09-29 NOTE — Patient Instructions (Addendum)
Abdominal xray today  We start another medication for constipation.   Constipation plan  1) Take Miralax once to twice per day until you have a bowel movement. You will end up titrating the use of Miralax. For example, you  may find that using the medication every other day or three times a week is a good bowel regimen for you. Or perhaps, twice weekly.   2)  If you do not get results with Miralax alone, you may then add Bisacodyl suppository daily to regimen until you get desired bowel results.  3) Take Colace ( stool softener) twice daily every day.   It is MOST important to drink LOTS of water and follow a HIGH fiber diet to keep foods moving through the gut. You may add Metamucil to a beverage that you drink.  Information on prevention of constipation as well as acute treatment for constipation as included below.  If there is no improvement in your symptoms, or if there is any worsening of symptoms, or if you have any additional concerns, please return to this clinic for re-evaluation; or, if we are closed, consider going to the Emergency Room for evaluation.    Constipation Prevention What is Constipation? Constipation is hard, dry bowel movements or the inability to have a bowel movement.  You can also feel like you need to have a bowel movement but not be able to.  It can also be painful when you strain to have a bowel movement.  Taking narcotic pain medicine after surgery can make you constipated, even if you have never had a problem with constipation. What Do I Need To Do? The best thing to do for constipation is to keep it from happening.  This can be done by: Adding laxatives to your daily routine, when taking prescription pain medicines after surgery. Add 17 gm Miralax daily or 100 mg Colace once or twice daily. (Miralax is mixed in water. Colace is a pill). They soften your bowel movements to make them easier to pass and hurt less. Drink plenty of water to help flush your  bowels.  (Eight, 8 ounce glasses daily) Eat foods high in fiber such as whole grains, vegetables, cereals, fruits, and prune juice (5-7 servings a day or 25 grams).  If you do not know how much fiber a food has in it you can look on the label under "dietary fiber."  If you have trouble getting enough fiber in your diet you may want to consider a fiber supplement such as Metamucil or Citrucel.  Also, be aware that eating fiber without drinking enough water can make constipation worse. If you do become constipated some medications that may help are: Bisacodyl (Dulcolax) is available in tablet form or a suppository. Glycerin suppositories are also a good choice if you need a fast acting medication. Everybody is different and may have different results.  Talk to your pharmacist or health care provider about your specific problems. They can help you choose the best product for you.  Why Is It Important for Me To Do This? Being constipated is not something you have to live with.  There are many things you can do to help.   Feeling bad can interfere with your recovery after surgery.  If constipation goes on for too long it can become a very serious medical problem. You may need to visit your doctor or go to the hospital.  That is why it is very important to drink lots of water, eat enough fiber,  and keep it from happening.  Ask Questions We want to answer all of your questions and concerns.  That's why we encourage you to use a program called Ask Me 3T, created by the [employer].  By using Ask Me 3T you are encouraged to ask 3 simple (yet, potentially life saving questions) whenever you are talking with your physician, nurse or pharmacist: What is my main problem? What do I need to do? Why is it important for me to do this? By understanding the answer to these three questions and any other questions you may have, you have the knowledge necessary to manage your health. Please  feel very comfortable asking any questions. Healthcare is complicated, so if you hear an answer you do not understand, please ask your health care team to explain again.   Sources: [employer] 02-02-10 N

## 2018-09-29 NOTE — Telephone Encounter (Signed)
Pt had a telephone visit with Mable Paris, NP today.

## 2018-09-29 NOTE — Telephone Encounter (Signed)
Incoming call from Patient with a complaint of constipation.  Constipation for 2 to 3 weeks.  Has tried various  OTC medication.  No results .  Denies his stomach  being distended . Denies passing gas.   Has tried Prune juice.   Int/day.  ake of water is about 3 glasses/day.    Encouraged to increase his water intake.  Reviewed Protocol and  Provided care advice.  Transferred caller to [facility]  station office to schedule an appointment.  Patient voiced under standing.     [place] Male, 85 y.o., [birth year] MRN:  [id number] Phone:  [phone] (H) PCP:  Crecencio Mc, MD Primary Cvg:  [place] [employer] Message from Jodie Echevaria sent at 09/29/2018 8:56 AM EDT  Patient wife called to say that he has been constipated for a while. Say that she tried everything she can think of. Please advise Ph# [phone]   Call History   Type Contact  09/29/2018 08:55 AM EDT Phone (Incoming) Barnhill,LOUISE B (Emergency Contact)  Phone: [phone]  User: Jodie Echevaria    Reason for Disposition . Mild constipation  Answer Assessment - Initial Assessment Questions 1. STOOL PATTERN OR FREQUENCY: "How often do you pass bowel movements (BMs)?"  (Normal range: tid to q 3 days)  "When was the last BM passed?"     2weeks 2. STRAINING: "Do you have to strain to have a BM?"      yes 3. RECTAL PAIN: "Does your rectum hurt when the stool comes out?" If so, ask: "Do you have hemorrhoids? How bad is the pain?"  (Scale 1-10; or mild, moderate, severe)     denies 4. STOOL COMPOSITION: "Are the stools hard?"      yes 5. BLOOD ON STOOLS: "Has there been any blood on the toilet tissue or on the surface of the BM?" If so, ask: "When was the last time?"     denies 6. CHRONIC CONSTIPATION: "Is this a new problem for you?"  If no, ask: "How long have you had this problem?" (days, weeks, months)      2 weks 7. CHANGES IN DIET: "Have there been any recent changes in your diet?"       denies 8. MEDICATIONS: "Have you been taking any new medications?"     denies 9. LAXATIVES: "Have you been using any laxatives or enemas?"  If yes, ask "What, how often, and when was the last time?"     1E, Mixlax since yes 10. CAUSE: "What do you think is causing the constipation?"        *No Answer* 11. OTHER SYMPTOMS: "Do you have any other symptoms?" (e.g., abdominal pain, fever, vomiting)       Denies 12. PREGNANCY: "Is there any chance you are pregnant?" "When was your last menstrual period?"      na  Protocols used: CONSTIPATION-A-AH

## 2018-09-29 NOTE — Progress Notes (Signed)
Verbal consent for services obtained from patient prior to services given.  Location of call:  provider at work patient at home  Names of all persons present for services: Mable Paris, NP Chief complaint:   CC: constipation chronic, 'on going' for years off and on.   Spoke with wife  Not sure of last BM, and worried it has been several days.  Started to take XLax ( 10 doses since 4pm yesterday) , enema, without relief. Never had much luck with miralax, hasnt tried. Would like something 'stronger.'  Not on colace, miralax.  Not passing gas.  No abdominal pain, fever, abdominal distention.   Walks with cane. 'sits and sleeps all the time.'  Drinking 1 cup per hour today.   Good appetite. No weight loss. No abdominal surgery.   History, background, results pertinent:  Patient HOH.  Right Hip fracture 11/2017  A/P/next steps: Problem List Items Addressed This Visit      Other   Constipation - Primary    Acute on chronic. NO abdominal pain. Somewhat poor historian in regards to last BM. XR negative for obstruction. Patient/wife prefer alternative to colace, miralax. Trial of lactulose. Wife will let me know if by tomorrow no BM.      Relevant Medications   Lactulose 20 GM/30ML SOLN   Other Relevant Orders   DG Abd 2 Views       I spent 25 min  discussing plan of care over the phone.

## 2018-09-29 NOTE — Telephone Encounter (Signed)
Pt's wife called and wanted  Pt to see Dr. [name] for sever constipation, I scheduled the pt with margaret Arnett for his constipation.  Romen Yutzy,cma

## 2018-09-29 NOTE — Assessment & Plan Note (Addendum)
Acute on chronic. NO abdominal pain. Somewhat poor historian in regards to last BM. XR negative for obstruction. Patient/wife prefer alternative to colace, miralax. Trial of lactulose. Wife will let me know if by tomorrow no BM.

## 2018-09-30 ENCOUNTER — Telehealth: Payer: Self-pay

## 2018-09-30 ENCOUNTER — Ambulatory Visit: Payer: Self-pay | Admitting: *Deleted

## 2018-09-30 NOTE — Telephone Encounter (Signed)
Copied from [place] [id number]. Topic: General - Call Back - No Documentation >> Sep 30, 2018  1:50 PM Erick Blinks wrote: Reason for CRM: Pt requesting call back. Constipation medication reported to not have an effect. Took it through 6 pm yesterday all throughout the night as well.  Best Contact: [phone] Please advise.

## 2018-09-30 NOTE — Telephone Encounter (Signed)
Xray ordered for patient yesterday by M.Arnett, NP due to no BM in several days.Xray was unremarkable. Lactulose was prescribed. Wife reports he started with the lactulose at 6p yesterday and has taken a dose every 4 hours. Has had a total of 5 doses with no results. She will push water on the patient to help with this process, have him sit on potty every other hour. Offer prunes/prune juice to aid. Patient is not home at this time. When he returns she will assess his abdomen for harness/distention/pain.If any of those symptoms she will call back.  Will monitor for stool today and tonight. Will hold off on Lactulose for the rest of the day. Routing to PCP for any further advice.   Reason for Disposition . Constipation is a chronic symptom (recurrent or ongoing AND present > 4 weeks)  Answer Assessment - Initial Assessment Questions 1. STOOL PATTERN OR FREQUENCY: "How often do you pass bowel movements (BMs)?"  (Normal range: tid to q 3 days)  "When was the last BM passed?"       Possibly Friday or Saturday. 2. STRAINING: "Do you have to strain to have a BM?"      yes 3. RECTAL PAIN: "Does your rectum hurt when the stool comes out?" If so, ask: "Do you have hemorrhoids? How bad is the pain?"  (Scale 1-10; or mild, moderate, severe)     None reported 4. STOOL COMPOSITION: "Are the stools hard?"      yes 5. BLOOD ON STOOLS: "Has there been any blood on the toilet tissue or on the surface of the BM?" If so, ask: "When was the last time?"      no 6. CHRONIC CONSTIPATION: "Is this a new problem for you?"  If no, ask: "How long have you had this problem?" (days, weeks, months)      Yes, chronic 7. CHANGES IN DIET: "Have there been any recent changes in your diet?"      no 8. MEDICATIONS: "Have you been taking any new medications?"     no 9. LAXATIVES: "Have you been using any laxatives or enemas?"  If yes, ask "What, how often, and when was the last time?"     Sunday and Monday he took 10 exlax  without any results. 10. CAUSE: "What do you think is causing the constipation?"        no 11. OTHER SYMPTOMS: "Do you have any other symptoms?" (e.g., abdominal pain, fever, vomiting)       None of these 12. PREGNANCY: "Is there any chance you are pregnant?" "When was your last menstrual period?"       na  Protocols used: CONSTIPATION-A-AH

## 2018-09-30 NOTE — Telephone Encounter (Signed)
Patient wife say he has taken about 5 doses of Lactulose with no relief, has been passing flatulence, denies abdominal pain, appetite is good, patient has always had constipation.  Patient stated he feels well, in conversation wife stated he took lactulose through the night X 2 doses patient said he could not remember taking, wife confirms taking. Patient X-ray showed no blockage per NP whom called nurse and ask to forward to PCP for advice. Scheduled patient follow up with PCP for Friday.  Advised patient in meantime if abdominal pain or distention develops, nausea , vomiting or any new symptoms he would need to be evaluated sooner.

## 2018-09-30 NOTE — Telephone Encounter (Signed)
If the lactulose didn't work he can try magnesium citrate.  It is a liquid and usually works within 30 minutes to n hour.  availabla OTC

## 2018-10-01 NOTE — Telephone Encounter (Signed)
Patient notified and voiced understanding.

## 2018-10-01 NOTE — Telephone Encounter (Signed)
Thanks Tullo for your advice here.   I'll have sarah my nurse check on him today as well.   Sarah, see below notes and Tullo's recommendation.   Please ensure patient is feeling well, eating, drinking. No fever, abdominal pain.   He can keep [name] Friday appt with Tullo unless wife feels he needs to be evaluated sooner.

## 2018-10-02 DIAGNOSIS — L821 Other seborrheic keratosis: Secondary | ICD-10-CM | POA: Diagnosis not present

## 2018-10-02 DIAGNOSIS — D485 Neoplasm of uncertain behavior of skin: Secondary | ICD-10-CM | POA: Diagnosis not present

## 2018-10-02 DIAGNOSIS — D2260 Melanocytic nevi of unspecified upper limb, including shoulder: Secondary | ICD-10-CM | POA: Diagnosis not present

## 2018-10-02 DIAGNOSIS — D2272 Melanocytic nevi of left lower limb, including hip: Secondary | ICD-10-CM | POA: Diagnosis not present

## 2018-10-02 DIAGNOSIS — C44311 Basal cell carcinoma of skin of nose: Secondary | ICD-10-CM | POA: Diagnosis not present

## 2018-10-02 DIAGNOSIS — D225 Melanocytic nevi of trunk: Secondary | ICD-10-CM | POA: Diagnosis not present

## 2018-10-02 DIAGNOSIS — L812 Freckles: Secondary | ICD-10-CM | POA: Diagnosis not present

## 2018-10-02 DIAGNOSIS — D2230 Melanocytic nevi of unspecified part of face: Secondary | ICD-10-CM | POA: Diagnosis not present

## 2018-10-02 NOTE — Telephone Encounter (Signed)
Pt wife is calling this morning her husband had 2 small bowel movements yesterday afternoon.

## 2018-10-03 ENCOUNTER — Ambulatory Visit (INDEPENDENT_AMBULATORY_CARE_PROVIDER_SITE_OTHER): Payer: Medicare Other | Admitting: Internal Medicine

## 2018-10-03 ENCOUNTER — Other Ambulatory Visit: Payer: Self-pay

## 2018-10-03 DIAGNOSIS — K5900 Constipation, unspecified: Secondary | ICD-10-CM

## 2018-10-03 NOTE — Progress Notes (Signed)
Telephone Note  This visit type was conducted due to national recommendations for restrictions regarding the COVID-19 pandemic (e.g. social distancing).  This format is felt to be most appropriate for this patient at this time.  All issues noted in this document were discussed and addressed.  No physical exam was performed (except for noted visual exam findings with Video Visits).   I connected with@ on 10/03/18 at 11:30 AM EDT by  telephone and verified that I am speaking with the correct person using two identifiers. Location patient: home Location provider: work or home office Persons participating in the virtual visit: patient, provider and wife Posey Pronto  I discussed the limitations, risks, security and privacy concerns of performing an evaluation and management service by telephone and the availability of in person appointments. I also discussed with the patient that there may be a patient responsible charge related to this service. The patient expressed understanding and agreed to proceed.   Reason for visit: persistent constipation  HPI:   85 yr old male with cognitive changes c/w dementia presents  With a  7th day history of obstipation that thus far has not responded to lactulose.  History provided by wife mostly,  But patient did offer some details. Saw Margaret Jun 15 (4 days ago) and given Lactulose after plain films of abdomen showed a normal BG pattern.  He  Took several doses, with no results , now with partial success using mg citrate .  He has no nausea, fevers, or diarrhea. l   ROS: See pertinent positives and negatives per HPI.  Past Medical History:  Diagnosis Date  . Anxiety   . Arthritis   . BPH (benign prostatic hypertrophy)   . DVT (deep vein thrombosis) in pregnancy   . Presbyacusis    right sided hearing aid    Past Surgical History:  Procedure Laterality Date  . EYE SURGERY     cataract surgery  . HERNIA REPAIR    . INTRAMEDULLARY (IM) NAIL  INTERTROCHANTERIC Right 11/14/2017   Procedure: INTRAMEDULLARY (IM) NAIL INTERTROCHANTRIC;  Surgeon: Leim Fabry, MD;  Location: [facility] ORS;  Service: Orthopedics;  Laterality: Right;  . JOINT REPLACEMENT  1953   ankle crush injury, right  . PROSTATE SURGERY     biopsy   . TONSILLECTOMY AND ADENOIDECTOMY  1936  . TOTAL HIP ARTHROPLASTY  2007   left , Dr Cay Schillings    Family History  Problem Relation Age of Onset  . Arthritis Mother   . Kidney cancer Neg Hx   . Kidney disease Neg Hx   . Prostate cancer Neg Hx     SOCIAL HX: married  livs with wife.  Some cigntive decline but still IADLS.  No alcohol or tobacco currently    Current Outpatient Medications:  .  donepezil (ARICEPT) 5 MG tablet, Take 5 mg by mouth at bedtime., Disp: , Rfl:  .  doxazosin (CARDURA) 2 MG tablet, TAKE 1 TABLET BY MOUTH AT  BEDTIME, Disp: 90 tablet, Rfl: 1 .  finasteride (PROSCAR) 5 MG tablet, TAKE 1 TABLET BY MOUTH  DAILY, Disp: 90 tablet, Rfl: 1 .  Lactulose 20 GM/30ML SOLN, 30 ml every 4 hours until constipation is relieved., Disp: 236 mL, Rfl: 1 .  Multiple Vitamins-Minerals (CEROVITE PO), Take 1 tablet by mouth daily., Disp: , Rfl:  .  polyethylene glycol (MIRALAX / GLYCOLAX) packet, Take 17 g by mouth daily., Disp: 14 each, Rfl: 0 .  ranitidine (ZANTAC) 75 MG tablet, Take 75 mg by mouth  2 (two) times daily as needed for heartburn. , Disp: , Rfl:  .  senna (SENOKOT) 8.6 MG TABS tablet, Take 2 tablets by mouth 2 (two) times daily., Disp: , Rfl:  .  UNABLE TO FIND, Diet Type: Regular, Disp: , Rfl:   EXAM:   General impression: alert, cooperative and articulate.  No signs of being in distress  Lungs: speech is fluent sentence length suggests that patient is not short of breath and not punctuated by cough, sneezing or sniffing. Marland Kitchen   Psych: affect normal.  speech is articulate and non pressured .  Denies suicidal thoughts   ASSESSMENT AND PLAN:  Discussed the following assessment and  plan:  Constipation Reviewed water and fiber intake. Recommend thyroid and electrolyte assessment and increase water intake to 60 ounces daily.  Daily stool softener ,  MG capsule recommended     I discussed the assessment and treatment plan with the patient. The patient was provided an opportunity to ask questions and all were answered. The patient agreed with the plan and demonstrated an understanding of the instructions.   The patient was advised to call back or seek an in-person evaluation if the symptoms worsen or if the condition fails to improve as anticipated.  I provided 25 minutes of non-face-to-face time during this encounter.   Crecencio Mc, MD

## 2018-10-03 NOTE — Patient Instructions (Addendum)
Your x rays did not show any signs of a blockage in your bowels.  Continue drinking a MINIMUM OF 48 ounces of water daily  Add docusate (stool softener) 200 mg daily at bedtime  Add Miralax,  Citrucel, metamucil, or FiberCon DAILY AT BEDTIME.   Corey Todd will call you to set up the appointment next week   Do not use the magnesium citrate more than once a week

## 2018-10-03 NOTE — Telephone Encounter (Signed)
Noted  thankyou   He has f/u with pcp today

## 2018-10-05 NOTE — Assessment & Plan Note (Signed)
Reviewed water and fiber intake. Recommend thyroid and electrolyte assessment and increase water intake to 60 ounces daily.  Daily stool softener ,  MG capsule recommended

## 2018-10-06 ENCOUNTER — Telehealth: Payer: Self-pay | Admitting: *Deleted

## 2018-10-06 ENCOUNTER — Ambulatory Visit: Payer: Medicare Other | Admitting: Internal Medicine

## 2018-10-06 ENCOUNTER — Telehealth: Payer: Self-pay

## 2018-10-06 DIAGNOSIS — K5900 Constipation, unspecified: Secondary | ICD-10-CM

## 2018-10-06 NOTE — Telephone Encounter (Signed)
Please change x-ray order to Abdomen 1 view. We are not able to do 2 views in this office.   Thanks

## 2018-10-06 NOTE — Telephone Encounter (Signed)
-----   Message from Crecencio Mc, MD sent at 10/03/2018 11:56 AM EDT ----- NEEDS NON FASTING LABS AND ABD X RAY NEXT WEEK ,  NEEDS AVS SENT VIA MAIL

## 2018-10-06 NOTE — Telephone Encounter (Signed)
Pt has been scheduled for lab and xray appt. Pt's wife is aware of appt date and time.

## 2018-10-06 NOTE — Telephone Encounter (Signed)
This was in Margaret's box.  Looks like is Dr Lupita Dawn pt.

## 2018-10-07 NOTE — Telephone Encounter (Signed)
Looks like pt had the abdominal xray on 09/29/2018. Is pt need of another abdominal xray?

## 2018-10-07 NOTE — Telephone Encounter (Signed)
Yes,  That's why you called her and set it up yesterday:)  Order changed to  1 view

## 2018-10-08 ENCOUNTER — Other Ambulatory Visit (INDEPENDENT_AMBULATORY_CARE_PROVIDER_SITE_OTHER): Payer: Medicare Other

## 2018-10-08 ENCOUNTER — Other Ambulatory Visit: Payer: Self-pay

## 2018-10-08 ENCOUNTER — Ambulatory Visit (INDEPENDENT_AMBULATORY_CARE_PROVIDER_SITE_OTHER): Payer: Medicare Other

## 2018-10-08 DIAGNOSIS — K5900 Constipation, unspecified: Secondary | ICD-10-CM

## 2018-10-08 NOTE — Telephone Encounter (Signed)
CX ordered by [employer]

## 2018-10-09 DIAGNOSIS — L570 Actinic keratosis: Secondary | ICD-10-CM | POA: Diagnosis not present

## 2018-10-09 DIAGNOSIS — D485 Neoplasm of uncertain behavior of skin: Secondary | ICD-10-CM | POA: Diagnosis not present

## 2018-10-09 DIAGNOSIS — D229 Melanocytic nevi, unspecified: Secondary | ICD-10-CM | POA: Diagnosis not present

## 2018-10-09 DIAGNOSIS — C44311 Basal cell carcinoma of skin of nose: Secondary | ICD-10-CM | POA: Diagnosis not present

## 2018-10-09 DIAGNOSIS — Z1283 Encounter for screening for malignant neoplasm of skin: Secondary | ICD-10-CM | POA: Diagnosis not present

## 2018-10-09 DIAGNOSIS — D225 Melanocytic nevi of trunk: Secondary | ICD-10-CM | POA: Diagnosis not present

## 2018-10-09 DIAGNOSIS — D2272 Melanocytic nevi of left lower limb, including hip: Secondary | ICD-10-CM | POA: Diagnosis not present

## 2018-10-09 LAB — TSH: TSH: 4.09 u[IU]/mL (ref 0.35–4.50)

## 2018-10-09 LAB — COMPREHENSIVE METABOLIC PANEL
ALT: 11 U/L (ref 0–53)
AST: 14 U/L (ref 0–37)
Albumin: 3.9 g/dL (ref 3.5–5.2)
Alkaline Phosphatase: 67 U/L (ref 39–117)
BUN: 18 mg/dL (ref 6–23)
CO2: 23 mEq/L (ref 19–32)
Calcium: 8.7 mg/dL (ref 8.4–10.5)
Chloride: 105 mEq/L (ref 96–112)
Creatinine, Ser: 1.13 mg/dL (ref 0.40–1.50)
GFR: 61.05 mL/min (ref >60.00)
Glucose, Bld: 135 mg/dL — ABNORMAL HIGH (ref 70–99)
Potassium: 4.1 mEq/L (ref 3.5–5.1)
Sodium: 139 mEq/L (ref 135–145)
Total Bilirubin: 1.2 mg/dL (ref 0.2–1.2)
Total Protein: 6.8 g/dL (ref 6.0–8.3)

## 2018-12-03 DIAGNOSIS — D225 Melanocytic nevi of trunk: Secondary | ICD-10-CM | POA: Diagnosis not present

## 2019-01-12 ENCOUNTER — Other Ambulatory Visit: Payer: Self-pay

## 2019-01-13 ENCOUNTER — Encounter: Payer: Self-pay | Admitting: Family Medicine

## 2019-01-13 ENCOUNTER — Ambulatory Visit (INDEPENDENT_AMBULATORY_CARE_PROVIDER_SITE_OTHER): Payer: Medicare Other | Admitting: Family Medicine

## 2019-01-13 VITALS — Ht 66.0 in | Wt 175.0 lb

## 2019-01-13 DIAGNOSIS — R600 Localized edema: Secondary | ICD-10-CM

## 2019-01-13 MED ORDER — FUROSEMIDE 20 MG PO TABS: 10.0000 mg | ORAL_TABLET | Freq: Every day | ORAL | 0 refills | Status: DC | PRN

## 2019-01-13 NOTE — Progress Notes (Signed)
Patient ID: Corey Todd, male   DOB: 1928/01/28, 85 y.o.   MRN: [id number]    Virtual Visit via phone Note  This visit type was conducted due to national recommendations for restrictions regarding the COVID-19 pandemic (e.g. social distancing).  This format is felt to be most appropriate for this patient at this time.  All issues noted in this document were discussed and addressed.  No physical exam was performed (except for noted visual exam findings with Video Visits).   I connected with Corey Todd & wife today at 10:20 AM EDT by telephone and verified that I am speaking with the correct person using two identifiers. Location patient: home Location provider: work or home office Persons participating in the virtual visit: patient, provider  I discussed the limitations, risks, security and privacy concerns of performing an evaluation and management service by telephone and the availability of in person appointments. I also discussed with the patient that there may be a patient responsible charge related to this service. The patient expressed understanding and agreed to proceed.   HPI:  Patient, wife and I connected via telephone due to complaint of feet being swollen.  [name] states they will be more swollen at the end of the day, but do respond to elevating the legs.  States the legs do not elevate up above hip level in his lazy boy chair, but they have not yet tried putting a pillow under his feet to elevate them even more.  Wife states she is also encouraged husband to walk more, and this seems to help.  Wife also states they have tried compression stockings before, but they are too difficult for them to pull up over his legs.  Wife is interested in having a low-dose water pill to have on hand in times of need when feet are more swollen.  Patient is having no shortness of breath, no cough, no wheezing, no chest pain.  Per wife patient is eating and drinking as he normally  would.   ROS: See pertinent positives and negatives per HPI.  Past Medical History:  Diagnosis Date  . Anxiety   . Arthritis   . BPH (benign prostatic hypertrophy)   . DVT (deep vein thrombosis) in pregnancy   . Presbyacusis    right sided hearing aid    Past Surgical History:  Procedure Laterality Date  . EYE SURGERY     cataract surgery  . HERNIA REPAIR    . INTRAMEDULLARY (IM) NAIL INTERTROCHANTERIC Right 11/14/2017   Procedure: INTRAMEDULLARY (IM) NAIL INTERTROCHANTRIC;  Surgeon: Leim Fabry, MD;  Location: [facility] ORS;  Service: Orthopedics;  Laterality: Right;  . JOINT REPLACEMENT  1953   ankle crush injury, right  . PROSTATE SURGERY     biopsy   . TONSILLECTOMY AND ADENOIDECTOMY  1936  . TOTAL HIP ARTHROPLASTY  2007   left , Dr Cay Schillings    Family History  Problem Relation Age of Onset  . Arthritis Mother   . Kidney cancer Neg Hx   . Kidney disease Neg Hx   . Prostate cancer Neg Hx     Social History   Tobacco Use  . Smoking status: Former Smoker    Quit date: 04/16/1954    Years since quitting: 64.7  . Smokeless tobacco: Never Used  Substance Use Topics  . Alcohol use: No    Current Outpatient Medications:  .  donepezil (ARICEPT) 5 MG tablet, Take 5 mg by mouth at bedtime., Disp: , Rfl:  .  doxazosin (CARDURA) 2 MG tablet, TAKE 1 TABLET BY MOUTH AT  BEDTIME, Disp: 90 tablet, Rfl: 1 .  finasteride (PROSCAR) 5 MG tablet, TAKE 1 TABLET BY MOUTH  DAILY, Disp: 90 tablet, Rfl: 1 .  Lactulose 20 GM/30ML SOLN, 30 ml every 4 hours until constipation is relieved., Disp: 236 mL, Rfl: 1 .  Multiple Vitamins-Minerals (CEROVITE PO), Take 1 tablet by mouth daily., Disp: , Rfl:  .  polyethylene glycol (MIRALAX / GLYCOLAX) packet, Take 17 g by mouth daily., Disp: 14 each, Rfl: 0 .  ranitidine (ZANTAC) 75 MG tablet, Take 75 mg by mouth 2 (two) times daily as needed for heartburn. , Disp: , Rfl:  .  senna (SENOKOT) 8.6 MG TABS tablet, Take 2 tablets by mouth 2 (two) times  daily., Disp: , Rfl:  .  UNABLE TO FIND, Diet Type: Regular, Disp: , Rfl:   EXAM:  GENERAL: alert, oriented at his baseline, sounds well and in no acute distress  LUNGS: no signs of respiratory distress, breathing rate appears normal, no obvious gross SOB, gasping or wheezing  PSYCH/NEURO: pleasant and cooperative, speech grossly intact  ASSESSMENT AND PLAN:  Discussed the following assessment and plan:  1. Edema leg Patient will use 10 mg of Lasix as needed on days where he has some more swelling in the feet.  Advised patient and wife to try elevating the feet up in a lazy boy and then putting a pillow up under her feet first to help reduce swelling prior to using the Lasix tablet.  Also encouraged patient to continue to get up and walk throughout the day to keep himself active.  Advised that if swelling worsens in any way, to call office to let us know and we will most likely request patient come into clinic for an in person visit.  - furosemide (LASIX) 20 MG tablet; Take 0.5 tablets (10 mg total) by mouth daily as needed for fluid or edema.  Dispense: 30 tablet; Refill: 0    I discussed the assessment and treatment plan with the patient. The patient was provided an opportunity to ask questions and all were answered. The patient agreed with the plan and demonstrated an understanding of the instructions.   The patient was advised to call back or seek an in-person evaluation if the symptoms worsen or if the condition fails to improve as anticipated.  I provided 12 minutes of non-face-to-face time during this encounter.   Jodelle Green, FNP

## 2019-02-10 ENCOUNTER — Other Ambulatory Visit: Payer: Self-pay | Admitting: Internal Medicine

## 2019-02-28 IMAGING — DX DG ANKLE COMPLETE 3+V*R*
4 series · 4 of 4 positions shown · non-contrast
Comparison: None.

CLINICAL DATA: Right ankle swelling weakness

EXAM:
RIGHT ANKLE - COMPLETE 3+ VIEW

[ankle ap]
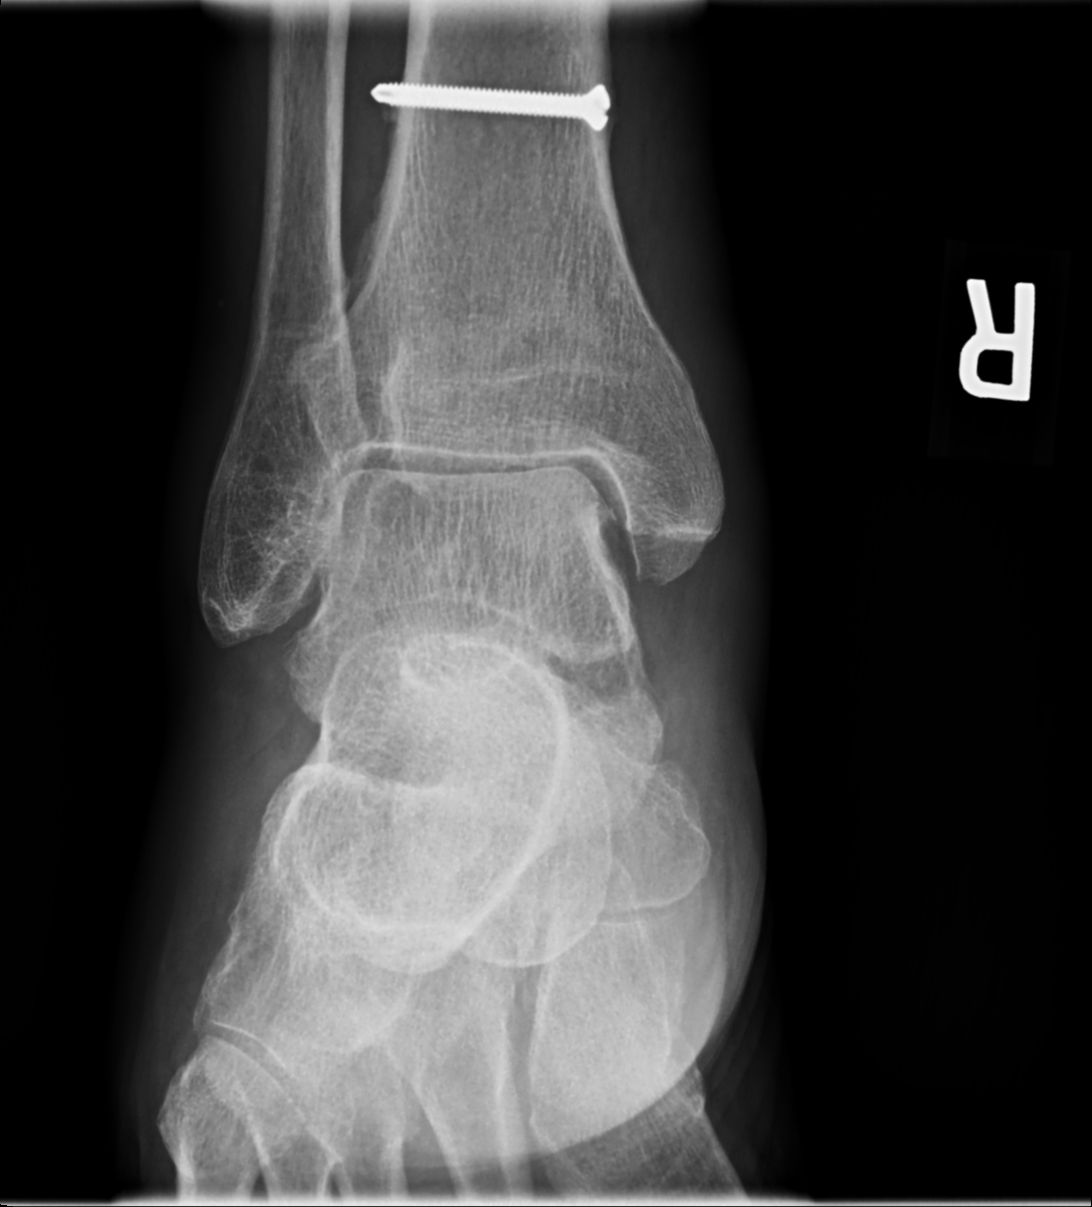

[ankle obl (oblique) (1 of 2)]
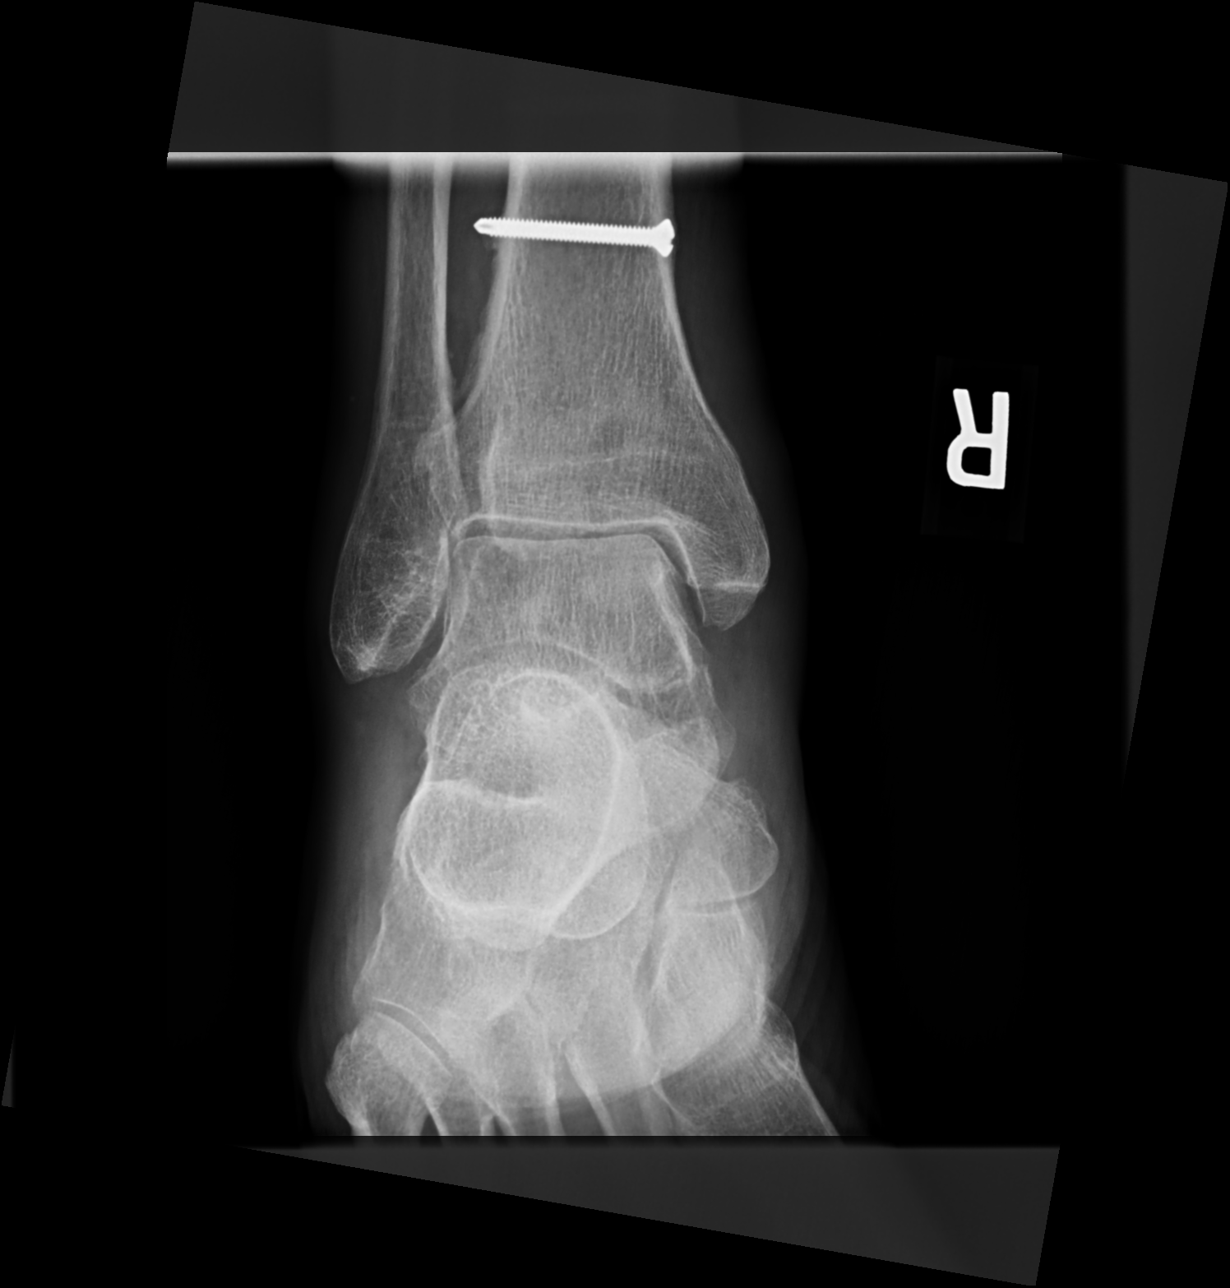

[ankle obl (oblique) (2 of 2)]
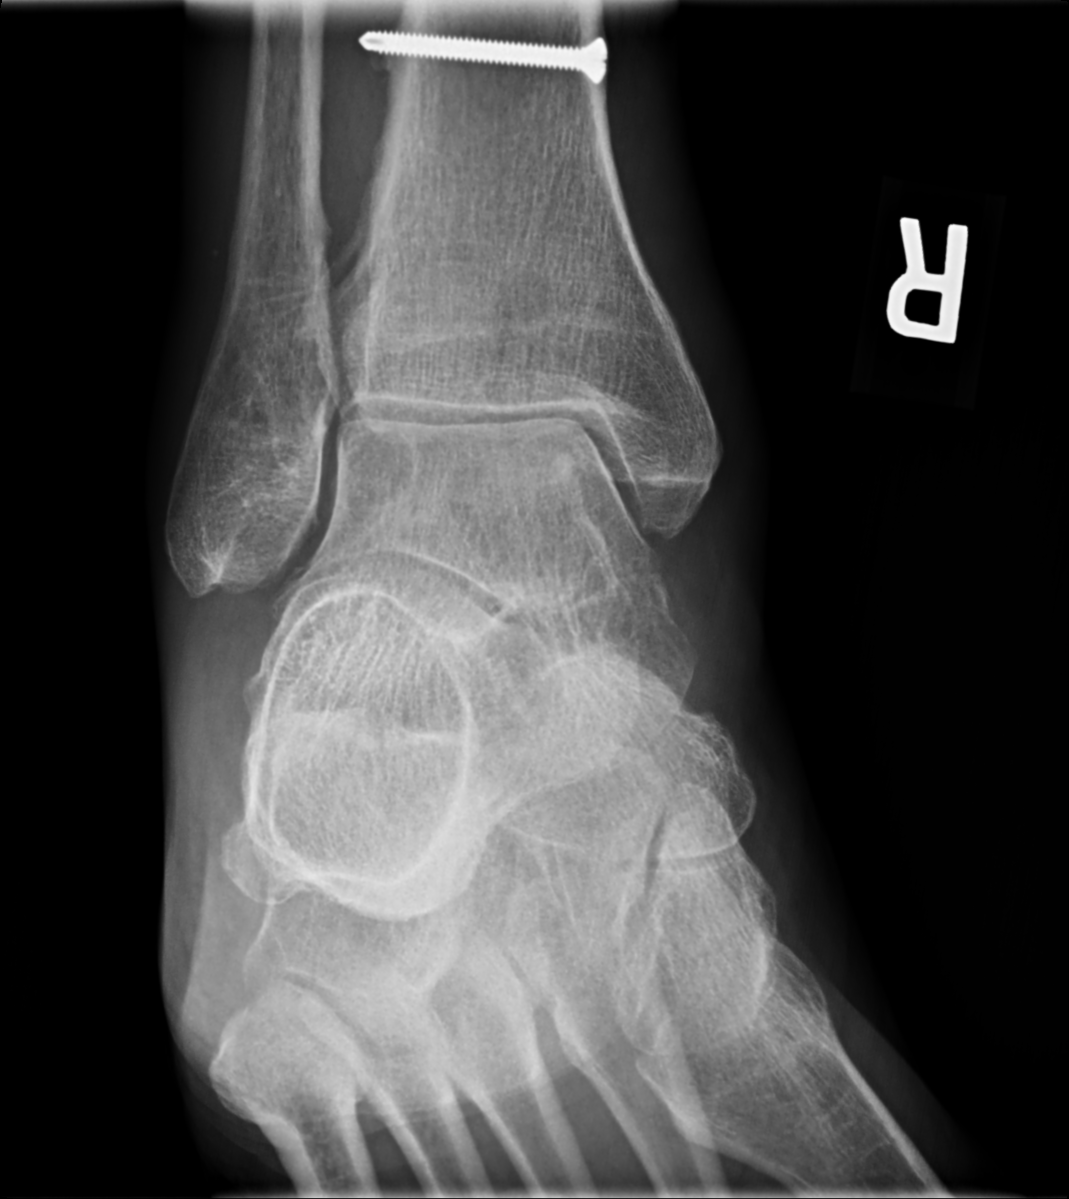

[ankle lat]
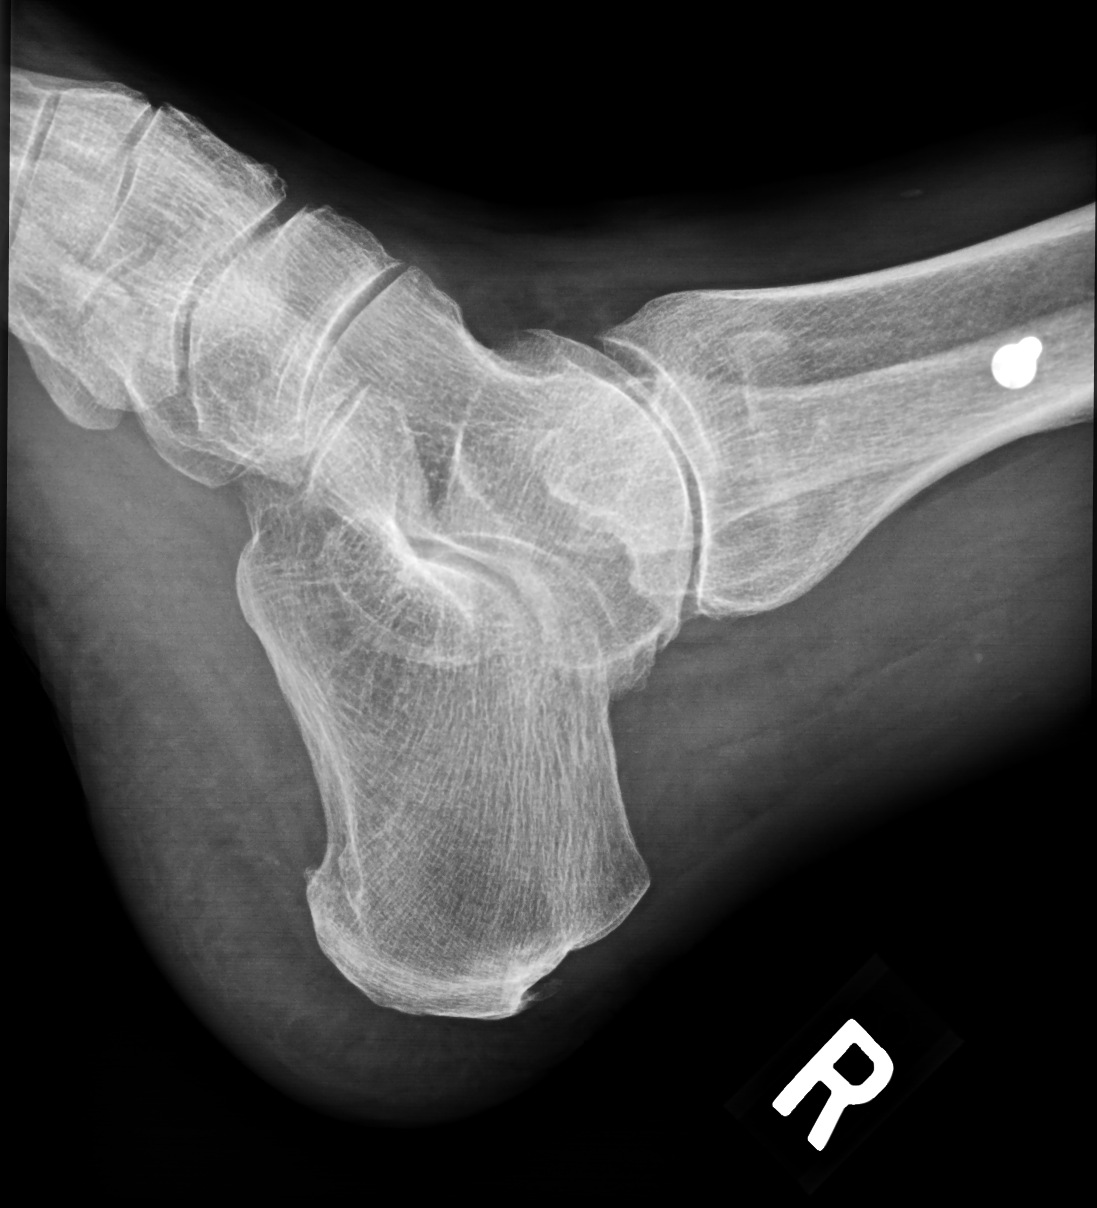

[4 of 4 positions shown; findings below may reference images not displayed]

FINDINGS: No acute fracture. No dislocation. Unremarkable soft tissues. There
is a metal screw in the distal tibia. Mild degenerative change in
the ankle joint.
IMPRESSION: No acute bony pathology.

## 2019-05-25 ENCOUNTER — Ambulatory Visit (INDEPENDENT_AMBULATORY_CARE_PROVIDER_SITE_OTHER): Payer: Medicare Other | Admitting: Internal Medicine

## 2019-05-25 ENCOUNTER — Other Ambulatory Visit: Payer: Self-pay

## 2019-05-25 ENCOUNTER — Encounter: Payer: Self-pay | Admitting: Internal Medicine

## 2019-05-25 VITALS — Ht 66.0 in | Wt 175.0 lb

## 2019-05-25 DIAGNOSIS — M7989 Other specified soft tissue disorders: Secondary | ICD-10-CM

## 2019-05-25 DIAGNOSIS — R600 Localized edema: Secondary | ICD-10-CM

## 2019-05-25 DIAGNOSIS — D509 Iron deficiency anemia, unspecified: Secondary | ICD-10-CM

## 2019-05-25 DIAGNOSIS — R7301 Impaired fasting glucose: Secondary | ICD-10-CM

## 2019-05-25 DIAGNOSIS — R4189 Other symptoms and signs involving cognitive functions and awareness: Secondary | ICD-10-CM

## 2019-05-25 DIAGNOSIS — Z7409 Other reduced mobility: Secondary | ICD-10-CM

## 2019-05-25 NOTE — Assessment & Plan Note (Signed)
MRI,  Labs ordered to rule out reversible causes of dementia  Neurology referral made,  [facility] assessment for PT/OT/nursing adi  Made  Goals of care discussed with wife.

## 2019-05-25 NOTE — Progress Notes (Signed)
Telephone  Note  This visit type was conducted due to national recommendations for restrictions regarding the COVID-19 pandemic (e.g. social distancing).  This format is felt to be most appropriate for this patient at this time.  All issues noted in this document were discussed and addressed.  No physical exam was performed (except for noted visual exam findings with Video Visits).   I connected with@ on 05/25/19 at  8:00 AM EST by  telephone and verified that I am speaking with the correct person using two identifiers. Location patient: home Location provider: work or home office Persons participating in the virtual visit: patient, provider  I discussed the limitations, risks, security and privacy concerns of performing an evaluation and management service by telephone and the availability of in person appointments. I also discussed with the patient that there may be a patient responsible charge related to this service. The patient expressed understanding and agreed to proceed.  Reason for visit: follow up   HPI:  86 yr old male presents with cognitive decline and decreased attention to hygiene,,  Decreased ambulation.   History provided by wife.    Saw a neurologist and had a CT scan over a year ago at the [facility] hospital,  Wife not happy with care and  Not clear about diagnosis.  Patient increasingly reluctant to bathe,  Brush teeth etc,  won't go for walks .  Repeating himself a lot .  Edema:  Sing lasix 10 mg prn edema until recently ,  Now daily.  Denies orthopnea    ROS: See pertinent positives and negatives per HPI.  Past Medical History:  Diagnosis Date  . Anxiety   . Arthritis   . BPH (benign prostatic hypertrophy)   . DVT (deep vein thrombosis) in pregnancy   . Presbyacusis    right sided hearing aid    Past Surgical History:  Procedure Laterality Date  . EYE SURGERY     cataract surgery  . HERNIA REPAIR    . INTRAMEDULLARY (IM) NAIL INTERTROCHANTERIC Right 11/14/2017   Procedure: INTRAMEDULLARY (IM) NAIL INTERTROCHANTRIC;  Surgeon: Leim Fabry, MD;  Location: [facility] ORS;  Service: Orthopedics;  Laterality: Right;  . JOINT REPLACEMENT  1953   ankle crush injury, right  . PROSTATE SURGERY     biopsy   . TONSILLECTOMY AND ADENOIDECTOMY  1936  . TOTAL HIP ARTHROPLASTY  2007   left , Dr Cay Schillings    Family History  Problem Relation Age of Onset  . Arthritis Mother   . Kidney cancer Neg Hx   . Kidney disease Neg Hx   . Prostate cancer Neg Hx     SOCIAL HX:  reports that he quit smoking about 65 years ago. He has never used smokeless tobacco. He reports that he does not drink alcohol or use drugs.   Current Outpatient Medications:  .  donepezil (ARICEPT) 5 MG tablet, Take 5 mg by mouth at bedtime., Disp: , Rfl:  .  doxazosin (CARDURA) 2 MG tablet, TAKE 1 TABLET BY MOUTH AT  BEDTIME, Disp: 90 tablet, Rfl: 3 .  finasteride (PROSCAR) 5 MG tablet, TAKE 1 TABLET BY MOUTH  DAILY, Disp: 90 tablet, Rfl: 3 .  furosemide (LASIX) 20 MG tablet, Take 0.5 tablets (10 mg total) by mouth daily as needed for fluid or edema., Disp: 30 tablet, Rfl: 0 .  Lactulose 20 GM/30ML SOLN, 30 ml every 4 hours until constipation is relieved., Disp: 236 mL, Rfl: 1 .  Multiple Vitamins-Minerals (CEROVITE PO), Take 1  tablet by mouth daily., Disp: , Rfl:  .  polyethylene glycol (MIRALAX / GLYCOLAX) packet, Take 17 g by mouth daily., Disp: 14 each, Rfl: 0 .  ranitidine (ZANTAC) 75 MG tablet, Take 75 mg by mouth 2 (two) times daily as needed for heartburn. , Disp: , Rfl:  .  senna (SENOKOT) 8.6 MG TABS tablet, Take 2 tablets by mouth 2 (two) times daily., Disp: , Rfl:  .  UNABLE TO FIND, Diet Type: Regular, Disp: , Rfl:   EXAM:  General appearance: alert, cooperative and articulate.  No signs of being in distress  Lungs: not short of breath ,  No cough, speaking in full sentences  Psych: affect normal, sentence length is short  and non pressured .   Neuro:  awake and interactive . Unable  to assess due to hard of hearing    ASSESSMENT AND PLAN:  Discussed the following assessment and plan:  Iron deficiency anemia, unspecified iron deficiency anemia type - Plan: CBC with Differential/Platelet, Iron, TIBC and Ferritin Panel  Right leg swelling  Bilateral lower extremity edema - Plan: CANCELED: TSH  Impaired fasting glucose - Plan: Comprehensive metabolic panel, Hemoglobin A1c  Cognitive decline - Plan: RPR, Vitamin B12, TSH, MR Brain Wo Contrast, Ambulatory referral to Neurology, Ambulatory referral to Home Health  Decreased ambulation status - Plan: Ambulatory referral to [place]  Cognitive decline MRI,  Labs ordered to rule out reversible causes of dementia  Neurology referral made,  [facility] assessment for PT/OT/nursing adi  Made  Goals of care discussed with wife.   Bilateral lower extremity edema Using prn lasix,  Now daily.  Labs needed,  No signs of heart failure,  likely VI due to sedentary status,  Prior right hip replacement.     I discussed the assessment and treatment plan with the patient. The patient was provided an opportunity to ask questions and all were answered. The patient agreed with the plan and demonstrated an understanding of the instructions.   The patient was advised to call back or seek an in-person evaluation if the symptoms worsen or if the condition fails to improve as anticipated.  I provided  25 minutes of non-face-to-face time during this encounter reviewing patient's current problems and past procedures/imaging studies, providing counseling on the above mentioned problems , and coordination  of care . Crecencio Mc, MD

## 2019-05-25 NOTE — Assessment & Plan Note (Signed)
Using prn lasix,  Now daily.  Labs needed,  No signs of heart failure,  likely VI due to sedentary status,  Prior right hip replacement.

## 2019-05-26 ENCOUNTER — Other Ambulatory Visit (INDEPENDENT_AMBULATORY_CARE_PROVIDER_SITE_OTHER): Payer: Medicare Other

## 2019-05-26 DIAGNOSIS — R4189 Other symptoms and signs involving cognitive functions and awareness: Secondary | ICD-10-CM

## 2019-05-26 DIAGNOSIS — D509 Iron deficiency anemia, unspecified: Secondary | ICD-10-CM

## 2019-05-26 DIAGNOSIS — R7301 Impaired fasting glucose: Secondary | ICD-10-CM

## 2019-05-26 LAB — VITAMIN B12: Vitamin B-12: 541 pg/mL (ref 211–911)

## 2019-05-26 LAB — HEMOGLOBIN A1C: Hgb A1c MFr Bld: 5.4 % (ref 4.6–6.5)

## 2019-05-26 LAB — COMPREHENSIVE METABOLIC PANEL
ALT: 13 U/L (ref 0–53)
AST: 15 U/L (ref 0–37)
Albumin: 3.9 g/dL (ref 3.5–5.2)
Alkaline Phosphatase: 78 U/L (ref 39–117)
BUN: 31 mg/dL — ABNORMAL HIGH (ref 6–23)
CO2: 25 mEq/L (ref 19–32)
Calcium: 8.7 mg/dL (ref 8.4–10.5)
Chloride: 112 mEq/L (ref 96–112)
Creatinine, Ser: 1.2 mg/dL (ref 0.40–1.50)
GFR: 56.88 mL/min — ABNORMAL LOW (ref >60.00)
Glucose, Bld: 93 mg/dL (ref 70–99)
Potassium: 3.8 mEq/L (ref 3.5–5.1)
Sodium: 145 mEq/L (ref 135–145)
Total Bilirubin: 1.2 mg/dL (ref 0.2–1.2)
Total Protein: 7.1 g/dL (ref 6.0–8.3)

## 2019-05-26 LAB — CBC WITH DIFFERENTIAL/PLATELET
Basophils Absolute: 0.1 10*3/uL (ref 0.0–0.1)
Basophils Relative: 0.7 % (ref 0.0–3.0)
Eosinophils Absolute: 0.3 10*3/uL (ref 0.0–0.7)
Eosinophils Relative: 3 % (ref 0.0–5.0)
HCT: 42.9 % (ref 39.0–52.0)
Hemoglobin: 13.9 g/dL (ref 13.0–17.0)
Lymphocytes Relative: 32 % (ref 12.0–46.0)
Lymphs Abs: 3.2 10*3/uL (ref 0.7–4.0)
MCHC: 32.4 g/dL (ref 30.0–36.0)
MCV: 89.1 fl (ref 78.0–100.0)
Monocytes Absolute: 0.7 10*3/uL (ref 0.1–1.0)
Monocytes Relative: 6.9 % (ref 3.0–12.0)
Neutro Abs: 5.7 10*3/uL (ref 1.4–7.7)
Neutrophils Relative %: 57.4 % (ref 43.0–77.0)
Platelets: 275 10*3/uL (ref 150.0–400.0)
RBC: 4.82 Mil/uL (ref 4.22–5.81)
RDW: 14.1 % (ref 11.5–15.5)
WBC: 10 10*3/uL (ref 4.0–10.5)

## 2019-05-26 LAB — TSH: TSH: 5.22 u[IU]/mL — ABNORMAL HIGH (ref 0.35–4.50)

## 2019-05-27 LAB — IRON,TIBC AND FERRITIN PANEL
%SAT: 36 % (calc) (ref 20–48)
Ferritin: 23 ng/mL — ABNORMAL LOW (ref 24–380)
Iron: 108 ug/dL (ref 50–180)
TIBC: 297 mcg/dL (calc) (ref 250–425)

## 2019-05-27 LAB — RPR: RPR Ser Ql: NONREACTIVE

## 2019-05-28 ENCOUNTER — Other Ambulatory Visit: Payer: Self-pay | Admitting: Internal Medicine

## 2019-05-28 DIAGNOSIS — E039 Hypothyroidism, unspecified: Secondary | ICD-10-CM

## 2019-05-28 MED ORDER — LEVOTHYROXINE SODIUM 50 MCG PO TABS: 50.0000 ug | ORAL_TABLET | Freq: Every day | ORAL | 3 refills | Status: DC

## 2019-06-04 ENCOUNTER — Ambulatory Visit: Payer: Medicare Other | Admitting: Internal Medicine

## 2019-06-06 ENCOUNTER — Other Ambulatory Visit: Payer: Self-pay

## 2019-06-06 ENCOUNTER — Ambulatory Visit
Admission: RE | Admit: 2019-06-06 | Discharge: 2019-06-06 | Disposition: A | Discharge status: Home / Self Care | Payer: Medicare Other | Source: Ambulatory Visit | Attending: Internal Medicine | Admitting: Internal Medicine

## 2019-06-06 DIAGNOSIS — R4189 Other symptoms and signs involving cognitive functions and awareness: Secondary | ICD-10-CM | POA: Insufficient documentation

## 2019-06-06 DIAGNOSIS — S0990XA Unspecified injury of head, initial encounter: Secondary | ICD-10-CM | POA: Diagnosis not present

## 2019-06-07 ENCOUNTER — Ambulatory Visit: Payer: Medicare Other | Attending: Internal Medicine

## 2019-06-07 DIAGNOSIS — Z23 Encounter for immunization: Secondary | ICD-10-CM | POA: Insufficient documentation

## 2019-06-07 NOTE — Progress Notes (Signed)
   Covid-19 Vaccination Clinic  Name:  BRYLYN MABERY    MRN: [id number] DOB: February 16, 1928  06/07/2019  Mr. Mercedes was observed post Covid-19 immunization for 15 minutes without incidence. He was provided with Vaccine Information Sheet and instruction to access the V-Safe system.   Mr. Zickefoose was instructed to call 911 with any severe reactions post vaccine: Marland Kitchen Difficulty breathing  . Swelling of your face and throat  . A fast heartbeat  . A bad rash all over your body  . Dizziness and weakness    Immunizations Administered    Name Date Dose VIS Date Route   Pfizer COVID-19 Vaccine 06/07/2019  9:27 AM 0.3 mL 03/27/2019 Intramuscular   Manufacturer: [place]   Lot: [id number]   [place]: [id number]

## 2019-06-09 ENCOUNTER — Ambulatory Visit: Payer: Medicare Other | Admitting: Internal Medicine

## 2019-06-09 ENCOUNTER — Other Ambulatory Visit: Payer: Self-pay

## 2019-06-12 ENCOUNTER — Ambulatory Visit (INDEPENDENT_AMBULATORY_CARE_PROVIDER_SITE_OTHER): Payer: Medicare Other | Admitting: Internal Medicine

## 2019-06-12 ENCOUNTER — Other Ambulatory Visit: Payer: Self-pay

## 2019-06-12 ENCOUNTER — Encounter: Payer: Self-pay | Admitting: Internal Medicine

## 2019-06-12 DIAGNOSIS — R600 Localized edema: Secondary | ICD-10-CM

## 2019-06-12 DIAGNOSIS — R4189 Other symptoms and signs involving cognitive functions and awareness: Secondary | ICD-10-CM | POA: Diagnosis not present

## 2019-06-12 DIAGNOSIS — S72001S Fracture of unspecified part of neck of right femur, sequela: Secondary | ICD-10-CM | POA: Diagnosis not present

## 2019-06-12 DIAGNOSIS — I10 Essential (primary) hypertension: Secondary | ICD-10-CM

## 2019-06-12 MED ORDER — POLYETHYLENE GLYCOL 3350 17 G PO PACK: 17.0000 g | PACK | Freq: Every day | ORAL | 0 refills | Status: DC | PRN

## 2019-06-12 NOTE — Patient Instructions (Signed)
I agree that it is time to give up driving the car.  I want you start taking your blood pressure and thyroid medications again.  We do not want you to have a stroke,  And without your thyroid medication you will have NO ENERGY AND DULL THINKING   CONTINUE THE FLUID PILL DAILY

## 2019-06-12 NOTE — Progress Notes (Signed)
Subjective:  Patient ID: Corey Todd, male    DOB: August 11, 1927  Age: 86 y.o. MRN: [id number]  CC: Diagnoses of Bilateral lower extremity edema, Closed right hip fracture, sequela, Cognitive decline, and Essential hypertension were pertinent to this visit.  HPI Corey Todd presents for follow up on dementia, hypertension and muscular deconditioning.  This visit occurred during the SARS-CoV-2 public health emergency.  Safety protocols were in place, including screening questions prior to the visit, additional usage of staff PPE, and extensive cleaning of exam room while observing appropriate contact time as indicated for disinfecting solutions.   Dementia:  MRI  Brain done since last visit : chronic ischemic changes and atrophy c/w vascular dementia diagnosis.  Per wife he is becoming more forgetful. .  Left the shower running for hours recently . Wife was in the house but unaware he had showered.   He also has been choosing towels and washcloths from the dirty laundry .  His d/l has expired.  He remains interested in  driving but was agreeable to stopping.  He sleeps most of the day .  Using a cane since he had a hip fracture 1.5 years ago.   Outpatient Medications Prior to Visit  Medication Sig Dispense Refill  . doxazosin (CARDURA) 2 MG tablet TAKE 1 TABLET BY MOUTH AT  BEDTIME (Patient not taking: Reported on 06/12/2019) 90 tablet 3  . finasteride (PROSCAR) 5 MG tablet TAKE 1 TABLET BY MOUTH  DAILY (Patient not taking: Reported on 06/12/2019) 90 tablet 3  . furosemide (LASIX) 20 MG tablet Take 0.5 tablets (10 mg total) by mouth daily as needed for fluid or edema. (Patient not taking: Reported on 06/12/2019) 30 tablet 0  . Lactulose 20 GM/30ML SOLN 30 ml every 4 hours until constipation is relieved. (Patient not taking: Reported on 06/12/2019) 236 mL 1  . levothyroxine (SYNTHROID) 50 MCG tablet Take 1 tablet (50 mcg total) by mouth daily. (Patient not taking: Reported on 06/12/2019) 90  tablet 3  . Multiple Vitamins-Minerals (CEROVITE PO) Take 1 tablet by mouth daily.    Marland Kitchen UNABLE TO FIND Diet Type: Regular    . donepezil (ARICEPT) 5 MG tablet Take 5 mg by mouth at bedtime.    . polyethylene glycol (MIRALAX / GLYCOLAX) packet Take 17 g by mouth daily. (Patient not taking: Reported on 06/12/2019) 14 each 0  . ranitidine (ZANTAC) 75 MG tablet Take 75 mg by mouth 2 (two) times daily as needed for heartburn.     . senna (SENOKOT) 8.6 MG TABS tablet Take 2 tablets by mouth 2 (two) times daily.     No facility-administered medications prior to visit.    Review of Systems;  Patient denies headache, fevers, malaise, unintentional weight loss, skin rash, eye pain, sinus congestion and sinus pain, sore throat, dysphagia,  hemoptysis , cough, dyspnea, wheezing, chest pain, palpitations, orthopnea, edema, abdominal pain, nausea, melena, diarrhea, constipation, flank pain, dysuria, hematuria, urinary  Frequency, nocturia, numbness, tingling, seizures,  Focal weakness, Loss of consciousness,  Tremor, insomnia, depression, anxiety, and suicidal ideation.      Objective:  BP (!) 158/78 (BP Location: Left Arm, Patient Position: Sitting, Cuff Size: Normal)   Pulse 79   Temp 98.1 F (36.7 C) (Temporal)   Resp 15   Ht 5\' 6"  (1.676 m)   Wt 196 lb 6.4 oz (89.1 kg)   SpO2 96%   BMI 31.70 kg/m   BP Readings from Last 3 Encounters:  06/12/19 (!) 158/78  09/26/18 (!) 122/56  04/18/18 120/60    Wt Readings from Last 3 Encounters:  06/12/19 196 lb 6.4 oz (89.1 kg)  05/25/19 175 lb (79.4 kg)  01/13/19 175 lb (79.4 kg)    General appearance: alert, cooperative, pleasant ,  Well groomed,  and appears stated age Ears: normal TM's and external ear canals both ears Throat: lips, mucosa, and tongue normal; teeth and gums normal Neck: no adenopathy, no carotid bruit, supple, symmetrical, trachea midline and thyroid not enlarged, symmetric, no tenderness/mass/nodules Back: symmetric, no  curvature. ROM normal. No CVA tenderness. Lungs: clear to auscultation bilaterally Heart: regular rate and rhythm, S1, S2 normal, no murmur, click, rub or gallop Abdomen: soft, non-tender; bowel sounds normal; no masses,  no organomegaly Pulses: 2+ and symmetric Skin: Skin color, texture, turgor normal. No rashes or lesions Lymph nodes: Cervical, supraclavicular, and axillary nodes normal.  Lab Results  Component Value Date   HGBA1C 5.4 05/26/2019   HGBA1C 5.6 10/16/2017   HGBA1C 5.4 12/26/2015    Lab Results  Component Value Date   CREATININE 1.20 05/26/2019   CREATININE 1.13 10/08/2018   CREATININE 0.98 01/13/2018    Lab Results  Component Value Date   WBC 10.0 05/26/2019   HGB 13.9 05/26/2019   HCT 42.9 05/26/2019   PLT 275.0 05/26/2019   GLUCOSE 93 05/26/2019   CHOL 146 11/21/2012   TRIG 108.0 11/21/2012   HDL 48.80 11/21/2012   LDLCALC 76 11/21/2012   ALT 13 05/26/2019   AST 15 05/26/2019   NA 145 05/26/2019   K 3.8 05/26/2019   CL 112 05/26/2019   CREATININE 1.20 05/26/2019   BUN 31 (H) 05/26/2019   CO2 25 05/26/2019   TSH 5.22 (H) 05/26/2019   INR 1.02 11/14/2017   HGBA1C 5.4 05/26/2019   MICROALBUR <0.7 06/26/2016    MR Brain Wo Contrast  Result Date: 06/06/2019 CLINICAL DATA:  Subacute head trauma with progressive cognitive decline. EXAM: MRI HEAD WITHOUT CONTRAST TECHNIQUE: Multiplanar, multiecho pulse sequences of the brain and surrounding structures were obtained without intravenous contrast. COMPARISON:  None. FINDINGS: BRAIN: No acute infarct, acute hemorrhage or extra-axial collection. Multifocal white matter hyperintensity, most commonly due to chronic ischemic microangiopathy. There is generalized atrophy without lobar predilection. Midline structures are normal. VASCULAR: Major flow voids are preserved. Susceptibility-sensitive sequences show no chronic microhemorrhage or superficial siderosis. SKULL AND UPPER CERVICAL SPINE: Normal calvarium and  skull base. Visualized upper cervical spine and soft tissues are normal. SINUSES/ORBITS: No fluid levels or advanced mucosal thickening. No mastoid or middle ear effusion. Normal orbits. IMPRESSION: Generalized atrophy and findings of chronic small vessel disease without acute intracranial abnormality. Electronically Signed   By: Ulyses Jarred M.D.   On: 06/06/2019 21:07    Assessment & Plan:   Problem List Items Addressed This Visit      Unprioritized   Bilateral lower extremity edema    Continue furosemide once daily given inability to use compression stockings  And use of amlodipine      Closed right hip fracture, sequela    He remains with unsteady gait and uses a cane with ambulation       Cognitive decline    He has early vascular dementia.  He has stopped taking his medications . Discussed need for more supervision with wife.  Reviewed medications and simplified his list       Hypertension    Currently not at goal  without metoprolol ,which he has not taken in several months apparently.  Asked  Patient to check pressure twice weekly at home and submit readings          I have discontinued Marca Ancona. Slager's ranitidine, polyethylene glycol, senna, donepezil, and polyethylene glycol. I am also having him maintain his Multiple Vitamins-Minerals (CEROVITE PO), UNABLE TO FIND, Lactulose, furosemide, finasteride, doxazosin, and levothyroxine.  Meds ordered this encounter  Medications  . DISCONTD: polyethylene glycol (MIRALAX / GLYCOLAX) 17 g packet    Sig: Take 17 g by mouth daily as needed. FOR CONSTIPATION    Dispense:  100 each    Refill:  0    Medications Discontinued During This Encounter  Medication Reason  . polyethylene glycol (MIRALAX / GLYCOLAX) packet   . donepezil (ARICEPT) 5 MG tablet   . polyethylene glycol (MIRALAX / GLYCOLAX) 17 g packet   . ranitidine (ZANTAC) 75 MG tablet   . senna (SENOKOT) 8.6 MG TABS tablet     I provided  30 minutes  Of  face-to-face time during this encounter reviewing patient's current problems and past surgeries, labs and imaging studies, providing counseling on the above mentioned problems , and coordination  of care . Follow-up: No follow-ups on file.   Crecencio Mc, MD

## 2019-06-14 NOTE — Assessment & Plan Note (Signed)
He remains with unsteady gait and uses a cane with ambulation

## 2019-06-14 NOTE — Assessment & Plan Note (Signed)
Continue furosemide once daily given inability to use compression stockings  And use of amlodipine

## 2019-06-14 NOTE — Assessment & Plan Note (Signed)
Currently not at goal  without metoprolol ,which he has not taken in several months apparently. Asked  Patient to check pressure twice weekly at home and submit readings

## 2019-06-14 NOTE — Assessment & Plan Note (Addendum)
He has early vascular dementia.  He has stopped taking his medications  And has become very forgetful.  Recommended that he stop driving.  Discussed need for more supervision with wife.  Reviewed medications and simplified his list

## 2019-06-26 ENCOUNTER — Telehealth: Payer: Self-pay | Admitting: Internal Medicine

## 2019-06-26 DIAGNOSIS — R600 Localized edema: Secondary | ICD-10-CM

## 2019-06-26 MED ORDER — FUROSEMIDE 20 MG PO TABS: 10.0000 mg | ORAL_TABLET | Freq: Every day | ORAL | 1 refills | Status: DC | PRN

## 2019-06-26 NOTE — Telephone Encounter (Signed)
[facility]ide [facility]

## 2019-06-26 NOTE — Telephone Encounter (Signed)
Pt said he never received Lasix prescription. He saw Dr. [name] on 06/12/19 and it was supposed to go to [facility] but they said can it just be sent to [facility] this time.

## 2019-06-26 NOTE — Telephone Encounter (Signed)
According to chart patient is no longer taking please advise.

## 2019-07-01 ENCOUNTER — Ambulatory Visit: Payer: Medicare Other | Attending: Internal Medicine

## 2019-07-01 DIAGNOSIS — Z23 Encounter for immunization: Secondary | ICD-10-CM

## 2019-07-01 NOTE — Progress Notes (Signed)
   Covid-19 Vaccination Clinic  Name:  Corey Todd    MRN: [id number] DOB: 02-Sep-1927  07/01/2019  [name] was observed post Covid-19 immunization for 15 minutes without incident. He was provided with Vaccine Information Sheet and instruction to access the V-Safe system.   [name] was instructed to call 911 with any severe reactions post vaccine: Marland Kitchen Difficulty breathing  . Swelling of face and throat  . A fast heartbeat  . A bad rash all over body  . Dizziness and weakness   Immunizations Administered    Name Date Dose VIS Date Route   Pfizer COVID-19 Vaccine 07/01/2019  9:12 AM 0.3 mL 03/27/2019 Intramuscular   Manufacturer: [place]   Lot: [id number]   [place]: [id number]

## 2019-07-16 ENCOUNTER — Other Ambulatory Visit: Payer: Self-pay | Admitting: Internal Medicine

## 2019-07-21 ENCOUNTER — Telehealth: Payer: Self-pay | Admitting: Internal Medicine

## 2019-07-21 NOTE — Telephone Encounter (Signed)
Santiago Glad with [facility] called wanting to know if the new order had been put in for pt

## 2019-07-22 ENCOUNTER — Other Ambulatory Visit: Payer: Self-pay | Admitting: Internal Medicine

## 2019-07-22 DIAGNOSIS — R29898 Other symptoms and signs involving the musculoskeletal system: Secondary | ICD-10-CM

## 2019-07-22 DIAGNOSIS — R4189 Other symptoms and signs involving cognitive functions and awareness: Secondary | ICD-10-CM

## 2019-07-22 DIAGNOSIS — Z7409 Other reduced mobility: Secondary | ICD-10-CM

## 2019-07-27 ENCOUNTER — Telehealth: Payer: Self-pay | Admitting: Internal Medicine

## 2019-07-27 NOTE — Telephone Encounter (Signed)
[facility] called stating pt refused home health the pt wants [facility] not home health. Please advise and Thank you!

## 2019-07-28 NOTE — Telephone Encounter (Signed)
Spoke with pt's wife and she stated that she was not aware that the pt refused the home health services. She stated that she has a number to call them back so she stated that she would give them a call today to see if she can go ahead and get something set up with them. Pt was advised that if she needed anything please give [name] a call back.

## 2019-07-28 NOTE — Telephone Encounter (Signed)
Please speak to Corey Todd and find out if she is aware that her husband ,  Who has dementia,  Refused Home health services when they called.

## 2019-08-18 ENCOUNTER — Telehealth: Payer: Self-pay | Admitting: Internal Medicine

## 2019-08-18 DIAGNOSIS — R609 Edema, unspecified: Secondary | ICD-10-CM

## 2019-08-18 DIAGNOSIS — R600 Localized edema: Secondary | ICD-10-CM

## 2019-08-18 NOTE — Telephone Encounter (Signed)
Pt wife called she has been giving him the lasix 20mg  and not breaking the tablet like the rx says because his feet are swelling and they now need a refill on the lasix sent to [facility] also she wants to know if it was ok to increase

## 2019-08-19 MED ORDER — FUROSEMIDE 20 MG PO TABS: 10.0000 mg | ORAL_TABLET | Freq: Every day | ORAL | 0 refills | Status: DC | PRN

## 2019-08-19 NOTE — Addendum Note (Signed)
Addended by: Crecencio Mc on: 08/19/2019 05:11 PM   Modules accepted: Orders

## 2019-08-19 NOTE — Telephone Encounter (Signed)
I cannot tell her that without checking a BMET because it  may have affected his kidney function ,  Can she bring him in for lab visit?  I have refilled [facility]   Please tell her to reduce dose to  1/2 tab

## 2019-08-20 NOTE — Telephone Encounter (Signed)
[name] called and advised on below. She will bring patient tomorrow afternoon to check BMET. I let her know that lasix was refilled, but please only give him HALF the tablet. She said that she would & had just been giving patient the whole because his feet had been so swollen.

## 2019-08-21 ENCOUNTER — Other Ambulatory Visit: Payer: Medicare Other

## 2019-08-24 ENCOUNTER — Other Ambulatory Visit (INDEPENDENT_AMBULATORY_CARE_PROVIDER_SITE_OTHER): Payer: Medicare Other

## 2019-08-24 ENCOUNTER — Other Ambulatory Visit: Payer: Self-pay

## 2019-08-24 DIAGNOSIS — E039 Hypothyroidism, unspecified: Secondary | ICD-10-CM | POA: Diagnosis not present

## 2019-08-24 DIAGNOSIS — R609 Edema, unspecified: Secondary | ICD-10-CM

## 2019-08-24 LAB — TSH: TSH: 3.59 u[IU]/mL (ref 0.35–4.50)

## 2019-08-24 LAB — BASIC METABOLIC PANEL
BUN: 17 mg/dL (ref 6–23)
CO2: 27 mEq/L (ref 19–32)
Calcium: 8.8 mg/dL (ref 8.4–10.5)
Chloride: 105 mEq/L (ref 96–112)
Creatinine, Ser: 1.2 mg/dL (ref 0.40–1.50)
GFR: 56.85 mL/min — ABNORMAL LOW (ref >60.00)
Glucose, Bld: 102 mg/dL — ABNORMAL HIGH (ref 70–99)
Potassium: 4.1 mEq/L (ref 3.5–5.1)
Sodium: 139 mEq/L (ref 135–145)

## 2019-09-07 ENCOUNTER — Telehealth: Payer: Self-pay | Admitting: Internal Medicine

## 2019-09-07 NOTE — Telephone Encounter (Signed)
Attempted to call pt. No answer no voicemail.  

## 2019-09-07 NOTE — Telephone Encounter (Signed)
FYI

## 2019-09-07 NOTE — Telephone Encounter (Signed)
I'll see him tomorrow at 12:30 pm

## 2019-09-07 NOTE — Telephone Encounter (Signed)
Pt's wife called in and said pt has swelling in both legs with redness. There is no pain and this has been going on for sometime now. I asked could I transfer her over to triage nurse and she said she wasn't in town today and didn't want me to transfer phone or an appointment today. She wanted pt to be seen tomorrow. I let her know that Dr. [name] was full tomorrow and scheduled appointment with Dr. Caryl Bis @12pm . If Dr. [name] wants to work patient please let them know.

## 2019-09-08 ENCOUNTER — Ambulatory Visit (INDEPENDENT_AMBULATORY_CARE_PROVIDER_SITE_OTHER): Payer: Medicare Other | Admitting: Family Medicine

## 2019-09-08 ENCOUNTER — Encounter: Payer: Self-pay | Admitting: Family Medicine

## 2019-09-08 ENCOUNTER — Other Ambulatory Visit: Payer: Self-pay

## 2019-09-08 DIAGNOSIS — R600 Localized edema: Secondary | ICD-10-CM

## 2019-09-08 DIAGNOSIS — R4189 Other symptoms and signs involving cognitive functions and awareness: Secondary | ICD-10-CM

## 2019-09-08 NOTE — Progress Notes (Signed)
  Tommi Rumps, MD Phone: [phone]  Corey Todd is a 86 y.o. male who presents today for same day visit.   Chronic leg swelling: [name] notes they went to the [place] yesterday for his hearing aid appointment and his leg swelling got worse throughout the day and he developed significant redness in his legs as well.  She notes the swelling and the redness have improved significantly after propping his legs up overnight.  They have tried compression stockings in the past though he cut them off right away.  His swelling is back to his baseline.  He has taken Lasix for his swelling.  His wife reports they have been taking 20 mg daily for a long time.  It helps some.  Vascular dementia: [name] notes it is becoming increasingly more difficult for her to care for the patient.  They have an appointment with the [facility] later in June to discuss potential placement or other options for caring for the patient.  She does note he sleeps a lot and that has been going on for the last year and a half.  Recent TSH and BMP reviewed.  TSH acceptable.  Kidney function appears relatively stable.  MRI earlier this year revealed generalized atrophy and findings of chronic small vessel disease  Social History   Tobacco Use  Smoking Status Former Smoker  . Quit date: 04/16/1954  . Years since quitting: 65.4  Smokeless Tobacco Never Used     ROS see history of present illness  Objective  Physical Exam Vitals:   09/08/19 1207  BP: 120/60  Pulse: 73  Temp: (!) 97.4 F (36.3 C)  SpO2: 97%    BP Readings from Last 3 Encounters:  09/08/19 120/60  06/12/19 (!) 158/78  09/26/18 (!) 122/56   Wt Readings from Last 3 Encounters:  09/08/19 196 lb 6.4 oz (89.1 kg)  06/12/19 196 lb 6.4 oz (89.1 kg)  05/25/19 175 lb (79.4 kg)    Physical Exam Constitutional:      General: He is not in acute distress.    Appearance: He is not diaphoretic.  Pulmonary:     Effort: Pulmonary effort is normal.    Musculoskeletal:     Comments: 1+ pitting edema bilateral lower extremities with venous stasis dermatitis changes in his bilateral ankles  Skin:    General: Skin is warm and dry.  Neurological:     Mental Status: He is alert.      Assessment/Plan: Please see individual problem list.  Bilateral lower extremity edema Chronic issue.  Suspect venous insufficiency.  He can continue as needed Lasix.  Discussed propping his legs up consistently.  Advised to contact us for any persistently increased swelling or erythema.  Cognitive decline Chronic issue.  Patient with vascular dementia.  Discussed MRI findings.  Advised that they should have the [facility] send a records request so we can send them his records.   No orders of the defined types were placed in this encounter.   No orders of the defined types were placed in this encounter.   This visit occurred during the SARS-CoV-2 public health emergency.  Safety protocols were in place, including screening questions prior to the visit, additional usage of staff PPE, and extensive cleaning of exam room while observing appropriate contact time as indicated for disinfecting solutions.    Tommi Rumps, MD [place]

## 2019-09-08 NOTE — Assessment & Plan Note (Addendum)
Chronic issue.  Patient with vascular dementia.  Discussed MRI findings.  Advised that they should have the [facility] send a records request so we can send them his records.

## 2019-09-08 NOTE — Assessment & Plan Note (Addendum)
Chronic issue.  Suspect venous insufficiency.  He can continue as needed Lasix.  Discussed propping his legs up consistently.  Advised to contact us for any persistently increased swelling or erythema.

## 2019-09-08 NOTE — Patient Instructions (Signed)
Nice to see you. Please keep your legs elevated as much as possible. You can continue as needed Lasix for your swelling. Please have the [facility] send a records request to our office and we will get them the records.

## 2019-09-09 NOTE — Telephone Encounter (Signed)
Pt saw Dr. Caryl Bis yesterday.

## 2019-12-18 ENCOUNTER — Telehealth: Payer: Self-pay | Admitting: Internal Medicine

## 2019-12-18 NOTE — Telephone Encounter (Signed)
Patient is coming in to see Dr. [name] on 12/23/19 at 8:30. Patient is also wanting a letter from Dr. [name] written to the [facility] to move patient's mailbox from road to the front door.

## 2019-12-23 ENCOUNTER — Ambulatory Visit (INDEPENDENT_AMBULATORY_CARE_PROVIDER_SITE_OTHER): Payer: Medicare Other | Admitting: Internal Medicine

## 2019-12-23 ENCOUNTER — Other Ambulatory Visit: Payer: Self-pay

## 2019-12-23 ENCOUNTER — Encounter: Payer: Self-pay | Admitting: Internal Medicine

## 2019-12-23 VITALS — BP 120/66 | HR 66 | Temp 98.4°F | Resp 15 | Ht 66.0 in | Wt 194.1 lb

## 2019-12-23 DIAGNOSIS — D509 Iron deficiency anemia, unspecified: Secondary | ICD-10-CM

## 2019-12-23 DIAGNOSIS — H9111 Presbycusis, right ear: Secondary | ICD-10-CM

## 2019-12-23 DIAGNOSIS — E039 Hypothyroidism, unspecified: Secondary | ICD-10-CM | POA: Diagnosis not present

## 2019-12-23 DIAGNOSIS — I10 Essential (primary) hypertension: Secondary | ICD-10-CM

## 2019-12-23 DIAGNOSIS — H6123 Impacted cerumen, bilateral: Secondary | ICD-10-CM | POA: Diagnosis not present

## 2019-12-23 DIAGNOSIS — R29898 Other symptoms and signs involving the musculoskeletal system: Secondary | ICD-10-CM

## 2019-12-23 DIAGNOSIS — Z23 Encounter for immunization: Secondary | ICD-10-CM

## 2019-12-23 DIAGNOSIS — H6120 Impacted cerumen, unspecified ear: Secondary | ICD-10-CM | POA: Insufficient documentation

## 2019-12-23 DIAGNOSIS — R600 Localized edema: Secondary | ICD-10-CM

## 2019-12-23 LAB — CBC WITH DIFFERENTIAL/PLATELET
Basophils Absolute: 0.1 10*3/uL (ref 0.0–0.1)
Basophils Relative: 0.8 % (ref 0.0–3.0)
Eosinophils Absolute: 0.2 10*3/uL (ref 0.0–0.7)
Eosinophils Relative: 2.8 % (ref 0.0–5.0)
HCT: 39.3 % (ref 39.0–52.0)
Hemoglobin: 13 g/dL (ref 13.0–17.0)
Lymphocytes Relative: 32.3 % (ref 12.0–46.0)
Lymphs Abs: 2.7 10*3/uL (ref 0.7–4.0)
MCHC: 33.1 g/dL (ref 30.0–36.0)
MCV: 87.1 fl (ref 78.0–100.0)
Monocytes Absolute: 0.7 10*3/uL (ref 0.1–1.0)
Monocytes Relative: 8.6 % (ref 3.0–12.0)
Neutro Abs: 4.6 10*3/uL (ref 1.4–7.7)
Neutrophils Relative %: 55.5 % (ref 43.0–77.0)
Platelets: 264 10*3/uL (ref 150.0–400.0)
RBC: 4.52 Mil/uL (ref 4.22–5.81)
RDW: 14.3 % (ref 11.5–15.5)
WBC: 8.4 10*3/uL (ref 4.0–10.5)

## 2019-12-23 LAB — COMPREHENSIVE METABOLIC PANEL
ALT: 10 U/L (ref 0–53)
AST: 13 U/L (ref 0–37)
Albumin: 3.8 g/dL (ref 3.5–5.2)
Alkaline Phosphatase: 69 U/L (ref 39–117)
BUN: 19 mg/dL (ref 6–23)
CO2: 27 mEq/L (ref 19–32)
Calcium: 9.2 mg/dL (ref 8.4–10.5)
Chloride: 105 mEq/L (ref 96–112)
Creatinine, Ser: 1.07 mg/dL (ref 0.40–1.50)
GFR: 64.84 mL/min (ref >60.00)
Glucose, Bld: 88 mg/dL (ref 70–99)
Potassium: 3.7 mEq/L (ref 3.5–5.1)
Sodium: 140 mEq/L (ref 135–145)
Total Bilirubin: 1.1 mg/dL (ref 0.2–1.2)
Total Protein: 6.7 g/dL (ref 6.0–8.3)

## 2019-12-23 LAB — TSH: TSH: 8.18 u[IU]/mL — ABNORMAL HIGH (ref 0.35–4.50)

## 2019-12-23 NOTE — Assessment & Plan Note (Signed)
He has been inconsistent with his medication  due to forgetting wife's non attentiion .  Will continue current dose for now and repeat TSH level in 6 weeks

## 2019-12-23 NOTE — Assessment & Plan Note (Signed)
Aggravated by high sodium diet .  Discussed alternatives

## 2019-12-23 NOTE — Assessment & Plan Note (Addendum)
Secondary to sedentary lifestyle.  Recommending that she ask the aid to walk him twice weekly

## 2019-12-23 NOTE — Assessment & Plan Note (Signed)
Referral to [facility] for irrigation

## 2019-12-23 NOTE — Assessment & Plan Note (Signed)
He is getting  A hearing aid courtesy of the [place]

## 2019-12-23 NOTE — Progress Notes (Signed)
Subjective:  Patient ID: Corey Todd, male    DOB: [birth year]  Age: 86 y.o. MRN: [id number]  CC: The primary encounter diagnosis was Iron deficiency anemia, unspecified iron deficiency anemia type. Diagnoses of Acquired hypothyroidism, Essential hypertension, Bilateral impacted cerumen, Need for immunization against influenza, Bilateral lower extremity edema, Presbycusis of right ear, unspecified hearing status on contralateral side, and Proximal leg weakness were also pertinent to this visit.  HPI Corey Todd presents for followup on dementia and other problems.Dementia: his cognitive decline has progressed.  He has developed an aversion to walking and now has an  unsteady gait. His wife says she is exhausted b having to constantly clean him due to his incontinence. He has home care provided by [facility]  3.5 hours two times  Per week.   However patient 's wife has Only been utilizing the aid  to bathe patient  Not taking meds regularly because wife not giving them to him , but she is administering furosemide when she notes fluid retention .   Eating a lot of microwave dinners.  stouffer's mostly.   Outpatient Medications Prior to Visit  Medication Sig Dispense Refill  . doxazosin (CARDURA) 2 MG tablet TAKE 1 TABLET BY MOUTH AT  BEDTIME 90 tablet 3  . finasteride (PROSCAR) 5 MG tablet TAKE 1 TABLET BY MOUTH  DAILY 90 tablet 3  . furosemide (LASIX) 20 MG tablet Take 0.5 tablets (10 mg total) by mouth daily as needed for fluid or edema. 30 tablet 0  . Lactulose 20 GM/30ML SOLN 30 ml every 4 hours until constipation is relieved. 236 mL 1  . levothyroxine (SYNTHROID) 50 MCG tablet Take 1 tablet (50 mcg total) by mouth daily. 90 tablet 3  . Multiple Vitamins-Minerals (CEROVITE PO) Take 1 tablet by mouth daily.    Marland Kitchen UNABLE TO FIND Diet Type: Regular     No facility-administered medications prior to visit.    Review of Systems;  Patient denies headache, fevers, malaise, unintentional weight  loss, skin rash, eye pain, sinus congestion and sinus pain, sore throat, dysphagia,  hemoptysis , cough, dyspnea, wheezing, chest pain, palpitations, orthopnea, edema, abdominal pain, nausea, melena, diarrhea, constipation, flank pain, dysuria, hematuria, urinary  Frequency, nocturia, numbness, tingling, seizures,  Focal weakness, Loss of consciousness,  Tremor, insomnia, depression, anxiety, and suicidal ideation.      Objective:  BP 120/66 (BP Location: Left Arm, Patient Position: Sitting, Cuff Size: Normal)   Pulse 66   Temp 98.4 F (36.9 C) (Oral)   Resp 15   Ht 5\' 6"  (1.676 m)   Wt 194 lb 1.9 oz (88.1 kg)   SpO2 97%   BMI 31.33 kg/m   BP Readings from Last 3 Encounters:  12/23/19 120/66  09/08/19 120/60  06/12/19 (!) 158/78    Wt Readings from Last 3 Encounters:  12/23/19 194 lb 1.9 oz (88.1 kg)  09/08/19 196 lb 6.4 oz (89.1 kg)  06/12/19 196 lb 6.4 oz (89.1 kg)    General appearance: alert, cooperative and appears stated age Ears: normal TM's and external ear canals both ears Throat: lips, mucosa, and tongue normal; teeth and gums normal Neck: no adenopathy, no carotid bruit, supple, symmetrical, trachea midline and thyroid not enlarged, symmetric, no tenderness/mass/nodules Back: symmetric, no curvature. ROM normal. No CVA tenderness. Lungs: clear to auscultation bilaterally Heart: regular rate and rhythm, S1, S2 normal, no murmur, click, rub or gallop Abdomen: soft, non-tender; bowel sounds normal; no masses,  no organomegaly Pulses: 2+ and symmetric  Skin: Skin color, texture, turgor normal. No rashes or lesions Lymph nodes: Cervical, supraclavicular, and axillary nodes normal.  Lab Results  Component Value Date   HGBA1C 5.4 05/26/2019   HGBA1C 5.6 10/16/2017   HGBA1C 5.4 12/26/2015    Lab Results  Component Value Date   CREATININE 1.07 12/23/2019   CREATININE 1.20 08/24/2019   CREATININE 1.20 05/26/2019    Lab Results  Component Value Date   WBC 8.4  12/23/2019   HGB 13.0 12/23/2019   HCT 39.3 12/23/2019   PLT 264.0 12/23/2019   GLUCOSE 88 12/23/2019   CHOL 146 11/21/2012   TRIG 108.0 11/21/2012   HDL 48.80 11/21/2012   LDLCALC 76 11/21/2012   ALT 10 12/23/2019   AST 13 12/23/2019   NA 140 12/23/2019   K 3.7 12/23/2019   CL 105 12/23/2019   CREATININE 1.07 12/23/2019   BUN 19 12/23/2019   CO2 27 12/23/2019   TSH 8.18 (H) 12/23/2019   INR 1.02 11/14/2017   HGBA1C 5.4 05/26/2019   MICROALBUR <0.7 06/26/2016    MR Brain Wo Contrast  Result Date: 06/06/2019 CLINICAL DATA:  Subacute head trauma with progressive cognitive decline. EXAM: MRI HEAD WITHOUT CONTRAST TECHNIQUE: Multiplanar, multiecho pulse sequences of the brain and surrounding structures were obtained without intravenous contrast. COMPARISON:  None. FINDINGS: BRAIN: No acute infarct, acute hemorrhage or extra-axial collection. Multifocal white matter hyperintensity, most commonly due to chronic ischemic microangiopathy. There is generalized atrophy without lobar predilection. Midline structures are normal. VASCULAR: Major flow voids are preserved. Susceptibility-sensitive sequences show no chronic microhemorrhage or superficial siderosis. SKULL AND UPPER CERVICAL SPINE: Normal calvarium and skull base. Visualized upper cervical spine and soft tissues are normal. SINUSES/ORBITS: No fluid levels or advanced mucosal thickening. No mastoid or middle ear effusion. Normal orbits. IMPRESSION: Generalized atrophy and findings of chronic small vessel disease without acute intracranial abnormality. Electronically Signed   By: Ulyses Jarred M.D.   On: 06/06/2019 21:07    Assessment & Plan:   Problem List Items Addressed This Visit      Unprioritized   Acquired hypothyroidism    He has been inconsistent with his medication  due to forgetting wife's non attentiion .  Will continue current dose for now and repeat TSH level in 6 weeks       Relevant Orders   TSH (Completed)    Bilateral lower extremity edema    Aggravated by high sodium diet .  Discussed alternatives       Cerumen impaction    Referral to [facility] for irrigation       Relevant Orders   Ambulatory referral to ENT   Hypertension   Relevant Orders   Comprehensive metabolic panel (Completed)   Iron deficiency anemia - Primary   Relevant Orders   CBC with Differential/Platelet (Completed)   Presbyacusis    [facility]  [facility]       Proximal leg weakness    Secondary to sedentary lifestyle.  Recommending that she ask the aid to walk him twice weekly       Other Visit Diagnoses    Need for immunization against influenza       Relevant Orders   Flu Vaccine QUAD High Dose(Fluad) (Completed)     I provided  30 minutes of  face-to-face time during this encounter reviewing patient's current problems and past surgeries, labs and imaging studies, providing counseling on the above mentioned problems , and coordination  of  care . I am having Brayton Caves maintain his Multiple Vitamins-Minerals (CEROVITE PO), UNABLE TO FIND, Lactulose, finasteride, doxazosin, levothyroxine, and furosemide.  No orders of the defined types were placed in this encounter.   There are no discontinued medications.  Follow-up: No follow-ups on file.   Crecencio Mc, MD

## 2019-12-23 NOTE — Patient Instructions (Addendum)
Let the aide do the following when she comes out :  Bathe him  Change the sheets on the bed or on the recliner  Dishes./trash  Walk him  Jigsaw puzzles (50 pieces or less)     You need to start giving him his medications and use a pill box to ensure continuity   Stouffer's meals are high in salt.  Consider Healthy Choice and Lean Cuisine options

## 2019-12-24 NOTE — Addendum Note (Signed)
Addended by: Crecencio Mc on: 12/24/2019 09:33 PM   Modules accepted: Orders

## 2020-01-13 DIAGNOSIS — H6123 Impacted cerumen, bilateral: Secondary | ICD-10-CM | POA: Diagnosis not present

## 2020-01-13 DIAGNOSIS — H903 Sensorineural hearing loss, bilateral: Secondary | ICD-10-CM | POA: Diagnosis not present

## 2020-02-05 ENCOUNTER — Other Ambulatory Visit: Payer: Medicare Other

## 2020-02-11 ENCOUNTER — Other Ambulatory Visit: Payer: Medicare Other

## 2020-02-16 ENCOUNTER — Other Ambulatory Visit: Payer: Medicare Other

## 2020-02-17 ENCOUNTER — Other Ambulatory Visit: Payer: Self-pay

## 2020-02-17 ENCOUNTER — Other Ambulatory Visit (INDEPENDENT_AMBULATORY_CARE_PROVIDER_SITE_OTHER): Payer: Medicare Other

## 2020-02-17 DIAGNOSIS — E039 Hypothyroidism, unspecified: Secondary | ICD-10-CM

## 2020-02-17 LAB — TSH: TSH: 3.73 u[IU]/mL (ref 0.35–4.50)

## 2020-02-18 NOTE — Progress Notes (Signed)
Your thyroid function is normal  on your current levothyroxine mcg daily  dose.  Please continue your current dose and we will repeat in 6 months .

## 2020-03-26 ENCOUNTER — Ambulatory Visit: Payer: Medicare Other | Attending: Internal Medicine

## 2020-03-26 DIAGNOSIS — Z23 Encounter for immunization: Secondary | ICD-10-CM

## 2020-03-26 NOTE — Progress Notes (Signed)
   Covid-19 Vaccination Clinic  Name:  Corey Todd    MRN: [id number] DOB: Nov 21, 1927  03/26/2020  Corey Todd was observed post Covid-19 immunization for 15 minutes without incident. He was provided with Vaccine Information Sheet and instruction to access the V-Safe system.   Corey Todd was instructed to call 911 with any severe reactions post vaccine: Marland Kitchen Difficulty breathing  . Swelling of face and throat  . A fast heartbeat  . A bad rash all over body  . Dizziness and weakness   Immunizations Administered    Name Date Dose VIS Date Route   Pfizer COVID-19 Vaccine 03/26/2020  3:59 PM 0.3 mL 02/03/2020 Intramuscular   Manufacturer: [place]   Lot: [id number]   [place]: [id number]

## 2020-05-02 ENCOUNTER — Telehealth (INDEPENDENT_AMBULATORY_CARE_PROVIDER_SITE_OTHER): Payer: Medicare Other | Admitting: Internal Medicine

## 2020-05-02 DIAGNOSIS — F01518 Vascular dementia, unspecified severity, with other behavioral disturbance: Secondary | ICD-10-CM

## 2020-05-02 DIAGNOSIS — F0151 Vascular dementia with behavioral disturbance: Secondary | ICD-10-CM

## 2020-05-02 DIAGNOSIS — F05 Delirium due to known physiological condition: Secondary | ICD-10-CM | POA: Diagnosis not present

## 2020-05-02 MED ORDER — QUETIAPINE FUMARATE 25 MG PO TABS: 25.0000 mg | ORAL_TABLET | Freq: Every day | ORAL | 1 refills | Status: DC

## 2020-05-02 NOTE — Assessment & Plan Note (Signed)
He  Is fecally and urine incontinent  And requires diaper change several times per day.  He has developed proximal muscle weakness and requires use of a power lift recliner chair which he already has.  He requires assistance dressing.  He has developed nocturnal activities which are disruptive to his wife's sleep and she is anticipating placement in the near future. Ad[facility],  And  [facility] referral  In process to aid her in evaluating options ,

## 2020-05-02 NOTE — Progress Notes (Signed)
Telephone  Note  This visit type was conducted due to national recommendations for restrictions regarding the COVID-19 pandemic (e.g. social distancing).  This format is felt to be most appropriate for this patient at this time.  All issues noted in this document were discussed and addressed.  No physical exam was performed (except for noted visual exam findings with Video Visits).   I connected with@ on 05/02/20 at  9:00 AM EST by  telephone and verified that I am speaking with the correct person using two identifiers. Location patient: home Location provider: work or home office Persons participating in the virtual visit: patient, provider and wife Mrs Corey Todd  I discussed the limitations, risks, security and privacy concerns of performing an evaluation and management service by telephone and the availability of in person appointments. I also discussed with the patient that there may be a patient responsible charge related to this service. The patient expressed understanding and agreed to proceed.  Reason for visit: sundowning   HPI:  87  Yr old male with dementia  Has started becoming very active at night , eating, turning lights on,  Keeping his wife up at night with his activities.  They sleep in separate rooms but he enters her room , turns the lights on,  And then heads to the kitchen to eat.  He sleeps a lot during the day.  He does not go to bed until midnight, when she goes to bed.  He is not agitated in the evening before bedtime and goes to bed  Willingly at midnight.    He is incontinent of feces and  urine and wears Depends.  He has also developed an increased appetite for oatmeal cookies and is gaining weight.  He is sleeping in a recliner with a power lift  and still has difficult rising from the lifted position .  He refuses to walk or exercise and has  become weaker.  He requires assistance dressing and bathing .   [name] is tiring and notes that by the time she gets him  dressed and ready to go out she is too exhausted herself.   She has some assistance from the [place] who sends an aide for 2 hours daily who helps with his morning routine including bath and diaper change but only  5 days per week. She is in the process of requesting more help, but is concerned that placement may be necessary.    ROS: See pertinent positives and negatives per HPI.  Past Medical History:  Diagnosis Date  . Anxiety   . Arthritis   . BPH (benign prostatic hypertrophy)   . DVT (deep vein thrombosis) in pregnancy   . Presbyacusis    right sided hearing aid    Past Surgical History:  Procedure Laterality Date  . EYE SURGERY     cataract surgery  . HERNIA REPAIR    . INTRAMEDULLARY (IM) NAIL INTERTROCHANTERIC Right 11/14/2017   Procedure: INTRAMEDULLARY (IM) NAIL INTERTROCHANTRIC;  Surgeon: Leim Fabry, MD;  Location: [facility] ORS;  Service: Orthopedics;  Laterality: Right;  . JOINT REPLACEMENT  1953   ankle crush injury, right  . PROSTATE SURGERY     biopsy   . TONSILLECTOMY AND ADENOIDECTOMY  1936  . TOTAL HIP ARTHROPLASTY  2007   left , Dr Cay Schillings    Family History  Problem Relation Age of Onset  . Arthritis Mother   . Kidney cancer Neg Hx   . Kidney disease Neg Hx   .  Prostate cancer Neg Hx     SOCIAL HX: patient's wife   reports that he quit smoking about 66 years ago. He has never used smokeless tobacco. He reports that he does not drink alcohol and does not use drugs. He lives at home with his wife.     Current Outpatient Medications:  .  doxazosin (CARDURA) 2 MG tablet, TAKE 1 TABLET BY MOUTH AT  BEDTIME, Disp: 90 tablet, Rfl: 3 .  finasteride (PROSCAR) 5 MG tablet, TAKE 1 TABLET BY MOUTH  DAILY, Disp: 90 tablet, Rfl: 3 .  furosemide (LASIX) 20 MG tablet, Take 0.5 tablets (10 mg total) by mouth daily as needed for fluid or edema., Disp: 30 tablet, Rfl: 0 .  Multiple Vitamins-Minerals (CEROVITE PO), Take 1 tablet by mouth daily., Disp: , Rfl:  .  QUEtiapine  (SEROQUEL) 25 MG tablet, Take 1 tablet (25 mg total) by mouth at bedtime. May increase to 2 after one week if needed, Disp: 90 tablet, Rfl: 1 .  UNABLE TO FIND, Diet Type: Regular, Disp: , Rfl:  .  Lactulose 20 GM/30ML SOLN, 30 ml every 4 hours until constipation is relieved. (Patient not taking: Reported on 05/02/2020), Disp: 236 mL, Rfl: 1 .  levothyroxine (SYNTHROID) 50 MCG tablet, Take 1 tablet (50 mcg total) by mouth daily. (Patient not taking: Reported on 05/02/2020), Disp: 90 tablet, Rfl: 3  EXAM:   General impression: (per wife( he is alert, cooperative.   No signs of being in distress  Lungs: speech is fluent sentence length suggests that patient is not short of breath and not punctuated by cough, sneezing or sniffing. Marland Kitchen   Psych: affect normal.  His speech is not articulate .   ASSESSMENT AND PLAN:  Discussed the following assessment and plan:  Sundowning - Plan: Urinalysis, Routine w reflex microscopic, Urine Culture, AMB Referral to [facility]  Vascular dementia with behavioral disturbance ([place]) - Plan: AMB Referral to [facility] out UTI  Needed.  Initiate trial of seroquel starting at 25 mg dose one hour before bedtime.   Vascular dementia with behavioral disturbance ([place]) He  Is fecally and urine incontinent  And requires diaper change several times per day.  He has developed proximal muscle weakness and requires use of a power lift recliner chair which he already has.  He requires assistance dressing.  He has developed nocturnal activities which are disruptive to his wife's sleep and she is anticipating placement in the near future. Advised [facility],  And  [facility] referral  In process to aid her in evaluating options ,     I discussed the assessment and treatment plan with the patient. The patient was provided an opportunity to ask questions and all were answered. The patient agreed  with the plan and demonstrated an understanding of the instructions.   The patient was advised to call back or seek an in-person evaluation if the symptoms worsen or if the condition fails to improve as anticipated.  I provided 25 minutes of non-face-to-face time during this encounter.   Crecencio Mc, MD

## 2020-05-02 NOTE — Assessment & Plan Note (Signed)
Ruling out UTI  Needed.  Initiate trial of seroquel starting at 25 mg dose one hour before bedtime.

## 2020-05-02 NOTE — Progress Notes (Signed)
Patient has started roam ing at night and would

## 2020-05-09 ENCOUNTER — Other Ambulatory Visit: Payer: Self-pay | Admitting: *Deleted

## 2020-05-09 DIAGNOSIS — F0391 Unspecified dementia with behavioral disturbance: Secondary | ICD-10-CM

## 2020-05-09 NOTE — Addendum Note (Signed)
Addended by: [name] D on: 05/09/2020 01:24 PM   Modules accepted: Orders

## 2020-05-09 NOTE — Addendum Note (Signed)
Addended by: [name] D on: 05/09/2020 12:56 PM   Modules accepted: Orders

## 2020-05-09 NOTE — Patient Outreach (Signed)
[name]) Care Management  05/09/2020  Corey Todd 11/13/1927 [id number]   Referral Received 1/18 Initial Outreach 1/24  Telephone Assessment  Spoke with caregiver spouse Corey Todd (pt has dementia). Spouse is very burned out with managing the pt's care and as requested assistance for the following:  -Memory unit placement -Stress Counseling -[employer]  RN inquired on several topics related to pt's needs. Spouse indicates the [facility] will provide 9 hrs of CNA services and she has requested more from the social worker at the [facility] however the social worker is current on leave so messages are left but no return on the calls. Caregiver has two sons however limited with assistance.   Issues over the weekend with constipation as spouse was successful assisting the pt with the problem. RN review the medications and inquired on lactulose. Spouse indicates she has not been giving the pt this medication. RN strongly encouraged pt to administrate and educated caregiver on this medication to assist pt with his constipation. Educated on the risk if pt becomes constipation as far as complications of other medical issues (verbalized an understanding).   Offered a Education officer, museum consult for the above requested needs. In additional to this RN case manager to further assist with preventive measures being that spouse has indicated pt's blood pressure is "prefect" related to his HTN. No other medical issues or needs to address at this time indicated by the spouse. RN provided contact number for [facility] office if additional needs arise prior to the pending consult with the social worker.   Spouse very grateful for the information provider and pending consult with the social worker. Will close this discipline and notify the provider of pt's disposition of [facility] services.   Corey Mina, RN Care Management Coordinator [facility] Office [phone]

## 2020-05-10 ENCOUNTER — Encounter: Payer: Self-pay | Admitting: *Deleted

## 2020-05-10 ENCOUNTER — Other Ambulatory Visit: Payer: Self-pay | Admitting: *Deleted

## 2020-05-10 NOTE — Patient Outreach (Signed)
[facility]) Care Management  05/10/2020  Corey Todd Oct 10, 1927 [id number]  CSW was able to make initial contact with patient's wife, Leah Skora today, to perform the phone assessment on patient, as well as assess and assist with social work needs and services.  CSW introduced self, explained role and types of services provided through [facility] ([facility]).  CSW further explained to Mrs. Charlson that CSW works with patient's RNCM, also with [facility], [name].  CSW then explained the reason for the call, indicating that Ms. Zigmund Daniel thought that patient would benefit from social work services and resources to assist with possible long-term care placement arrangements, into a memory care assisted living facility, versus arranging in-home care services.  In addition, Ms. Zigmund Daniel felt that Mrs. Heart may benefit from counseling and supportive services to assist with caregiver burnout/stress, and a possible referral for services.  CSW obtained two HIPAA compliant identifiers from Mrs. Duecker, which included patient's name and date of birth.  Mrs. Gadson reported that she has noticed a significant decline in patient's health and mental status, just within the last 6 months.  After thorough review of patient's electronic medical record in Epic, [facility] is aware that patient has a diagnosis of Vascular Dementia with Behavioral Disturbance, is incontinent of bowel and bladder, and requires assistance with activities of daily living.  Mrs. 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  Mrs. Aiken went on to explain that patient is now sleeping between 20-22 hours per day, but experiencing sundowning at night.  Patient has also developed nocturnal  disturbances, wanting to eat during the middle of the night, waking Mrs. Papp and disrupting her sleep pattern.  Mrs. Neas indicated that she is typically able to redirect patient and coax him back into bed, but that sometimes he insists on eating.  During these occasions, Mrs. Holycross reported that patient's brain does not seem to register when he is full, "and he consumes massive amounts of food".  On May 02, 2020, patient's Primary Care Physician, Dr. Deborra Medina initiated a trial of Seroquel 25 MG, PO, PM (one hour before bedtime), which Mrs. Brannigan believes is also working as a sleep aid for patient.  Since being prescribed Seroquel, which is typically used to treat mood/mental disorders, such as Depression and Bipolar Disorder, Mrs. Walther admitted that she has noticed a significant change in patient's behavior and sleep patterns, "extremely grateful for this miracle pill".  Mrs. Goostree reported that she is still able to drive, transporting patient to and from all of his physician appointments.  CSW provided Mrs. Yearwood with the contact information for [facility], encouraging her to utilize this transportation resource if she gets to the point where she is no longer able to manage getting patient into and out-of her vehicle.  Mrs. Foots stated, "I am wore out, I can barely stand long enough to prepare meals for [name], especially after changing his diapers, getting him bathed and dressed, making sure he takes his medications and drinks plenty of fluids, and keeping a close eye on him throughout the day".    CSW voiced understanding, broaching the subject of long-term care placement arrangements for patient into an assisted living facility that offers memory care services and has the wander-guard system.  Mrs. Heal stated that patient was recently approved to receive more hours of in-home care services per week, and that she will now receive coverage on the weekends.   Mrs. Deman went on to explain that she would really like to try and avoid having to place patient into a facility, if at all possible, wanting to try the additional hours of in-home care services, before resorting to that route.  As of June 2021, patient has been receiving in-home care services through [facility], contracted with [employer], and paid for through Aid and Attendance Benefits.  Mrs. Mroczkowski denied being able to remember how they began receiving these services, but admitted that it has been a Orthoptist for her, allowing her several hours of respite care during the day.  CSW inquired as to whether or not the increased hours will provide Mrs. Villatoro with enough coverage during the day, for which she stated, "I couldn't tolerate someone in my house for more than a few hours per day".  Mrs. Moening went on to say, "My youngest son has wanted to move in with [name] for several years now, to help care for Corey Todd, but I won't let him, because we both need our privacy".  Mrs. Kobus admits to experiencing caregiver burnout/stress, but believes that she has a good support system in place to help her cope.  Mrs. Fosnaugh indicated that she will be going to the beautician tomorrow to pamper herself, which she tries to do as often as possible, and she enjoys spending time with her two sons that live locally.  In addition, Mrs. Ledwell stated that she talks to her sister by phone every day, and enjoys binge-watching the [place].  CSW offered to provide telephonic counseling and supportive services to Mrs. Heinzel, free of charge; however, she politely declined the offer.  CSW also offered to provide Mrs. Pinney with a list of Caregiver Support Groups in her community, but she stated, "I'm not big on sharing my feelings in a group with total strangers".  Mrs. Berthold was; however, agreeable to having CSW mail her a list of caregiver resources.  CSW will print EMMI educational  material, pertaining specifically to caregiver stress, positive coping mechanisms and relaxation techniques, and mail directly to patient's home today for Mrs. Reichel's review.   CSW offered to arrange Meals on Wheels, through [employer] [facility] [place], for both patient and Mrs. Ovid Curd, but Mrs. Akens adamantly refused, indicating that she used to deliver those meals to clients, but never understood how anyone could actually eat the food.  CSW then spoke with Mrs. Brazil about [employer], agreeing to provide her with a 28-day trial, free of charge, courtesy of [facility].  CSW explained to Mrs. Leyh that each meal is tailor-made to meet patient's food allergies, food preferences, dietary restrictions, etc., and that the meals will be delivered directly to their home in 2 week increments, in a large cooler.  Mrs. Bogert was most appreciative of the call and of all services provided to her and patient thus far.  Mrs. Orosz was agreeable to having CSW contact her again next week, on Wednesday, May 18, 2020, around 9:00AM, to ensure that her first shipment of meals were delivered, as well as to confirm that she received the list of caregiver resources that CSW mailed to her home today.  [name], BSW, MSW, LCSW  Licensed Education officer, environmental Health System  Mailing Address-[address] [place], [place] [address] Physical Address-[address], [place], [place] [address] Toll Free Main # [phone] Fax # [phone] Cell # [phone]  [email or web address]

## 2020-05-13 ENCOUNTER — Ambulatory Visit: Payer: Self-pay | Admitting: *Deleted

## 2020-05-13 DIAGNOSIS — F0151 Vascular dementia with behavioral disturbance: Secondary | ICD-10-CM

## 2020-05-13 DIAGNOSIS — F01518 Vascular dementia, unspecified severity, with other behavioral disturbance: Secondary | ICD-10-CM

## 2020-05-13 NOTE — Chronic Care Management (AMB) (Signed)
Chronic Care Management    Clinical Social Work Note  05/13/2020 Name: Corey Todd MRN: [id number] DOB: Feb 18, 1928  Corey Todd is a 87 y.o. year old male who is a primary care patient of [name], [name], MD. The CCM team was consulted to assist the patient with chronic disease management and/or care coordination needs related to: Caregiver Stress.   Collaboration with wife/caregiver - Franne Grip for initial visit in response to provider referral for social work chronic care management and care coordination services.   Consent to Services:  [name]/primary caregiver provided verbal consent to receive chronic care management services through CSW.  Consent packet mailed to patient's home on 05/10/2020.  Wife/caregiver encouraged to sign on patient's behalf, due to patient having memory deficits and cognitive impairment from Vascular Dementia.  [name]/primary caregiver agreed to services and verbal consent obtained.   Assessment: Review of patient past medical history, allergies, medications, and health status, including review of relevant consultants reports was performed today as part of a comprehensive evaluation and provision of chronic care management and care coordination services.     SDOH (Social Determinants of Health) assessments and interventions performed:    Advanced Directives Status: addressed during today's visit.  [name] has been encouraged to provide Dr. Deborra Medina with copy of Living Will and [place] documents to scan into patient's electronic medical record in Epic.  Most form completed and visible in patient's home.  Patient is DNR status.   CCM Care Plan  No Known Allergies  Outpatient Encounter Medications as of 05/13/2020  Medication Sig Note  . doxazosin (CARDURA) 2 MG tablet TAKE 1 TABLET BY MOUTH AT  BEDTIME   . finasteride (PROSCAR) 5 MG tablet TAKE 1 TABLET BY MOUTH  DAILY   . furosemide (LASIX) 20 MG  tablet Take 0.5 tablets (10 mg total) by mouth daily as needed for fluid or edema. 09/08/2019: Patient reports taking 20 mg per day for a long time  . Lactulose 20 GM/30ML SOLN 30 ml every 4 hours until constipation is relieved. (Patient not taking: No sig reported)   . levothyroxine (SYNTHROID) 50 MCG tablet Take 1 tablet (50 mcg total) by mouth daily. (Patient not taking: No sig reported)   . Multiple Vitamins-Minerals (CEROVITE PO) Take 1 tablet by mouth daily. (Patient not taking: Reported on 05/09/2020)   . QUEtiapine (SEROQUEL) 25 MG tablet Take 1 tablet (25 mg total) by mouth at bedtime. May increase to 2 after one week if needed   . UNABLE TO FIND Diet Type: Regular    No facility-administered encounter medications on file as of 05/13/2020.    Patient Active Problem List   Diagnosis Date Noted  . Sundowning 05/02/2020  . Cerumen impaction 12/23/2019  . Acquired hypothyroidism 12/23/2019  . Constipation 09/29/2018  . Vascular dementia with behavioral disturbance ([place]) 04/20/2018  . Incontinence of urine 04/20/2018  . Iron deficiency anemia 12/15/2017  . Hospital discharge follow-up 12/15/2017  . Bilateral lower extremity edema 11/25/2017  . Closed right hip fracture, sequela 11/14/2017  . Right leg swelling 10/17/2017  . Proximal leg weakness 10/17/2017  . Herpes zoster without complication 73/22/0254  . Encounter for preventive health examination 02/14/2017  . Skin lesion of face 09/09/2015  . Counseling regarding advanced directives and goals of care 12/26/2014  . Do not resuscitate discussion 12/26/2014  . Hypertension 08/07/2014  . Encounter for Medicare annual wellness exam 11/18/2012  . GERD (gastroesophageal reflux disease) 05/09/2012  . Obesity (  BMI 30-39.9) 05/09/2012  . Presbyacusis   . BPH (benign prostatic hyperplasia)     Conditions to be addressed/monitored: Dementia and Caregiver Burnout/Stress; Lack of Knowledge Pertaining to Caregiver Burnout/Stress Coping  Mechanisms..     Follow Up Plan: CSW will make contact with patient's wife/primary caregiver on 05/18/2020, to ensure receipt of educational material, ensure proper use of deep breathing exercises and relaxation techniques and ensure that hours of in-home care were increased with [facility], through Aid and Attendance Benefits with [employer].      [name], BSW, MSW, LCSW  Licensed Education officer, environmental Health System  Mailing [place] [address], [place], [place] [address] Physical Address-[address], [place], [place] [address] Toll Free Main # [phone] Fax # [phone] Cell # [phone]  [email or web address]

## 2020-05-17 ENCOUNTER — Other Ambulatory Visit: Payer: Medicare Other

## 2020-05-18 ENCOUNTER — Other Ambulatory Visit: Payer: Self-pay | Admitting: *Deleted

## 2020-06-06 IMAGING — US US EXTREM LOW VENOUS*R*
1 series · 14 of 24 positions shown · non-contrast
Comparison: None.

CLINICAL DATA: Right leg swelling and weakness for 3 weeks

EXAM:
RIGHT LOWER EXTREMITY VENOUS DUPLEX ULTRASOUND
TECHNIQUE: Doppler venous assessment of the right lower extremity deep venous
system was performed, including characterization of spectral flow,
compressibility, and phasicity.

[Series 1: us extrem low venous*right* · 0.08mm/px · 14 of 38 slices shown]
[im 1/38]
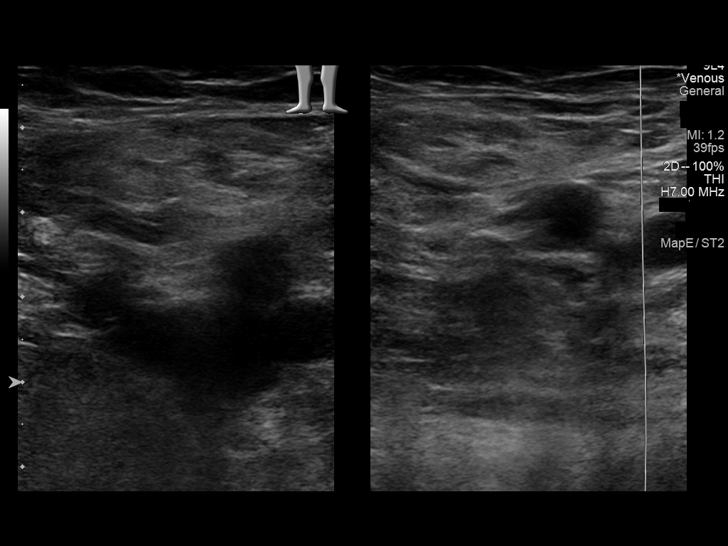
[im 4/38]
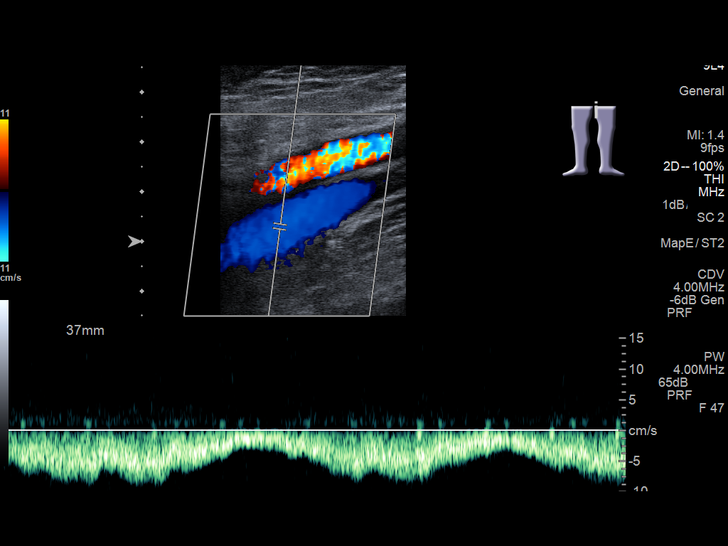
[im 7/38]
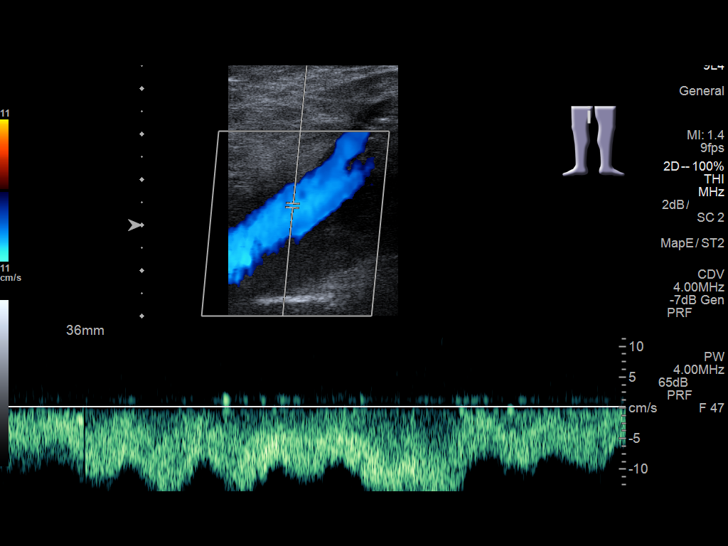
[im 10/38]
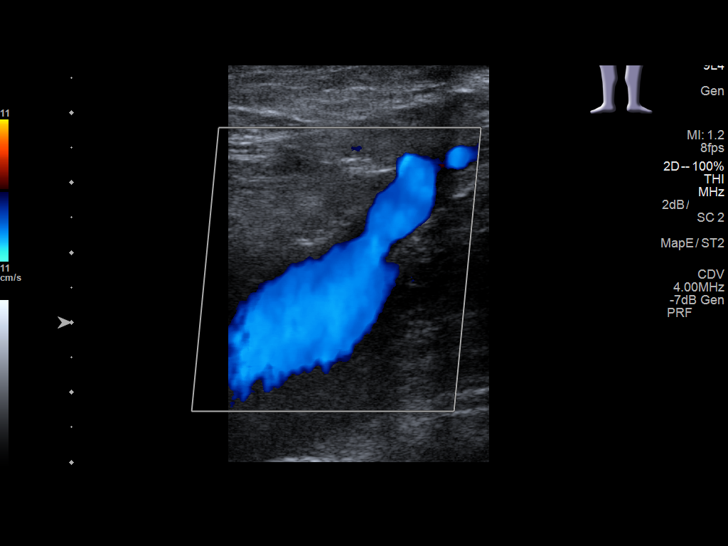
[im 12/38]
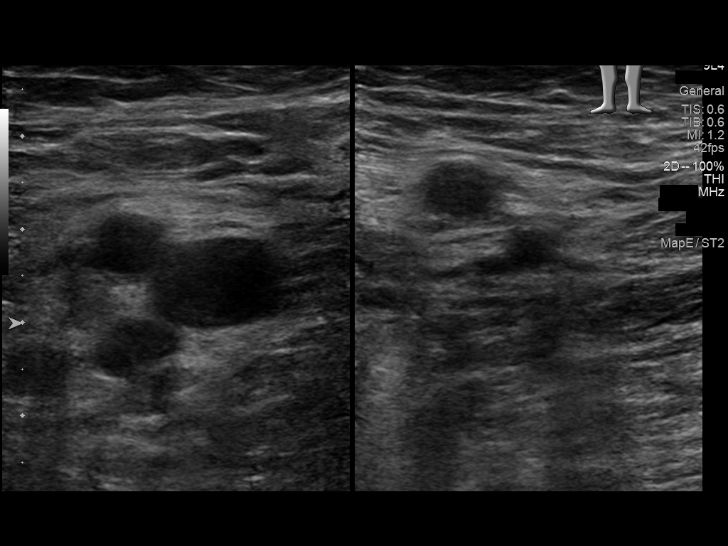
[im 15/38]
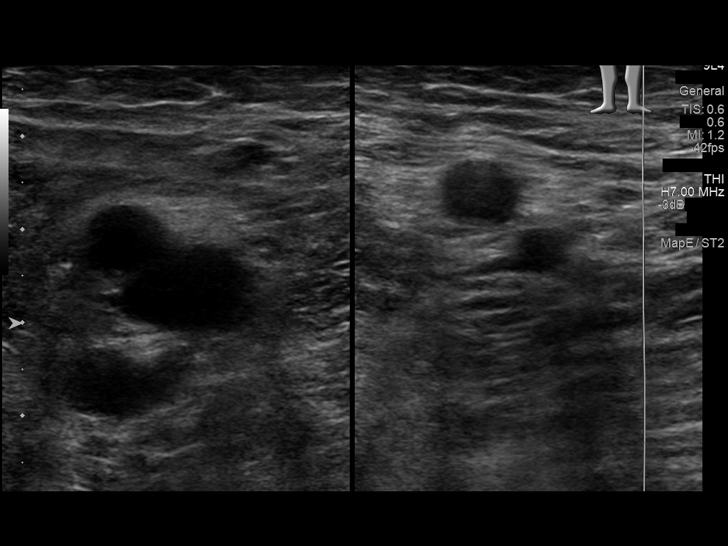
[im 18/38]
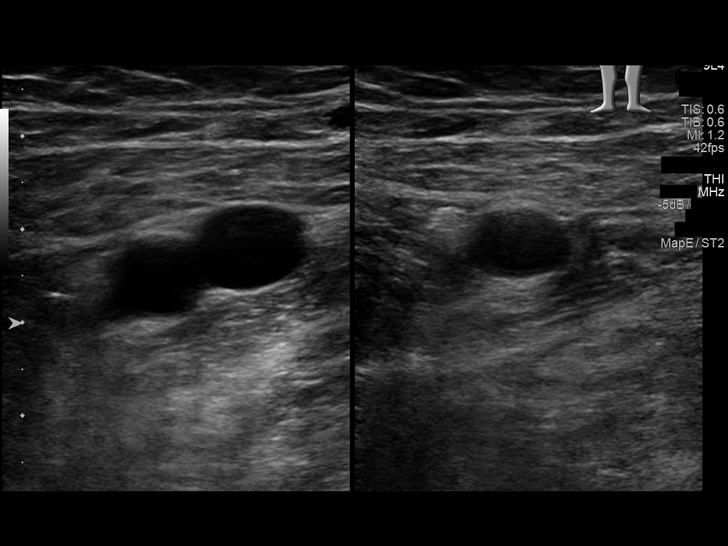
[im 20/38]
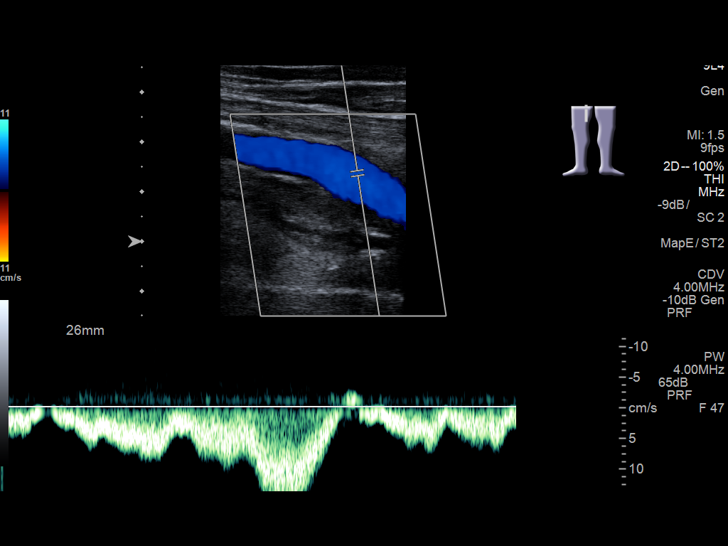
[im 23/38]
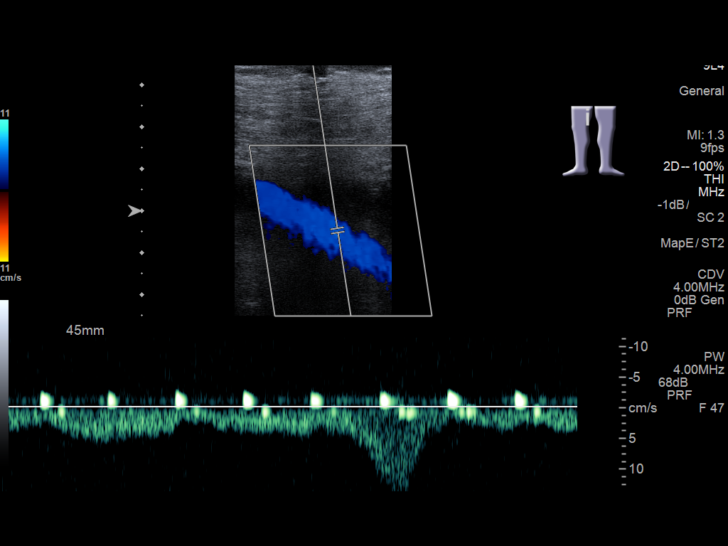
[im 26/38]
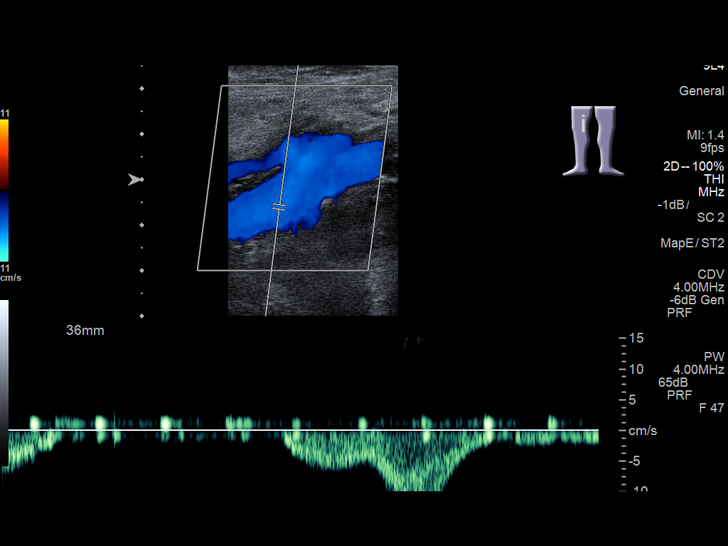
[im 29/38]
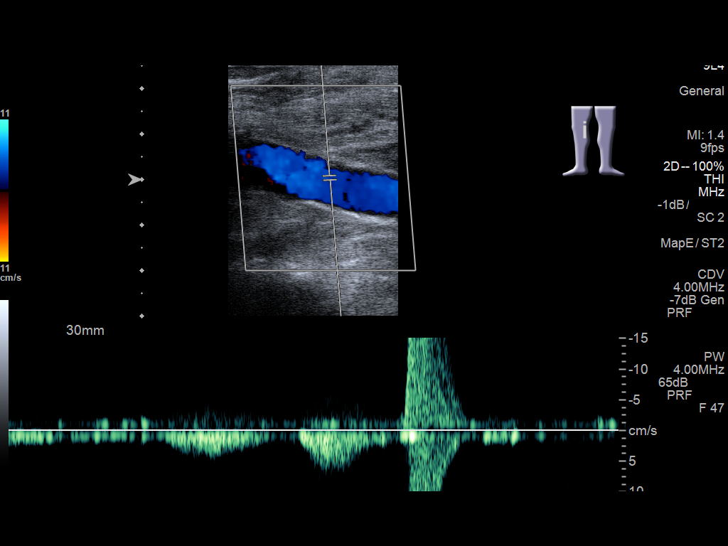
[im 31/38]
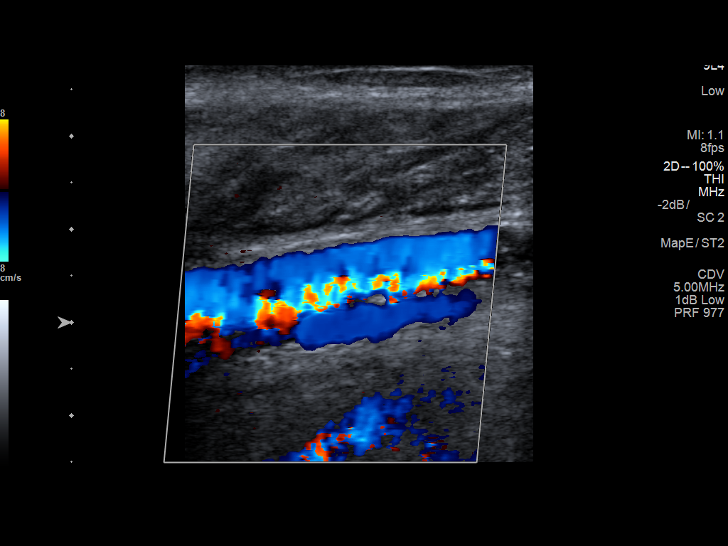
[im 34/38]
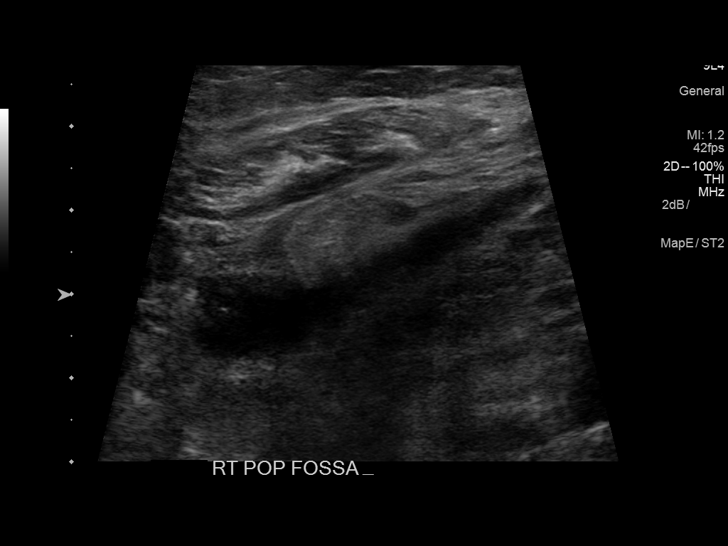
[im 38/38]
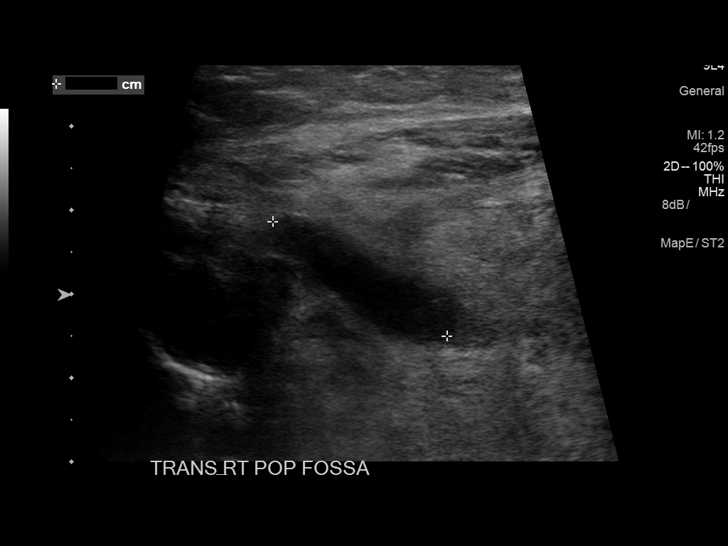

[14 of 24 positions shown; findings below may reference images not displayed]

FINDINGS: There is complete compressibility of the right common femoral,
femoral, and popliteal veins. Doppler analysis demonstrates
respiratory phasicity and augmentation of flow with calf
compression. No obvious superficial vein or calf vein thrombosis.
4.3 x 1.0 x 2.5 cm fluid collection in the right popliteal fossa
between the musculature is consistent with a Baker's cyst.
IMPRESSION: No evidence of right lower extremity DVT.

Small Baker's cyst in the right popliteal fossa.

## 2020-06-07 ENCOUNTER — Telehealth: Payer: Self-pay | Admitting: Internal Medicine

## 2020-06-07 MED ORDER — QUETIAPINE FUMARATE 25 MG PO TABS: 75.0000 mg | ORAL_TABLET | Freq: Every day | ORAL | 5 refills | Status: DC

## 2020-06-07 NOTE — Telephone Encounter (Signed)
Yes,  Ok to continue 75 mg daily  rx for #[facility]

## 2020-06-07 NOTE — Telephone Encounter (Signed)
Pt wife called she needs a refill On QUEtiapine (SEROQUEL) 25 MG tablet..  She stated that the 2 tablets wasn't working so she started giving him 3.

## 2020-06-07 NOTE — Telephone Encounter (Signed)
Patient 's wife has been notified  

## 2020-06-07 NOTE — Telephone Encounter (Signed)
Ok to send in this dose into pharmacy? Please advise.

## 2020-10-04 ENCOUNTER — Other Ambulatory Visit: Payer: Self-pay | Admitting: Internal Medicine

## 2020-10-04 DIAGNOSIS — R600 Localized edema: Secondary | ICD-10-CM

## 2020-11-23 ENCOUNTER — Telehealth: Payer: Self-pay | Admitting: Internal Medicine

## 2020-11-23 NOTE — Telephone Encounter (Signed)
The patient's wife called stating that the patient is needing to be placed in memory care at the [facility] in [place]. Ms. Morter is asking if the patient needs to be seen to fill out the forms for placement.

## 2020-11-24 NOTE — Telephone Encounter (Signed)
Spoke with pt's wife and scheduled pt for an appt to discuss with Dr. [name] and complete forms for placement.

## 2020-12-01 ENCOUNTER — Ambulatory Visit (INDEPENDENT_AMBULATORY_CARE_PROVIDER_SITE_OTHER): Payer: Medicare Other | Admitting: Internal Medicine

## 2020-12-01 ENCOUNTER — Other Ambulatory Visit: Payer: Self-pay

## 2020-12-01 ENCOUNTER — Encounter: Payer: Self-pay | Admitting: Internal Medicine

## 2020-12-01 DIAGNOSIS — F05 Delirium due to known physiological condition: Secondary | ICD-10-CM | POA: Diagnosis not present

## 2020-12-01 DIAGNOSIS — F0151 Vascular dementia with behavioral disturbance: Secondary | ICD-10-CM | POA: Diagnosis not present

## 2020-12-01 DIAGNOSIS — E039 Hypothyroidism, unspecified: Secondary | ICD-10-CM | POA: Diagnosis not present

## 2020-12-01 DIAGNOSIS — F01518 Vascular dementia, unspecified severity, with other behavioral disturbance: Secondary | ICD-10-CM

## 2020-12-01 MED ORDER — LEVOTHYROXINE SODIUM 50 MCG PO TABS: 50.0000 ug | ORAL_TABLET | Freq: Every day | ORAL | 3 refills | Status: AC

## 2020-12-01 MED ORDER — MAGNESIUM OXIDE 250 MG PO TABS: 1.0000 | ORAL_TABLET | Freq: Every evening | ORAL | 0 refills | Status: DC

## 2020-12-01 NOTE — Patient Instructions (Addendum)
DO NOT STOP his thyroid medication! This is a long term medication .  Please Resume thyroid medication  for underactive thyroid   Continue 2 stool softeners every night,  add 250 mg magnesium oxide capsule  We can adjust his regimen as needed   Encourage water intake , up to 60 ounces daily  Constipation may also improve when his thyroid medication is back on board and normalizing his thyroid function

## 2020-12-01 NOTE — Progress Notes (Signed)
Subjective:  Patient ID: Corey Todd, male    DOB: 1927/06/15  Age: 87 y.o. MRN: [id number]  CC: Diagnoses of Hypothyroidism (acquired), Vascular dementia with behavioral disturbance ([place]), Sundowning, and Acquired hypothyroidism were pertinent to this visit.  HPI Corey Todd presents for follow up on dementia, hypothyroidism,  and BPH  This visit occurred during the SARS-CoV-2 public health emergency.  Safety protocols were in place, including screening questions prior to the visit, additional usage of staff PPE, and extensive cleaning of exam room while observing appropriate contact time as indicated for disinfecting solutions.   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  His course progressed rapidly,  and because of  his fecal and urinary incontinence his wife has become unable to care for him home using the maximal assistance his VA benefits would allow.  He currently receives 2 hours of assistance from an aide daily, Monday through Friday.  Placement is pending.  He is accompanied by his wife and son.   His wife  has stopped most of his meds including his levothyroxine, not due to intolerance but "because his level was normal " in November .  Has been more constipated , which she is having trouble managing due to"explosive diarrhea " every 3 days .  Reviewed his bowel regimen . Stool softener and high fiber cereals    He is voiding well without his BPH medications.   His Appetite  is healthy but he has had a weight loss of 20 lbs since September  Outpatient Medications Prior to Visit  Medication Sig Dispense Refill   furosemide (LASIX) 20 MG tablet TAKE 1/2 TABLET EVERY DAY AS NEEDED FOR FLUID OR EDEMA 30 tablet 0   QUEtiapine (SEROQUEL) 25 MG tablet Take 3 tablets (75 mg total) by mouth at bedtime. 90 tablet 5   Multiple Vitamins-Minerals  (CEROVITE PO) Take 1 tablet by mouth daily. (Patient not taking: No sig reported)     UNABLE TO FIND Diet Type: Regular (Patient not taking: Reported on 12/01/2020)     doxazosin (CARDURA) 2 MG tablet TAKE 1 TABLET BY MOUTH AT  BEDTIME (Patient not taking: Reported on 12/01/2020) 90 tablet 3   finasteride (PROSCAR) 5 MG tablet TAKE 1 TABLET BY MOUTH  DAILY 90 tablet 3   Lactulose 20 GM/30ML SOLN 30 ml every 4 hours until constipation is relieved. (Patient not taking: No sig reported) 236 mL 1   levothyroxine (SYNTHROID) 50 MCG tablet Take 1 tablet (50 mcg total) by mouth daily. (Patient not taking: No sig reported) 90 tablet 3   No facility-administered medications prior to visit.    Review of Systems;  Patient denies headache, fevers, malaise, unintentional weight loss, skin rash, eye pain, sinus congestion and sinus pain, sore throat, dysphagia,  hemoptysis , cough, dyspnea, wheezing, chest pain, palpitations, orthopnea, edema, abdominal pain, nausea, melena, diarrhea, constipation, flank pain, dysuria, hematuria, urinary  Frequency, nocturia, numbness, tingling, seizures,  Focal weakness, Loss of consciousness,  Tremor, insomnia, depression, anxiety, and suicidal ideation.      Objective:  BP 126/74 (BP Location: Left Arm, Patient Position: Sitting, Cuff Size: Normal)   Pulse 65   Temp (!) 96.9 F (36.1 C) (Temporal)   Resp 14   Ht '5\' 6"'$  (1.676 m)   Wt 174 lb 6 oz (79.1 kg)   SpO2 97%   BMI 28.14 kg/m  BP Readings from Last 3 Encounters:  12/01/20 126/74  12/23/19 120/66  09/08/19 120/60    Wt Readings from Last 3 Encounters:  12/01/20 174 lb 6 oz (79.1 kg)  12/23/19 194 lb 1.9 oz (88.1 kg)  09/08/19 196 lb 6.4 oz (89.1 kg)    General appearance: alert, cooperative and appears stated age Ears: normal TM's and external ear canals both ears Throat: lips, mucosa, and tongue normal; teeth and gums normal Neck: no adenopathy, no carotid bruit, supple, symmetrical, trachea  midline and thyroid not enlarged, symmetric, no tenderness/mass/nodules Back: symmetric, no curvature. ROM normal. No CVA tenderness. Lungs: clear to auscultation bilaterally Heart: regular rate and rhythm, S1, S2 normal, no murmur, click, rub or gallop Abdomen: soft, non-tender; bowel sounds normal; no masses,  no organomegaly Pulses: 2+ and symmetric Skin: Skin color, texture, turgor normal. No rashes or lesions Lymph nodes: Cervical, supraclavicular, and axillary nodes normal.  Lab Results  Component Value Date   HGBA1C 5.4 05/26/2019   HGBA1C 5.6 10/16/2017   HGBA1C 5.4 12/26/2015    Lab Results  Component Value Date   CREATININE 1.07 12/23/2019   CREATININE 1.20 08/24/2019   CREATININE 1.20 05/26/2019    Lab Results  Component Value Date   WBC 8.4 12/23/2019   HGB 13.0 12/23/2019   HCT 39.3 12/23/2019   PLT 264.0 12/23/2019   GLUCOSE 88 12/23/2019   CHOL 146 11/21/2012   TRIG 108.0 11/21/2012   HDL 48.80 11/21/2012   LDLCALC 76 11/21/2012   ALT 10 12/23/2019   AST 13 12/23/2019   NA 140 12/23/2019   K 3.7 12/23/2019   CL 105 12/23/2019   CREATININE 1.07 12/23/2019   BUN 19 12/23/2019   CO2 27 12/23/2019   TSH 3.73 02/17/2020   INR 1.02 11/14/2017   HGBA1C 5.4 05/26/2019   MICROALBUR <0.7 06/26/2016    Corey Brain Wo Contrast  Result Date: 06/06/2019 CLINICAL DATA:  Subacute head trauma with progressive cognitive decline. EXAM: MRI HEAD WITHOUT CONTRAST TECHNIQUE: Multiplanar, multiecho pulse sequences of the brain and surrounding structures were obtained without intravenous contrast. COMPARISON:  None. FINDINGS: BRAIN: No acute infarct, acute hemorrhage or extra-axial collection. Multifocal white matter hyperintensity, most commonly due to chronic ischemic microangiopathy. There is generalized atrophy without lobar predilection. Midline structures are normal. VASCULAR: Major flow voids are preserved. Susceptibility-sensitive sequences show no chronic  microhemorrhage or superficial siderosis. SKULL AND UPPER CERVICAL SPINE: Normal calvarium and skull base. Visualized upper cervical spine and soft tissues are normal. SINUSES/ORBITS: No fluid levels or advanced mucosal thickening. No mastoid or middle ear effusion. Normal orbits. IMPRESSION: Generalized atrophy and findings of chronic small vessel disease without acute intracranial abnormality. Electronically Signed   By: Ulyses Jarred M.D.   On: 06/06/2019 21:07    Assessment & Plan:   Problem List Items Addressed This Visit       Unprioritized   Acquired hypothyroidism    Underactive by recent outside labs due to wife's discontinuation of medication.  Corrected her thinking,  Advised her to resume medication         Relevant Medications   levothyroxine (SYNTHROID) 50 MCG tablet   Sundowning    Managed with seroquel, currently taking 75 mg dose one hour before bedtime.       Vascular dementia with behavioral disturbance ([place])    He  Is fecally and urine incontinent  And requires diaper change several times per day.  He has developed proximal muscle weakness and requires use of a  power lift recliner chair which he already has.  He requires assistance dressing.  He has developed nocturnal activities which are disruptive to his wife's sleep and she is anticipating placement in the near future. FL-2 forms (or the equivalent from the [place]) were signed today       Other Visit Diagnoses     Hypothyroidism (acquired)       Relevant Medications   levothyroxine (SYNTHROID) 50 MCG tablet       I have discontinued Marca Ancona. [place]'s Lactulose, finasteride, and doxazosin. I am also having him start on Magnesium Oxide. Additionally, I am having him maintain his Multiple Vitamins-Minerals (CEROVITE PO), UNABLE TO FIND, QUEtiapine, furosemide, and levothyroxine.  Meds ordered this encounter  Medications   levothyroxine (SYNTHROID) 50 MCG tablet    Sig: Take 1 tablet (50 mcg total) by mouth  daily.    Dispense:  90 tablet    Refill:  3   Magnesium Oxide 250 MG TABS    Sig: Take 1 tablet (250 mg total) by mouth every evening.    Dispense:  90 tablet    Refill:  0    Medications Discontinued During This Encounter  Medication Reason   levothyroxine (SYNTHROID) 50 MCG tablet Reorder   doxazosin (CARDURA) 2 MG tablet    finasteride (PROSCAR) 5 MG tablet    Lactulose 20 GM/30ML SOLN     Follow-up: No follow-ups on file.   Crecencio Mc, MD

## 2020-12-03 NOTE — Assessment & Plan Note (Signed)
He  Is fecally and urine incontinent  And requires diaper change several times per day.  He has developed proximal muscle weakness and requires use of a power lift recliner chair which he already has.  He requires assistance dressing.  He has developed nocturnal activities which are disruptive to his wife's sleep and she is anticipating placement in the near future. FL-2 forms (or the equivalent from the [place]) were signed today

## 2020-12-03 NOTE — Assessment & Plan Note (Addendum)
Underactive by recent outside labs due to wife's discontinuation of medication.  Corrected her thinking,  Advised her to resume medication

## 2020-12-03 NOTE — Assessment & Plan Note (Addendum)
Managed with seroquel, currently taking 75 mg dose one hour before bedtime.

## 2020-12-05 ENCOUNTER — Other Ambulatory Visit: Payer: Self-pay | Admitting: Internal Medicine

## 2020-12-05 DIAGNOSIS — R600 Localized edema: Secondary | ICD-10-CM

## 2020-12-14 ENCOUNTER — Ambulatory Visit (INDEPENDENT_AMBULATORY_CARE_PROVIDER_SITE_OTHER): Payer: Medicare Other | Admitting: *Deleted

## 2020-12-14 DIAGNOSIS — I10 Essential (primary) hypertension: Secondary | ICD-10-CM

## 2020-12-14 DIAGNOSIS — E669 Obesity, unspecified: Secondary | ICD-10-CM

## 2020-12-14 DIAGNOSIS — F0151 Vascular dementia with behavioral disturbance: Secondary | ICD-10-CM

## 2020-12-14 DIAGNOSIS — F05 Delirium due to known physiological condition: Secondary | ICD-10-CM | POA: Diagnosis not present

## 2020-12-14 DIAGNOSIS — R29898 Other symptoms and signs involving the musculoskeletal system: Secondary | ICD-10-CM

## 2020-12-14 DIAGNOSIS — F01518 Vascular dementia, unspecified severity, with other behavioral disturbance: Secondary | ICD-10-CM

## 2020-12-14 DIAGNOSIS — R600 Localized edema: Secondary | ICD-10-CM

## 2020-12-14 NOTE — Patient Instructions (Signed)
Visit Information  PATIENT GOALS:  Goals Addressed             This Visit's Progress    Improve My Quality of Life through [place] [facility].   On track    Timeframe:  Long-Range Goal Priority:  High Start Date:  05/10/2020                        Expected End Date:  03/10/2021                Follow-Up Date:  12/28/2020 at 10:00am  Patient Goals/Self-Care Activities: Receive weekly/bi-weekly calls from LCSW in an effort to coordinate long-term care skilled nursing facility placement.   Contact LCSW directly (# [phone]) if you have questions or if additional social work needs are identified. Continue to receive 4 hours of in-home care services per day, 7 days per week, through the [facility] and Attendance Program, until bed is available at [facility]. Department of [employer] and [employer], scheduled for November of 2022.          Patient verbalizes understanding of instructions provided today and agrees to view in [place].   Telephone follow-up appointment with care management team member scheduled for:  12/28/2020 at 10:00am  [place] Licensed Clinical Social Worker [place]  [phone]

## 2020-12-14 NOTE — Chronic Care Management (AMB) (Signed)
Chronic Care Management    Clinical Social Work Note  12/14/2020 Name: Corey Todd MRN: [id number] DOB: 04-10-1928  Corey Todd is a 87 y.o. year old male who is a primary care patient of [name], [name], MD. The CCM team was consulted to assist the patient with chronic disease management and/or care coordination needs related to: Level of Care Concerns.   Engaged with patient and wife by telephone for follow-up visit in response to provider referral for social work chronic care management and care coordination services.   Consent to Services:  The patient was given information about Chronic Care Management services, agreed to services, and gave verbal consent prior to initiation of services.  Please see initial visit note for detailed documentation.   Patient agreed to services and consent obtained.   Assessment: Review of patient past medical history, allergies, medications, and health status, including review of relevant consultants reports was performed today as part of a comprehensive evaluation and provision of chronic care management and care coordination services.     SDOH (Social Determinants of Health) assessments and interventions performed:  SDOH Interventions    Flowsheet Row Most Recent Value  SDOH Interventions   Food Insecurity Interventions Intervention Not Indicated, Other (Comment)  [Verified by Wife - Louise Dennie]  Financial Strain Interventions Intervention Not Indicated, Other (Comment)  [Verified by Wife - [place] Interventions Intervention Not Indicated, Other (Comment)  [Verified by Wife - Louise Fichter]  Intimate Partner Violence Interventions Intervention Not Indicated, Other (Comment)  [Verified by Wife - Louise Fugate]  Physical Activity Interventions Intervention Not Indicated, Other (Comments)  [Verified by Wife - Louise Guin]  Stress Interventions Intervention Not Indicated, Other (Comment)  [Verified by Wife - Louise  [place]riola]  Social Connections Interventions Intervention Not Indicated, Other (Comment)  [Verified by Wife - Louise Moroni]  Transportation Interventions Intervention Not Indicated, Other (Comment)  [Verified by Wife - Louise Smouse]        Advanced Directives Status: Not addressed in this encounter.  CCM Care Plan  No Known Allergies  Outpatient Encounter Medications as of 12/14/2020  Medication Sig   furosemide (LASIX) 20 MG tablet TAKE 1/2 TABLET EVERY DAY AS NEEDED FOR FLUID OR EDEMA   levothyroxine (SYNTHROID) 50 MCG tablet Take 1 tablet (50 mcg total) by mouth daily.   Magnesium Oxide 250 MG TABS Take 1 tablet (250 mg total) by mouth every evening.   Multiple Vitamins-Minerals (CEROVITE PO) Take 1 tablet by mouth daily. (Patient not taking: No sig reported)   QUEtiapine (SEROQUEL) 25 MG tablet Take 3 tablets (75 mg total) by mouth at bedtime.   UNABLE TO FIND Diet Type: Regular (Patient not taking: Reported on 12/01/2020)   No facility-administered encounter medications on file as of 12/14/2020.    Patient Active Problem List   Diagnosis Date Noted   Sundowning 05/02/2020   Cerumen impaction 12/23/2019   Acquired hypothyroidism 12/23/2019   Constipation 09/29/2018   Vascular dementia with behavioral disturbance ([place]) 04/20/2018   Incontinence of urine 04/20/2018   Iron deficiency anemia 12/15/2017   Hospital discharge follow-up 12/15/2017   Bilateral lower extremity edema 11/25/2017   Closed right hip fracture, sequela 11/14/2017   Right leg swelling 10/17/2017   Proximal leg weakness 10/17/2017   Herpes zoster without complication [id number]   Encounter for preventive health examination 02/14/2017   Skin lesion of face 09/09/2015   Counseling regarding advanced directives and goals of care 12/26/2014   Do not resuscitate discussion 12/26/2014  Hypertension 08/07/2014   Encounter for Medicare annual wellness exam 11/18/2012   GERD (gastroesophageal reflux disease)  05/09/2012   Obesity (BMI 30-39.9) 05/09/2012   Presbyacusis    BPH (benign prostatic hyperplasia)     Conditions to be addressed/monitored: Dementia. Limited Social Support, Level of Care Concerns, ADL/IADL Limitations, Limited Access to Caregiver, Cognitive Deficits, Memory Deficits, and Lacks Knowledge of [employer].  Care Plan : LCSW Plan of Care  Updates made by Francis Gaines, LCSW since 12/14/2020 12:00 AM     Problem: Improve My Quality of Life through [place] [facility].   Priority: High     Long-Range Goal: Improve My Quality of Life through [place] [facility].   Start Date: 05/10/2020  Expected End Date: 03/10/2021  This Visit's Progress: On track  Recent Progress: On track  Priority: High  Note:   Current Barriers:  Chronic disease management support, education, resources, referrals and care management needs related to Mobility Issues, Fall Risk, Hypertension, Weakness, Fatigue and Vascular Dementia with Behavioral Disturbance. ADL/IADL limitations and inability to self-administer prescription medications due to Vascular Dementia.   Clinical Goal(s):  LCSW will assist patient with obtaining long-term care placement into a skilled nursing facility of choice. LCSW Interventions:  Patient and wife interviewed and appropriate assessments performed. Inter-disciplinary care team collaboration (see longitudinal plan of care). Collaboration with Primary Care Physician, Dr. Deborra Medina regarding development and update of comprehensive plan of care as evidenced by provider attestation and co-signature. Level of care options explored with patient and wife. Collaboration with the Admissions Department at [facility]. Department of [employer] and [place] to ensure receipt and processing of paperwork for admission. Collaboration with Primary Care Physician, Dr. Deborra Medina to obtain a completed and signed FL-2  Form.   Once completed and signed FL-2 Form is obtained, LCSW will fax to the Admissions Department at [facility]. Department of [employer] and [employer]. Patient Goals/Self-Care Activities: Receive weekly/bi-weekly calls from LCSW in an effort to coordinate long-term care skilled nursing facility placement.   Contact LCSW directly (# [phone]) if you have questions or if additional social work needs are identified. Continue to receive 4 hours of in-home care services per day, 7 days per week, through the [facility] and Attendance Program, until bed is available at [facility]. Department of [employer] and [employer], scheduled for November of 2022. Follow-Up Plan:  LCSW will make contact with patient's wife on 12/28/2020 at 10:00am        Follow-Up Plan:  12/28/2020 at 10:00am  [place] Licensed Clinical Social Worker [place]  [phone]

## 2020-12-22 ENCOUNTER — Ambulatory Visit (INDEPENDENT_AMBULATORY_CARE_PROVIDER_SITE_OTHER): Payer: Medicare Other | Admitting: *Deleted

## 2020-12-22 DIAGNOSIS — E669 Obesity, unspecified: Secondary | ICD-10-CM

## 2020-12-22 DIAGNOSIS — I10 Essential (primary) hypertension: Secondary | ICD-10-CM

## 2020-12-22 DIAGNOSIS — R600 Localized edema: Secondary | ICD-10-CM

## 2020-12-22 DIAGNOSIS — F01518 Vascular dementia, unspecified severity, with other behavioral disturbance: Secondary | ICD-10-CM

## 2020-12-22 DIAGNOSIS — F0151 Vascular dementia with behavioral disturbance: Secondary | ICD-10-CM

## 2020-12-22 DIAGNOSIS — F05 Delirium due to known physiological condition: Secondary | ICD-10-CM

## 2020-12-22 NOTE — Chronic Care Management (AMB) (Signed)
Chronic Care Management    Clinical Social Work Note  12/22/2020 Name: Corey Todd MRN: [id number] DOB: 02-Sep-1927  Corey Todd is a 87 y.o. year old male who is a primary care patient of [name], [name], MD. The CCM team was consulted to assist the patient with chronic disease management and/or care coordination needs related to: Level of Care Concerns.   Engaged with patient's wife by telephone for follow-up visit in response to provider referral for social work chronic care management and care coordination services.   Consent to Services:  The patient was given information about Chronic Care Management services, agreed to services, and gave verbal consent prior to initiation of services.  Please see initial visit note for detailed documentation.   Patient agreed to services and consent obtained.   Assessment: Review of patient past medical history, allergies, medications, and health status, including review of relevant consultants reports was performed today as part of a comprehensive evaluation and provision of chronic care management and care coordination services.     SDOH (Social Determinants of Health) assessments and interventions performed:    Advanced Directives Status: Not addressed in this encounter.  CCM Care Plan  No Known Allergies  Outpatient Encounter Medications as of 12/22/2020  Medication Sig   furosemide (LASIX) 20 MG tablet TAKE 1/2 TABLET EVERY DAY AS NEEDED FOR FLUID OR EDEMA   levothyroxine (SYNTHROID) 50 MCG tablet Take 1 tablet (50 mcg total) by mouth daily.   Magnesium Oxide 250 MG TABS Take 1 tablet (250 mg total) by mouth every evening.   Multiple Vitamins-Minerals (CEROVITE PO) Take 1 tablet by mouth daily. (Patient not taking: No sig reported)   QUEtiapine (SEROQUEL) 25 MG tablet Take 3 tablets (75 mg total) by mouth at bedtime.   UNABLE TO FIND Diet Type: Regular (Patient not taking: Reported on 12/01/2020)   No facility-administered encounter  medications on file as of 12/22/2020.    Patient Active Problem List   Diagnosis Date Noted   Sundowning 05/02/2020   Cerumen impaction 12/23/2019   Acquired hypothyroidism 12/23/2019   Constipation 09/29/2018   Vascular dementia with behavioral disturbance ([place]) 04/20/2018   Incontinence of urine 04/20/2018   Iron deficiency anemia 12/15/2017   Hospital discharge follow-up 12/15/2017   Bilateral lower extremity edema 11/25/2017   Closed right hip fracture, sequela 11/14/2017   Right leg swelling 10/17/2017   Proximal leg weakness 10/17/2017   Herpes zoster without complication 27/06/5007   Encounter for preventive health examination 02/14/2017   Skin lesion of face 09/09/2015   Counseling regarding advanced directives and goals of care 12/26/2014   Do not resuscitate discussion 12/26/2014   Hypertension 08/07/2014   Encounter for Medicare annual wellness exam 11/18/2012   GERD (gastroesophageal reflux disease) 05/09/2012   Obesity (BMI 30-39.9) 05/09/2012   Presbyacusis    BPH (benign prostatic hyperplasia)     Conditions to be addressed/monitored: HTN and Dementia.  Level of Care Concerns, ADL/IADL Limitations, Limited Access to Caregiver, Cognitive Deficits, Memory Deficits, and Lacks Knowledge of [employer].  Care Plan : LCSW Plan of Care  Updates made by Francis Gaines, LCSW since 12/22/2020 12:00 AM     Problem: Improve My Quality of Life through [place] [facility].   Priority: High     Long-Range Goal: Improve My Quality of Life through [place] [facility].   Start Date: 05/10/2020  Expected End Date: 03/10/2021  This Visit's Progress: On track  Recent Progress: On track  Priority:  High  Note:   Current Barriers:  Chronic disease management support, education, resources, referrals and care management needs related to Mobility Issues, Fall Risk, Hypertension, Weakness, Fatigue and Vascular Dementia with Behavioral  Disturbance. ADL/IADL limitations and inability to self-administer prescription medications due to Vascular Dementia.   Clinical Goal(s):  LCSW will assist patient with obtaining long-term care placement into a skilled nursing facility of choice. LCSW Interventions:  Patient and wife interviewed and appropriate assessments performed. Inter-disciplinary care team collaboration (see longitudinal plan of care). Collaboration with Primary Care Physician, Dr. Deborra Medina regarding development and update of comprehensive plan of care as evidenced by provider attestation and co-signature. Level of care options explored with patient and wife. Collaboration with the Admissions Director, Kit [place] at the [facility] in [place], to ensure receipt and processing of application for admission. Collaboration with Primary Care Physician, Dr. Deborra Medina to obtain a completed and signed FL-2 Form.   Completed and signed FL-2 Form obtained and faxed to the Admissions Director at the [facility] in [place], [place].   Patient Goals/Self-Care Activities: Receive weekly/bi-weekly calls from LCSW in an effort to coordinate long-term care in a skilled nursing facility.   Contact LCSW directly (# [phone]) if you have questions, need assistance, or if additional social work needs are identified between now and our next scheduled telephone outreach call. Continue to receive 4 hours of in-home care services per day, 7 days per week, through the [facility] and [employer], until bed is available at the [facility] in [place].   LCSW will continue to try and make contact with [name] [phone]), Admissions Director at the [facility] in [place], to check the status of your application, confirm approval for long-term skilled nursing care,  and obtain an update on bed availability.   Follow-Up Plan:  LCSW will make contact with patient's wife on 12/29/2020 at 3:00pm        Follow-Up Plan:  12/29/2020 at 3:00pm      [place] Licensed Clinical Social Worker [place]  [phone]

## 2020-12-22 NOTE — Patient Instructions (Signed)
Visit Information  PATIENT GOALS:  Goals Addressed             This Visit's Progress    Improve My Quality of Life through [place] Placement.   On track    Timeframe:  Long-Range Goal Priority:  High Start Date:  05/10/2020                        Expected End Date:  03/10/2021                Follow-Up Date:  12/29/2020 at 3:00pm  Patient Goals/Self-Care Activities: Receive weekly/bi-weekly calls from LCSW in an effort to coordinate long-term care in a skilled nursing facility.   Contact LCSW directly (# [phone]) if you have questions, need assistance, or if additional social work needs are identified between now and our next scheduled telephone outreach call. Continue to receive 4 hours of in-home care services per day, 7 days per week, through the [employer] and [employer], until bed is available at the [facility] in [place].   LCSW will continue to try and make contact with [name] [phone]), Admissions Director at the [facility] in [place], to check the status of your application, confirm approval for long-term skilled nursing care, and obtain an update on bed availability.            Patient verbalizes understanding of instructions provided today and agrees to view in [place].   Telephone follow-up appointment with care management team member scheduled for:  12/29/2020 at 3:00pm  [place] Licensed Clinical Social Worker [place]  [phone]

## 2020-12-28 ENCOUNTER — Telehealth: Payer: Medicare Other

## 2020-12-29 ENCOUNTER — Ambulatory Visit: Payer: Medicare Other | Admitting: *Deleted

## 2020-12-29 DIAGNOSIS — E669 Obesity, unspecified: Secondary | ICD-10-CM

## 2020-12-29 DIAGNOSIS — R29898 Other symptoms and signs involving the musculoskeletal system: Secondary | ICD-10-CM

## 2020-12-29 DIAGNOSIS — F0151 Vascular dementia with behavioral disturbance: Secondary | ICD-10-CM

## 2020-12-29 DIAGNOSIS — N3942 Incontinence without sensory awareness: Secondary | ICD-10-CM

## 2020-12-29 DIAGNOSIS — R600 Localized edema: Secondary | ICD-10-CM

## 2020-12-29 DIAGNOSIS — E039 Hypothyroidism, unspecified: Secondary | ICD-10-CM

## 2020-12-29 DIAGNOSIS — F05 Delirium due to known physiological condition: Secondary | ICD-10-CM

## 2020-12-29 DIAGNOSIS — F01518 Vascular dementia, unspecified severity, with other behavioral disturbance: Secondary | ICD-10-CM

## 2020-12-29 DIAGNOSIS — I10 Essential (primary) hypertension: Secondary | ICD-10-CM

## 2020-12-29 NOTE — Patient Instructions (Signed)
Visit Information  PATIENT GOALS:  Goals Addressed             This Visit's Progress    Improve My Quality of Life through [place] Placement.   On track    Timeframe:  Long-Range Goal Priority:  High Start Date:  05/10/2020                        Expected End Date:  03/10/2021                Follow-Up Date:  01/05/2021 at 10:00am  Patient Goals/Self-Care Activities: Wife will receive weekly/bi-weekly calls from LCSW, in an effort to coordinate long-term care in a skilled nursing facility.   Wife has been instructed to contact LCSW directly (# [phone]), if she has questions, needs assistance, or if additional social work needs are identified between now and our next scheduled telephone outreach call. Continue to receive 4 hours of in-home care services per day, 7 days per week, through the [employer] and [employer], until bed is available at the [facility] in [place].   LCSW collaboration with [name] [phone]), Admissions Director at the [facility] in [place], to confirm approval for long-term skilled nursing facility placement.  Mrs. Glo Herring reported that they are still awaiting licensure from the [place] of [place], but hope to be open by mid-November.  Mrs. Vath is aware and in agreement.            Patient verbalizes understanding of instructions provided today and agrees to view in [place].   Telephone follow up appointment with care management team member scheduled for:  01/05/2021 at 10:00am  [place] Licensed Clinical Social Worker [place]  [phone]

## 2020-12-29 NOTE — Chronic Care Management (AMB) (Signed)
Chronic Care Management    Clinical Social Work Note  12/29/2020 Name: Corey Todd MRN: [id number] DOB: Oct 31, 1927  Corey Todd is a 87 y.o. year old male who is a primary care patient of [name], [name], MD. The CCM team was consulted to assist the patient with chronic disease management and/or care coordination needs related to: Level of Care Concerns and Caregiver Stress.   Engaged with patient's wife by telephone for follow-up visit in response to provider referral for social work chronic care management and care coordination services.   Consent to Services:  The patient was given information about Chronic Care Management services, agreed to services, and gave verbal consent prior to initiation of services.  Please see initial visit note for detailed documentation.   Patient agreed to services and consent obtained.   Assessment: Review of patient past medical history, allergies, medications, and health status, including review of relevant consultants reports was performed today as part of a comprehensive evaluation and provision of chronic care management and care coordination services.     SDOH (Social Determinants of Health) assessments and interventions performed:    Advanced Directives Status: Not addressed in this encounter.  CCM Care Plan  No Known Allergies  Outpatient Encounter Medications as of 12/29/2020  Medication Sig   furosemide (LASIX) 20 MG tablet TAKE 1/2 TABLET EVERY DAY AS NEEDED FOR FLUID OR EDEMA   levothyroxine (SYNTHROID) 50 MCG tablet Take 1 tablet (50 mcg total) by mouth daily.   Magnesium Oxide 250 MG TABS Take 1 tablet (250 mg total) by mouth every evening.   Multiple Vitamins-Minerals (CEROVITE PO) Take 1 tablet by mouth daily. (Patient not taking: No sig reported)   QUEtiapine (SEROQUEL) 25 MG tablet Take 3 tablets (75 mg total) by mouth at bedtime.   UNABLE TO FIND Diet Type: Regular (Patient not taking: Reported on 12/01/2020)   No  facility-administered encounter medications on file as of 12/29/2020.    Patient Active Problem List   Diagnosis Date Noted   Sundowning 05/02/2020   Cerumen impaction 12/23/2019   Acquired hypothyroidism 12/23/2019   Constipation 09/29/2018   Vascular dementia with behavioral disturbance ([place]) 04/20/2018   Incontinence of urine 04/20/2018   Iron deficiency anemia 12/15/2017   Hospital discharge follow-up 12/15/2017   Bilateral lower extremity edema 11/25/2017   Closed right hip fracture, sequela 11/14/2017   Right leg swelling 10/17/2017   Proximal leg weakness 10/17/2017   Herpes zoster without complication 78/29/5621   Encounter for preventive health examination 02/14/2017   Skin lesion of face 09/09/2015   Counseling regarding advanced directives and goals of care 12/26/2014   Do not resuscitate discussion 12/26/2014   Hypertension 08/07/2014   Encounter for Medicare annual wellness exam 11/18/2012   GERD (gastroesophageal reflux disease) 05/09/2012   Obesity (BMI 30-39.9) 05/09/2012   Presbyacusis    BPH (benign prostatic hyperplasia)     Conditions to be addressed/monitored: HTN and Dementia.  Caregiver Stress, Limited Social Support, Level of Care Concerns, ADL/IADL Limitations, Social Isolation, Limited Access to Caregiver, Cognitive Deficits, Memory Deficits, and Lacks Knowledge of [employer].  Care Plan : LCSW Plan of Care  Updates made by Francis Gaines, LCSW since 12/29/2020 12:00 AM     Problem: Improve My Quality of Life through [place] Placement.   Priority: High     Long-Range Goal: Improve My Quality of Life through [place] [facility]acement.   Start Date: 05/10/2020  Expected End Date: 03/10/2021  This Visit's  Progress: On track  Recent Progress: On track  Priority: High  Note:   Current Barriers:  Chronic disease management support, education, resources, referrals and care management needs related to Mobility  Issues, Fall Risk, Hypertension, Weakness, Fatigue and Vascular Dementia with Behavioral Disturbance. ADL/IADL limitations and inability to self-administer prescription medications due to Vascular Dementia.   Clinical Goal(s):  LCSW will assist patient with obtaining long-term care placement into a skilled nursing facility of choice. LCSW Interventions:  Patient and wife interviewed and appropriate assessments performed. Inter-disciplinary care team collaboration (see longitudinal plan of care). Collaboration with Primary Care Physician, Dr. Deborra Medina regarding development and update of comprehensive plan of care as evidenced by provider attestation and co-signature. Collaboration with the Admissions Director, Kit [place] at the [facility] in [place], to ensure receipt and processing of application for admission. Patient Goals/Self-Care Activities: Wife will receive weekly/bi-weekly calls from LCSW, in an effort to coordinate long-term care in a skilled nursing facility.   Wife has been instructed to contact LCSW directly (# [phone]), if she has questions, needs assistance, or if additional social work needs are identified between now and our next scheduled telephone outreach call. Continue to receive 4 hours of in-home care services per day, 7 days per week, through the [employer] and [employer], until bed is available at the [facility] in [place].   LCSW collaboration with [name] [phone]), Admissions Director at the [facility] in [place], to confirm approval for long-term skilled nursing facility placement.   Mrs. Glo Herring reported that they are still awaiting licensure from the [place], but hope to be open by mid-November.  Mrs. Fullbright is aware and in agreement.   Follow-Up Plan:  LCSW will make contact with  patient's wife on 01/05/2021 at 10:00am        Follow-Up Plan: 01/05/2021 at 10:00am      [place] Licensed Clinical Social Worker [place]  [phone]

## 2021-01-05 ENCOUNTER — Ambulatory Visit: Payer: Medicare Other | Admitting: *Deleted

## 2021-01-05 DIAGNOSIS — F05 Delirium due to known physiological condition: Secondary | ICD-10-CM

## 2021-01-05 DIAGNOSIS — I10 Essential (primary) hypertension: Secondary | ICD-10-CM

## 2021-01-05 DIAGNOSIS — E669 Obesity, unspecified: Secondary | ICD-10-CM

## 2021-01-05 DIAGNOSIS — F01518 Vascular dementia, unspecified severity, with other behavioral disturbance: Secondary | ICD-10-CM

## 2021-01-05 DIAGNOSIS — F0151 Vascular dementia with behavioral disturbance: Secondary | ICD-10-CM

## 2021-01-05 DIAGNOSIS — Z7189 Other specified counseling: Secondary | ICD-10-CM

## 2021-01-05 NOTE — Chronic Care Management (AMB) (Signed)
Chronic Care Management    Clinical Social Work Note  01/05/2021 Name: Corey Todd MRN: [id number] DOB: 1927-05-20  Corey Todd is a 87 y.o. year old male who is a primary care patient of [name], [name], MD. The CCM team was consulted to assist the patient with chronic disease management and/or care coordination needs related to: Level of Care Concerns and Caregiver Stress.   Engaged with patient's wife by telephone for follow-up visit in response to provider referral for social work chronic care management and care coordination services.   Consent to Services:  The patient was given information about Chronic Care Management services, agreed to services, and gave verbal consent prior to initiation of services.  Please see initial visit note for detailed documentation.   Patient agreed to services and consent obtained.   Assessment: Review of patient past medical history, allergies, medications, and health status, including review of relevant consultants reports was performed today as part of a comprehensive evaluation and provision of chronic care management and care coordination services.     SDOH (Social Determinants of Health) assessments and interventions performed:    Advanced Directives Status: Not addressed in this encounter.  CCM Care Plan  No Known Allergies  Outpatient Encounter Medications as of 01/05/2021  Medication Sig   furosemide (LASIX) 20 MG tablet TAKE 1/2 TABLET EVERY DAY AS NEEDED FOR FLUID OR EDEMA   levothyroxine (SYNTHROID) 50 MCG tablet Take 1 tablet (50 mcg total) by mouth daily.   Magnesium Oxide 250 MG TABS Take 1 tablet (250 mg total) by mouth every evening.   Multiple Vitamins-Minerals (CEROVITE PO) Take 1 tablet by mouth daily. (Patient not taking: No sig reported)   QUEtiapine (SEROQUEL) 25 MG tablet Take 3 tablets (75 mg total) by mouth at bedtime.   UNABLE TO FIND Diet Type: Regular (Patient not taking: Reported on 12/01/2020)   No  facility-administered encounter medications on file as of 01/05/2021.    Patient Active Problem List   Diagnosis Date Noted   Sundowning 05/02/2020   Cerumen impaction 12/23/2019   Acquired hypothyroidism 12/23/2019   Constipation 09/29/2018   Vascular dementia with behavioral disturbance ([place]) 04/20/2018   Incontinence of urine 04/20/2018   Iron deficiency anemia 12/15/2017   Hospital discharge follow-up 12/15/2017   Bilateral lower extremity edema 11/25/2017   Closed right hip fracture, sequela 11/14/2017   Right leg swelling 10/17/2017   Proximal leg weakness 10/17/2017   Herpes zoster without complication 27/51/7001   Encounter for preventive health examination 02/14/2017   Skin lesion of face 09/09/2015   Counseling regarding advanced directives and goals of care 12/26/2014   Do not resuscitate discussion 12/26/2014   Hypertension 08/07/2014   Encounter for Medicare annual wellness exam 11/18/2012   GERD (gastroesophageal reflux disease) 05/09/2012   Obesity (BMI 30-39.9) 05/09/2012   Presbyacusis    BPH (benign prostatic hyperplasia)     Conditions to be addressed/monitored:  Vascular Dementia.  Limited Social Support, Level of Care Concerns, ADL/IADL Limitations, Social Isolation, Limited Access to Caregiver, Cognitive Deficits, Memory Deficits, and Lacks Knowledge of [employer].  Care Plan : LCSW Plan of Care  Updates made by Francis Gaines, LCSW since 01/05/2021 12:00 AM     Problem: Improve My Quality of Life through [place] Placement.   Priority: High     Long-Range Goal: Improve My Quality of Life through [place] Placement.   Start Date: 05/10/2020  Expected End Date: 03/10/2021  This Visit's Progress: On  track  Recent Progress: On track  Priority: High  Note:   Current Barriers:  Chronic disease management support, education, resources, referrals and care management needs related to Mobility Issues, Fall Risk,  Hypertension, Weakness, Fatigue and Vascular Dementia with Behavioral Disturbance.  Clinical Goal(s):  LCSW will assist patient and wife with obtaining long-term care placement into a skilled nursing facility of choice. LCSW Interventions:  Collaboration with Primary Care Physician, Dr. Deborra Medina regarding development and update of comprehensive plan of care as evidenced by provider attestation and co-signature. Collaboration with the Admissions Director, Kit [place] at the [facility] in [place], to ensure receipt and processing of application for admission. Patient Goals/Self-Care Activities: Wife will receive weekly/bi-weekly calls from LCSW, in an effort to coordinate long-term care placement for her husband in a skilled nursing facility.   Wife has been instructed to contact LCSW directly (# [phone]), if she has questions, needs assistance, or if additional social work needs are identified between now and our next scheduled telephone outreach call. Wife and LCSW will continue to collaborate with [name] [phone]), Admissions Director at the [facility] in [place], to determine move-in date for patient's husband.  Wife will review list of skilled nursing facilities in [place] and [facility], provided by [employer], and consider alternate placement arrangements for her husband, in the event that the [facility] in [place] does not open until 2023. Follow-Up Plan:  LCSW will make contact with patient's wife on 01/16/2021 at 9:45am           [place] Clinical Social Worker [place]  [phone]

## 2021-01-05 NOTE — Patient Instructions (Signed)
Visit Information  PATIENT GOALS:  Goals Addressed             This Visit's Progress    Improve My Quality of Life through [place] Placement.   On track    Timeframe:  Long-Range Goal Priority:  High Start Date:  05/10/2020                        Expected End Date:  03/10/2021                Follow-Up Date:  01/16/2021 at 9:45am  Patient Goals/Self-Care Activities: Wife will receive weekly/bi-weekly calls from LCSW, in an effort to coordinate long-term care placement for her husband in a skilled nursing facility.   Wife has been instructed to contact LCSW directly (# [phone]), if she has questions, needs assistance, or if additional social work needs are identified between now and our next scheduled telephone outreach call. Wife and LCSW will continue to collaborate with [name] [phone]), Admissions Director at the [facility] in [place], to determine move-in date for patient's husband.  Wife will review list of skilled nursing facilities in [place] and [facility], provided by [employer], and consider alternate placement arrangements for her husband, in the event that the [facility] in [place] does not open until 2023.        Patient verbalizes understanding of instructions provided today and agrees to view in [place].   Telephone follow up appointment with care management team member scheduled for:  01/16/2021 at 9:45am  [place] Licensed Clinical Social Worker [place]  [phone]

## 2021-01-06 ENCOUNTER — Other Ambulatory Visit: Payer: Self-pay | Admitting: Internal Medicine

## 2021-01-13 DIAGNOSIS — E039 Hypothyroidism, unspecified: Secondary | ICD-10-CM

## 2021-01-13 DIAGNOSIS — F0151 Vascular dementia with behavioral disturbance: Secondary | ICD-10-CM

## 2021-01-13 DIAGNOSIS — I10 Essential (primary) hypertension: Secondary | ICD-10-CM

## 2021-01-13 DIAGNOSIS — F05 Delirium due to known physiological condition: Secondary | ICD-10-CM | POA: Diagnosis not present

## 2021-01-16 ENCOUNTER — Ambulatory Visit (INDEPENDENT_AMBULATORY_CARE_PROVIDER_SITE_OTHER): Payer: Medicare Other | Admitting: *Deleted

## 2021-01-16 DIAGNOSIS — R600 Localized edema: Secondary | ICD-10-CM

## 2021-01-16 DIAGNOSIS — F05 Delirium due to known physiological condition: Secondary | ICD-10-CM

## 2021-01-16 DIAGNOSIS — I10 Essential (primary) hypertension: Secondary | ICD-10-CM

## 2021-01-16 DIAGNOSIS — E669 Obesity, unspecified: Secondary | ICD-10-CM

## 2021-01-16 DIAGNOSIS — F01518 Vascular dementia, unspecified severity, with other behavioral disturbance: Secondary | ICD-10-CM

## 2021-01-16 NOTE — Chronic Care Management (AMB) (Signed)
Chronic Care Management    Clinical Social Work Note  01/16/2021 Name: Corey Todd MRN: [id number] DOB: [birth year]  Corey Todd is a 87 y.o. year old male who is a primary care patient of [name], [name], MD. The CCM team was consulted to assist the patient with chronic disease management and/or care coordination needs related to: Level of Care Concerns and Caregiver Stress.   Engaged with patient's wife by telephone for follow-up visit in response to provider referral for social work chronic care management and care coordination services.   Consent to Services:  The patient was given information about Chronic Care Management services, agreed to services, and gave verbal consent prior to initiation of services.  Please see initial visit note for detailed documentation.   Patient agreed to services and consent obtained.   Assessment: Review of patient past medical history, allergies, medications, and health status, including review of relevant consultants reports was performed today as part of a comprehensive evaluation and provision of chronic care management and care coordination services.     SDOH (Social Determinants of Health) assessments and interventions performed:    Advanced Directives Status: Not addressed in this encounter.  CCM Care Plan  No Known Allergies  Outpatient Encounter Medications as of 01/16/2021  Medication Sig   furosemide (LASIX) 20 MG tablet TAKE 1/2 TABLET EVERY DAY AS NEEDED FOR FLUID OR EDEMA   levothyroxine (SYNTHROID) 50 MCG tablet Take 1 tablet (50 mcg total) by mouth daily.   Magnesium Oxide 250 MG TABS Take 1 tablet (250 mg total) by mouth every evening.   Multiple Vitamins-Minerals (CEROVITE PO) Take 1 tablet by mouth daily. (Patient not taking: No sig reported)   QUEtiapine (SEROQUEL) 25 MG tablet TAKE ONE TABLET BY MOUTH AT BEDTIME. MAYINCREASE TO 2 TABLETS AFTER 1 WEEK IF NEEDED.   UNABLE TO FIND Diet Type: Regular (Patient not taking:  Reported on 12/01/2020)   No facility-administered encounter medications on file as of 01/16/2021.    Patient Active Problem List   Diagnosis Date Noted   Sundowning 05/02/2020   Cerumen impaction 12/23/2019   Acquired hypothyroidism 12/23/2019   Constipation 09/29/2018   Vascular dementia with behavioral disturbance 04/20/2018   Incontinence of urine 04/20/2018   Iron deficiency anemia 12/15/2017   Hospital discharge follow-up 12/15/2017   Bilateral lower extremity edema 11/25/2017   Closed right hip fracture, sequela 11/14/2017   Right leg swelling 10/17/2017   Proximal leg weakness 10/17/2017   Herpes zoster without complication 28/41/3244   Encounter for preventive health examination 02/14/2017   Skin lesion of face 09/09/2015   Counseling regarding advanced directives and goals of care 12/26/2014   Do not resuscitate discussion 12/26/2014   Hypertension 08/07/2014   Encounter for Medicare annual wellness exam 11/18/2012   GERD (gastroesophageal reflux disease) 05/09/2012   Obesity (BMI 30-39.9) 05/09/2012   Presbyacusis    BPH (benign prostatic hyperplasia)     Conditions to be addressed/monitored: Dementia.  Limited Social Support, Level of Care Concerns, ADL/IADL Limitations, Social Isolation, Limited Access to Caregiver, Cognitive Deficits, Memory Deficits, and Lacks Knowledge of [employer].  Care Plan : LCSW Plan of Care  Updates made by Francis Gaines, LCSW since 01/16/2021 12:00 AM     Problem: Improve My Quality of Life through [place] Placement.   Priority: High     Long-Range Goal: Improve My Quality of Life through [place] Placement.   Start Date: 05/10/2020  Expected End Date: 03/10/2021  This  Visit's Progress: On track  Recent Progress: On track  Priority: High  Note:   Current Barriers:  Chronic disease management support, education, resources, referrals and care management needs related to Mobility Issues,  Fall Risk, Hypertension, Weakness, Fatigue and Vascular Dementia with Behavioral Disturbance.  Clinical Goal(s):  LCSW will assist patient and wife with obtaining long-term care placement into a skilled nursing facility of choice. LCSW Interventions:  Collaboration with Primary Care Physician, Dr. Teresa Tullo regarding development and update of comprehensive plan of care as evidenced by provider attestation and co-signature. Collaboration with the Admissions Director, Kit [place] at the [facility] in [place], to inquire about state licensure and to obtain a tentative start-of-service date.   Patient Goals/Self-Care Activities: Mrs. Kilty will receive weekly/bi-weekly calls from LCSW, in an effort to coordinate long-term care placement for patient in a skilled nursing facility.   Mrs. Rowser and LCSW will continue to collaborate with Kit Ferguson (# [phone]), Admissions Coordinator at the [facility] in [place], to inquire about issuance of state licensure and obtain a tentative move-in date. Mrs. [place] agreed to contact LCSW directly (# [phone]), if she has questions, needs assistance, or if additional social work needs are identified between now and our next scheduled telephone outreach call. Follow-Up Plan:  LCSW will make contact with patient's wife on 01/30/2021 at 9:45am       [place]CS[place] Licensed Clinical Social Worker [facility] [place]  [phone]   

## 2021-01-16 NOTE — Patient Instructions (Signed)
Visit Information  PATIENT GOALS:  Goals Addressed             This Visit's Progress    Improve My Quality of Life through [place] Placement.   On track    Timeframe:  Long-Range Goal Priority:  High Start Date:  05/10/2020                        Expected End Date:  03/10/2021                Follow-Up Date:  01/30/2021 at 9:45am  Patient Goals/Self-Care Activities: Mrs. Ferreri will receive weekly/bi-weekly calls from LCSW, in an effort to coordinate long-term care placement for patient in a skilled nursing facility.   Mrs. Ulys[place] and LCSW will continue to collaborate with [name] [phone]), Admissions Coordinator at the [facility] in [place], to inquire about issuance of state licensure and obtain a tentative move-in date. Mrs. Breeze agreed to contact LCSW directly (# [phone]), if she has questions, needs assistance, or if additional social work needs are identified between now and our next scheduled telephone outreach call.        Patient verbalizes understanding of instructions provided today and agrees to view in [place].   Telephone follow up appointment with care management team member scheduled for:  01/30/2021 at 9:45am  [place] Licensed Clinical Social Worker [place]  [phone]

## 2021-01-23 ENCOUNTER — Telehealth: Payer: Self-pay | Admitting: Internal Medicine

## 2021-01-23 NOTE — Telephone Encounter (Signed)
Patient's feet are swollen and would like his furosemide (LASIX) 20 MG tablet increased.

## 2021-01-23 NOTE — Telephone Encounter (Signed)
Doses higher than 20 mg daily of lasix can cause dehydration and kidney failure and is not the right treatment for swollen feet.  Needs to elevate legs when sitting Needs to wear compression stocking  Please schedule follow up in person

## 2021-01-24 NOTE — Telephone Encounter (Signed)
Patient has been notified. Appointment has been scheduled.

## 2021-01-30 ENCOUNTER — Ambulatory Visit: Payer: Medicare Other | Admitting: *Deleted

## 2021-01-30 DIAGNOSIS — F05 Delirium due to known physiological condition: Secondary | ICD-10-CM

## 2021-01-30 DIAGNOSIS — N3942 Incontinence without sensory awareness: Secondary | ICD-10-CM

## 2021-01-30 DIAGNOSIS — Z7189 Other specified counseling: Secondary | ICD-10-CM

## 2021-01-30 DIAGNOSIS — F01518 Vascular dementia, unspecified severity, with other behavioral disturbance: Secondary | ICD-10-CM

## 2021-01-30 DIAGNOSIS — E669 Obesity, unspecified: Secondary | ICD-10-CM

## 2021-01-30 DIAGNOSIS — R29898 Other symptoms and signs involving the musculoskeletal system: Secondary | ICD-10-CM

## 2021-01-30 DIAGNOSIS — I10 Essential (primary) hypertension: Secondary | ICD-10-CM

## 2021-01-30 NOTE — Chronic Care Management (AMB) (Signed)
Chronic Care Management    Clinical Social Work Note  01/30/2021 Name: Corey Todd MRN: [id number] DOB: [birth year]  Corey Todd is a 87 y.o. year old male who is a primary care patient of [name], [name], MD. The CCM team was consulted to assist the patient with chronic disease management and/or care coordination needs related to: Level of Care Concerns and Caregiver Stress.   Engaged with patient's wife by telephone for follow-up visit in response to provider referral for social work chronic care management and care coordination services.   Consent to Services:  The patient was given information about Chronic Care Management services, agreed to services, and gave verbal consent prior to initiation of services.  Please see initial visit note for detailed documentation.   Patient agreed to services and consent obtained.   Assessment: Review of patient past medical history, allergies, medications, and health status, including review of relevant consultants reports was performed today as part of a comprehensive evaluation and provision of chronic care management and care coordination services.     SDOH (Social Determinants of Health) assessments and interventions performed:    Advanced Directives Status: Not addressed in this encounter.  CCM Care Plan  No Known Allergies  Outpatient Encounter Medications as of 01/30/2021  Medication Sig   furosemide (LASIX) 20 MG tablet TAKE 1/2 TABLET EVERY DAY AS NEEDED FOR FLUID OR EDEMA   levothyroxine (SYNTHROID) 50 MCG tablet Take 1 tablet (50 mcg total) by mouth daily.   Magnesium Oxide 250 MG TABS Take 1 tablet (250 mg total) by mouth every evening.   Multiple Vitamins-Minerals (CEROVITE PO) Take 1 tablet by mouth daily. (Patient not taking: No sig reported)   QUEtiapine (SEROQUEL) 25 MG tablet TAKE ONE TABLET BY MOUTH AT BEDTIME. MAYINCREASE TO 2 TABLETS AFTER 1 WEEK IF NEEDED.   UNABLE TO FIND Diet Type: Regular (Patient not taking:  Reported on 12/01/2020)   No facility-administered encounter medications on file as of 01/30/2021.    Patient Active Problem List   Diagnosis Date Noted   Sundowning 05/02/2020   Cerumen impaction 12/23/2019   Acquired hypothyroidism 12/23/2019   Constipation 09/29/2018   Vascular dementia with behavioral disturbance 04/20/2018   Incontinence of urine 04/20/2018   Iron deficiency anemia 12/15/2017   Hospital discharge follow-up 12/15/2017   Bilateral lower extremity edema 11/25/2017   Closed right hip fracture, sequela 11/14/2017   Right leg swelling 10/17/2017   Proximal leg weakness 10/17/2017   Herpes zoster without complication 81/27/5170   Encounter for preventive health examination 02/14/2017   Skin lesion of face 09/09/2015   Counseling regarding advanced directives and goals of care 12/26/2014   Do not resuscitate discussion 12/26/2014   Hypertension 08/07/2014   Encounter for Medicare annual wellness exam 11/18/2012   GERD (gastroesophageal reflux disease) 05/09/2012   Obesity (BMI 30-39.9) 05/09/2012   Presbyacusis    BPH (benign prostatic hyperplasia)     Conditions to be addressed/monitored: HTN, Dementia and Caregiver Stress.  Limited Social Support, Level of Care Concerns, ADL/IADL Limitations, Social Isolation, Limited Access to Caregiver, Cognitive Deficits, Memory Deficits, and Lacks Knowledge of [employer].  Care Plan : LCSW Plan of Care  Updates made by Francis Gaines, LCSW since 01/30/2021 12:00 AM     Problem: Improve My Quality of Life through [place] [facility].   Priority: High     Long-Range Goal: Improve My Quality of Life through [place] [facility].   Start Date: 05/10/2020  Expected End  Date: 03/10/2021  This Visit's Progress: On track  Recent Progress: On track  Priority: High  Note:   Current Barriers:  Chronic disease management support, education, resources, referrals and care management needs  related to Mobility Issues, Fall Risk, Hypertension, Weakness, Fatigue and Vascular Dementia with Behavioral Disturbance.  Clinical Goal(s):  LCSW will assist patient and wife with obtaining long-term care placement into a skilled nursing facility of choice. LCSW Interventions:  Collaboration with Primary Care Physician, Dr. Deborra Medina regarding development and update of comprehensive plan of care as evidenced by provider attestation and co-signature. Ongoing collaboration with [name], Admissions Director at the [facility] in [place], to inquire about state licensure date and approval, as well as to obtain a tentative move-in date.   Patient Goals/Self-Care Activities: Mrs. Kimberling will receive weekly/bi-weekly calls from LCSW, in an effort to coordinate long-term care placement for patient in a skilled nursing facility.   Mrs. Humble and LCSW will continue to collaborate with [name] [phone]), Admissions Director at the [facility] in [place], to inquire about issuance of state licensure and to obtain a tentative move-in date for patient. Confirmation from [place] that paperwork has been received and processed, and that patient meets criteria for admissions into the [facility] in [place], for long-term care services.  State Licensure has still not been issued, but hopeful for late December or early January of 2023.  [name] agreed to contact LCSW directly, as soon as she has additional information to report. LCSW collaboration with Estella Husk, LCSW with [facility] [phone]. # [phone]), to inquire about additional hours of Aid and Attendance Benefits for patient. Mrs. Dehaas agreed to contact LCSW directly (# [phone]), if she has questions, needs assistance, or if additional social work needs are identified between  now and our next scheduled telephone outreach call. Follow-Up Plan:  LCSW will make contact with patient's wife on 02/15/2021 at 9:45am     [place] Clinical Social Worker [place]  [phone]

## 2021-01-30 NOTE — Patient Instructions (Signed)
Visit Information  PATIENT GOALS:  Goals Addressed             This Visit's Progress    Improve My Quality of Life through [place] [facility].   On track    Timeframe:  Long-Range Goal Priority:  High Start Date:  05/10/2020                        Expected End Date:  03/10/2021                Follow-Up Date:  02/15/2021 at 9:45am  Patient Goals/Self-Care Activities: Mrs. Ledford will receive weekly/bi-weekly calls from LCSW, in an effort to coordinate long-term care placement for patient in a skilled nursing facility.   Mrs. Perrell and LCSW will continue to collaborate with [name] [phone]), Admissions Director at the [facility] in [place], to inquire about issuance of state licensure and to obtain a tentative move-in date for patient. Confirmation from [place] that paperwork has been received and processed, and that patient meets criteria for admissions into the [facility] in [place], for long-term care services.  State Licensure has still not been issued, but hopeful for late December or early January of 2023.  [name] agreed to contact LCSW directly, as soon as she has additional information to report. LCSW collaboration with Estella Husk, LCSW with [facility] [phone]. # [phone]), to inquire about additional hours of Aid and Attendance Benefits for patient. Mrs. Virden agreed to contact LCSW directly (# [phone]), if she has questions, needs assistance, or if additional social work needs are identified between now and our next scheduled telephone outreach call.        Patient verbalizes understanding of instructions provided today and agrees to view in [place].   Telephone follow up appointment with care management team member scheduled for:  02/15/2021 at 9:45am  [place] Licensed Clinical Social  Worker [place]  [phone]

## 2021-02-03 ENCOUNTER — Other Ambulatory Visit: Payer: Self-pay

## 2021-02-03 ENCOUNTER — Ambulatory Visit (INDEPENDENT_AMBULATORY_CARE_PROVIDER_SITE_OTHER): Payer: Medicare Other | Admitting: Internal Medicine

## 2021-02-03 VITALS — BP 122/74 | HR 91 | Temp 96.6°F | Ht 65.98 in | Wt 167.6 lb

## 2021-02-03 DIAGNOSIS — R600 Localized edema: Secondary | ICD-10-CM | POA: Diagnosis not present

## 2021-02-03 DIAGNOSIS — Z23 Encounter for immunization: Secondary | ICD-10-CM | POA: Diagnosis not present

## 2021-02-03 DIAGNOSIS — M7989 Other specified soft tissue disorders: Secondary | ICD-10-CM | POA: Diagnosis not present

## 2021-02-03 DIAGNOSIS — Z7189 Other specified counseling: Secondary | ICD-10-CM

## 2021-02-03 MED ORDER — FUROSEMIDE 20 MG PO TABS: ORAL_TABLET | ORAL | 1 refills | Status: AC

## 2021-02-03 NOTE — Progress Notes (Signed)
Subjective:  Patient ID: Corey Todd, male    DOB: 1927-12-18  Age: 87 y.o. MRN: [id number]  CC: The primary encounter diagnosis was Need for immunization against influenza. Diagnoses of Edema leg, Do not resuscitate discussion, and Right leg swelling were also pertinent to this visit.  HPI Corey Todd presents for  Chief Complaint  Patient presents with   Joint Swelling   This visit occurred during the SARS-CoV-2 public health emergency.  Safety protocols were in place, including screening questions prior to the visit, additional usage of staff PPE, and extensive cleaning of exam room while observing appropriate contact time as indicated for disinfecting solutions.   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  He is brought in by his wife for same.  He denies pain involving the foot, ankle and calf and denies shortness of breath .  He has lost 7 lb since August. . He will not  elevate legs and wont' wear compression stockings. (Cut the last ones off).    Outpatient Medications Prior to Visit  Medication Sig Dispense Refill   levothyroxine (SYNTHROID) 50 MCG tablet Take 1 tablet (50 mcg total) by mouth daily. 90 tablet 3   QUEtiapine (SEROQUEL) 25 MG tablet TAKE ONE TABLET BY MOUTH AT BEDTIME. MAYINCREASE TO 2 TABLETS AFTER 1 WEEK IF NEEDED. 90 tablet 5   furosemide (LASIX) 20 MG tablet TAKE 1/2 TABLET EVERY DAY AS NEEDED FOR FLUID OR EDEMA 30 tablet 0   Magnesium Oxide 250 MG TABS Take 1 tablet (250 mg total) by mouth every evening. (Patient not taking: Reported on 02/03/2021) 90 tablet 0   Multiple Vitamins-Minerals (CEROVITE PO) Take 1 tablet by mouth daily. (Patient not taking: No sig reported)     UNABLE TO FIND Diet Type: Regular (Patient not taking: No sig reported)     No facility-administered medications prior to visit.    Review of Systems;  Patient denies  headache, fevers, malaise, unintentional weight loss, skin rash, eye pain, sinus congestion and sinus pain, sore throat, dysphagia,  hemoptysis , cough, dyspnea, wheezing, chest pain, palpitations, orthopnea, edema, abdominal pain, nausea, melena, diarrhea, constipation, flank pain, dysuria, hematuria, urinary  Frequency, nocturia, numbness, tingling, seizures,  Focal weakness, Loss of consciousness,  Tremor, insomnia, depression, anxiety, and suicidal ideation.      Objective:  BP 122/74   Pulse 91   Temp (!) 96.6 F (35.9 C)   Ht 5' 5.98" (1.676 m)   Wt 167 lb 9.6 oz (76 kg)   SpO2 94%   BMI 27.06 kg/m   BP Readings from Last 3 Encounters:  02/03/21 122/74  12/01/20 126/74  12/23/19 120/66    Wt Readings from Last 3 Encounters:  02/03/21 167 lb 9.6 oz (76 kg)  12/01/20 174 lb 6 oz (79.1 kg)  12/23/19 194 lb 1.9 oz (88.1 kg)    General appearance: alert, cooperative ,  outgoing ,   appears stated age Ears: normal TM's and external ear canals both ears Throat: lips, mucosa, and tongue normal; teeth and gums normal Neck: no adenopathy, no carotid bruit, supple, symmetrical, trachea midline and thyroid not enlarged, symmetric, no tenderness/mass/nodules Back: symmetric, no curvature. ROM normal. No CVA tenderness. Lungs: clear to auscultation bilaterally Heart: regular rate and rhythm, S1, S2 normal, no murmur, click, rub or gallop Abdomen: soft, non-tender; bowel sounds normal; no masses,  no organomegaly Pulses: 2+ and symmetric  Skin: Skin color, texture, turgor normal. No rashes or lesions Lymph nodes: Cervical, supraclavicular, and axillary nodes normal.  Lab Results  Component Value Date   HGBA1C 5.4 05/26/2019   HGBA1C 5.6 10/16/2017   HGBA1C 5.4 12/26/2015    Lab Results  Component Value Date   CREATININE 1.07 12/23/2019   CREATININE 1.20 08/24/2019   CREATININE 1.20 05/26/2019    Lab Results  Component Value Date   WBC 8.4 12/23/2019   HGB 13.0 12/23/2019    HCT 39.3 12/23/2019   PLT 264.0 12/23/2019   GLUCOSE 88 12/23/2019   CHOL 146 11/21/2012   TRIG 108.0 11/21/2012   HDL 48.80 11/21/2012   LDLCALC 76 11/21/2012   ALT 10 12/23/2019   AST 13 12/23/2019   NA 140 12/23/2019   K 3.7 12/23/2019   CL 105 12/23/2019   CREATININE 1.07 12/23/2019   BUN 19 12/23/2019   CO2 27 12/23/2019   TSH 3.73 02/17/2020   INR 1.02 11/14/2017   HGBA1C 5.4 05/26/2019   MICROALBUR <0.7 06/26/2016    MR Brain Wo Contrast  Result Date: 06/06/2019 CLINICAL DATA:  Subacute head trauma with progressive cognitive decline. EXAM: MRI HEAD WITHOUT CONTRAST TECHNIQUE: Multiplanar, multiecho pulse sequences of the brain and surrounding structures were obtained without intravenous contrast. COMPARISON:  None. FINDINGS: BRAIN: No acute infarct, acute hemorrhage or extra-axial collection. Multifocal white matter hyperintensity, most commonly due to chronic ischemic microangiopathy. There is generalized atrophy without lobar predilection. Midline structures are normal. VASCULAR: Major flow voids are preserved. Susceptibility-sensitive sequences show no chronic microhemorrhage or superficial siderosis. SKULL AND UPPER CERVICAL SPINE: Normal calvarium and skull base. Visualized upper cervical spine and soft tissues are normal. SINUSES/ORBITS: No fluid levels or advanced mucosal thickening. No mastoid or middle ear effusion. Normal orbits. IMPRESSION: Generalized atrophy and findings of chronic small vessel disease without acute intracranial abnormality. Electronically Signed   By: Ulyses Jarred M.D.   On: 06/06/2019 21:07    Assessment & Plan:   Problem List Items Addressed This Visit     Do not resuscitate discussion    Per discussion last year with patient and wife.      Relevant Orders   DNR (Do Not Resuscitate)   Right leg swelling    Chronic , secondary to venous insufficiency.  Problematic management due to his dementia and unwillingness to wear a compression  sock or elevate legs.  Advised wfie not to increase his furosemide dose beyond 20 mg daily  .      Other Visit Diagnoses     Need for immunization against influenza    -  Primary   Relevant Orders   Flu Vaccine QUAD High Dose(Fluad) (Completed)   DNR (Do Not Resuscitate)   Edema leg       Relevant Medications   furosemide (LASIX) 20 MG tablet       I have changed Marca Ancona. Prevost's furosemide. I am also having him maintain his Multiple Vitamins-Minerals (CEROVITE PO), UNABLE TO FIND, levothyroxine, Magnesium Oxide, and QUEtiapine.  Meds ordered this encounter  Medications   furosemide (LASIX) 20 MG tablet    Sig: TAKE ONE  TABLET EVERY DAY AS NEEDED FOR FLUID OR EDEMA    Dispense:  90 tablet    Refill:  1    Medications Discontinued During This Encounter  Medication Reason   furosemide (LASIX) 20 MG tablet Reorder    Follow-up: No follow-ups on file.   Crecencio Mc, MD

## 2021-02-03 NOTE — Patient Instructions (Signed)
Corey Todd has swelling due to "chronic venous insufficiency."  This cannot be cured with fluid pills.  They will only make him dehdyrated .  Elevating the legs will help,  and massaging the leg in a direction TOWARD THE HEART  while the leg is elevated will help

## 2021-02-04 NOTE — Assessment & Plan Note (Signed)
Per discussion last year with patient and wife.

## 2021-02-04 NOTE — Assessment & Plan Note (Signed)
Chronic , secondary to venous insufficiency.  Problematic management due to his dementia and unwillingness to wear a compression sock or elevate legs.  Advised wfie not to increase his furosemide dose beyond 20 mg daily  .

## 2021-02-08 ENCOUNTER — Telehealth: Payer: Self-pay | Admitting: Internal Medicine

## 2021-02-10 MED ORDER — QUETIAPINE FUMARATE 25 MG PO TABS: 75.0000 mg | ORAL_TABLET | Freq: Every day | ORAL | 5 refills | Status: DC

## 2021-02-10 NOTE — Addendum Note (Signed)
Addended by: Crecencio Mc on: 02/10/2021 11:18 AM   Modules accepted: Orders

## 2021-02-10 NOTE — Telephone Encounter (Signed)
[name] called and said that Dr [name] prescribed patient QUEtiapine (SEROQUEL) 25 MG tablet and changed from taking 2 to 3 pills. Patient is out and pharmacy will not refill because pharmacy thinks patient should be taking 2 a day.

## 2021-02-13 DIAGNOSIS — I10 Essential (primary) hypertension: Secondary | ICD-10-CM

## 2021-02-13 DIAGNOSIS — F05 Delirium due to known physiological condition: Secondary | ICD-10-CM | POA: Diagnosis not present

## 2021-02-13 DIAGNOSIS — F01518 Vascular dementia, unspecified severity, with other behavioral disturbance: Secondary | ICD-10-CM | POA: Diagnosis not present

## 2021-02-15 ENCOUNTER — Ambulatory Visit (INDEPENDENT_AMBULATORY_CARE_PROVIDER_SITE_OTHER): Payer: Medicare Other | Admitting: *Deleted

## 2021-02-15 DIAGNOSIS — F01518 Vascular dementia, unspecified severity, with other behavioral disturbance: Secondary | ICD-10-CM

## 2021-02-15 DIAGNOSIS — E669 Obesity, unspecified: Secondary | ICD-10-CM

## 2021-02-15 DIAGNOSIS — R29898 Other symptoms and signs involving the musculoskeletal system: Secondary | ICD-10-CM

## 2021-02-15 DIAGNOSIS — R600 Localized edema: Secondary | ICD-10-CM

## 2021-02-15 DIAGNOSIS — M7989 Other specified soft tissue disorders: Secondary | ICD-10-CM

## 2021-02-15 DIAGNOSIS — I10 Essential (primary) hypertension: Secondary | ICD-10-CM

## 2021-02-15 DIAGNOSIS — F05 Delirium due to known physiological condition: Secondary | ICD-10-CM

## 2021-02-15 NOTE — Chronic Care Management (AMB) (Signed)
Chronic Care Management    Clinical Social Work Note  02/15/2021 Name: Corey Todd MRN: [id number] DOB: Oct 07, 1927  ANTAWAN MCHUGH is a 87 y.o. year old male who is a primary care patient of [name], [name], MD. The CCM team was consulted to assist the patient with chronic disease management and/or care coordination needs related to: [employer], Level of Care Concerns, and Caregiver Stress.   Engaged with patient's wife by telephone for follow-up visit in response to provider referral for social work chronic care management and care coordination services.   Consent to Services:  The patient was given information about Chronic Care Management services, agreed to services, and gave verbal consent prior to initiation of services.  Please see initial visit note for detailed documentation.   Patient agreed to services and consent obtained.   Assessment: Review of patient past medical history, allergies, medications, and health status, including review of relevant consultants reports was performed today as part of a comprehensive evaluation and provision of chronic care management and care coordination services.     SDOH (Social Determinants of Health) assessments and interventions performed:    Advanced Directives Status: Not addressed in this encounter.  CCM Care Plan  No Known Allergies  Outpatient Encounter Medications as of 02/15/2021  Medication Sig   furosemide (LASIX) 20 MG tablet TAKE ONE  TABLET EVERY DAY AS NEEDED FOR FLUID OR EDEMA   levothyroxine (SYNTHROID) 50 MCG tablet Take 1 tablet (50 mcg total) by mouth daily.   Magnesium Oxide 250 MG TABS Take 1 tablet (250 mg total) by mouth every evening. (Patient not taking: Reported on 02/03/2021)   Multiple Vitamins-Minerals (CEROVITE PO) Take 1 tablet by mouth daily. (Patient not taking: No sig reported)   QUEtiapine (SEROQUEL) 25 MG tablet Take 3 tablets (75 mg total) by mouth at bedtime.   UNABLE TO FIND Diet Type:  Regular (Patient not taking: No sig reported)   No facility-administered encounter medications on file as of 02/15/2021.    Patient Active Problem List   Diagnosis Date Noted   Sundowning 05/02/2020   Cerumen impaction 12/23/2019   Acquired hypothyroidism 12/23/2019   Constipation 09/29/2018   Vascular dementia with behavioral disturbance 04/20/2018   Incontinence of urine 04/20/2018   Iron deficiency anemia 12/15/2017   Hospital discharge follow-up 12/15/2017   Bilateral lower extremity edema 11/25/2017   Closed right hip fracture, sequela 11/14/2017   Right leg swelling 10/17/2017   Proximal leg weakness 10/17/2017   Herpes zoster without complication 67/03/4579   Encounter for preventive health examination 02/14/2017   Skin lesion of face 09/09/2015   Counseling regarding advanced directives and goals of care 12/26/2014   Do not resuscitate discussion 12/26/2014   Hypertension 08/07/2014   Encounter for Medicare annual wellness exam 11/18/2012   GERD (gastroesophageal reflux disease) 05/09/2012   Presbyacusis    BPH (benign prostatic hyperplasia)     Conditions to be addressed/monitored: HTN and Dementia.  Limited Social Support, Level of Care Concerns, ADL/IADL Limitations, Social Isolation, Limited Access to Caregiver, Cognitive Deficits, Memory Deficits, and Lacks Knowledge of [employer].  Care Plan : LCSW Plan of Care  Updates made by Francis Gaines, LCSW since 02/15/2021 12:00 AM     Problem: Improve My Quality of Life through [place] [facility].   Priority: High     Long-Range Goal: Improve My Quality of Life through [place] [facility].   Start Date: 05/10/2020  Expected End Date: 03/10/2021  This Visit's  Progress: On track  Recent Progress: On track  Priority: High  Note:   Current Barriers:  Chronic disease management support, education, resources, referrals and care management needs related to Mobility Issues, Fall  Risk, Hypertension, Weakness, Fatigue and Vascular Dementia with Behavioral Disturbance.  Clinical Goal(s):  LCSW will assist patient and wife with obtaining long-term care placement into a skilled nursing facility of choice. LCSW Interventions:  Collaboration with Primary Care Physician, Dr. Deborra Medina regarding development and update of comprehensive plan of care as evidenced by provider attestation and co-signature.   Patient Goals/Self-Care Activities: Mrs. Craighead will continue to receive weekly/bi-weekly calls from LCSW, in an effort to coordinate long-term care placement for patient in a skilled nursing facility.   Mrs. Martinson and LCSW will continue to collaborate with [name] [phone]), Admissions Director at the [facility] in [place], to inquire about patient's application status, as well as to ensure that patient meets criteria for long-term care.   According to [employer], patient does not meet criteria for long-term care placement at the [facility], but has been approved for 30 days of custodial care, allowing Mrs. Banfill 30 days of respite care.  Mrs. Glo Herring went on to explain that patient is only 40% service-related disabled, and long-term care requires 70%.  However, Mrs. Glo Herring indicated that patient may be eligible to receive additional  hours of in-home care through his Aid and Attendance Benefit, agreeing to inquire.  Last, Mrs. Glo Herring reported that patient would receive a  significant increase to his monthly [employer].  LCSW educated Mrs. Varas on long-term care placement process, explaining that before patient's 30 days of custodial care at the [facility] are terminated, assistance will be provided to transition patient into a long-term memory care assisted living facility. Mrs. Molinelli agreed to contact LCSW directly (# [phone]),  if she has questions, needs assistance, or if additional social work needs are identified between now and our next scheduled telephone outreach call. Follow-Up Plan:  LCSW will make contact with patient's wife on 02/24/2021 at 3:30pm     [place] Licensed Clinical Social Worker [place]  [phone]

## 2021-02-15 NOTE — Patient Instructions (Addendum)
Visit Information  Patient verbalizes understanding of instructions provided today and agrees to view in [place].   Telephone follow up appointment with care management team member scheduled for:  02/24/2021 at 3:30pm  [place] Licensed Clinical Social Worker [place]  [phone]

## 2021-02-24 ENCOUNTER — Ambulatory Visit: Payer: Medicare Other | Admitting: *Deleted

## 2021-02-24 DIAGNOSIS — F05 Delirium due to known physiological condition: Secondary | ICD-10-CM

## 2021-02-24 DIAGNOSIS — E669 Obesity, unspecified: Secondary | ICD-10-CM

## 2021-02-24 DIAGNOSIS — F01518 Vascular dementia, unspecified severity, with other behavioral disturbance: Secondary | ICD-10-CM

## 2021-02-24 DIAGNOSIS — R29898 Other symptoms and signs involving the musculoskeletal system: Secondary | ICD-10-CM

## 2021-02-24 DIAGNOSIS — R600 Localized edema: Secondary | ICD-10-CM

## 2021-02-24 DIAGNOSIS — H6123 Impacted cerumen, bilateral: Secondary | ICD-10-CM

## 2021-02-24 DIAGNOSIS — I10 Essential (primary) hypertension: Secondary | ICD-10-CM

## 2021-02-25 NOTE — Chronic Care Management (AMB) (Signed)
Chronic Care Management    Clinical Social Work Note  02/25/2021 Name: Corey Todd MRN: [id number] DOB: Jul 20, 1927  Corey Todd is a 87 y.o. year old male who is a primary care patient of [name], [name], MD. The CCM team was consulted to assist the patient with chronic disease management and/or care coordination needs related to: [employer], Level of Care Concerns, Hospice/Palliative Care Services Education, and Caregiver Stress.   Engaged with patient's wife by telephone for follow-up visit in response to provider referral for social work chronic care management and care coordination services.   Consent to Services:  The patient was given information about Chronic Care Management services, agreed to services, and gave verbal consent prior to initiation of services.  Please see initial visit note for detailed documentation.   Patient agreed to services and consent obtained.   Assessment: Review of patient past medical history, allergies, medications, and health status, including review of relevant consultants reports was performed today as part of a comprehensive evaluation and provision of chronic care management and care coordination services.     SDOH (Social Determinants of Health) assessments and interventions performed:    Advanced Directives Status: Not addressed in this encounter.  CCM Care Plan  No Known Allergies  Outpatient Encounter Medications as of 02/24/2021  Medication Sig   furosemide (LASIX) 20 MG tablet TAKE ONE  TABLET EVERY DAY AS NEEDED FOR FLUID OR EDEMA   levothyroxine (SYNTHROID) 50 MCG tablet Take 1 tablet (50 mcg total) by mouth daily.   Magnesium Oxide 250 MG TABS Take 1 tablet (250 mg total) by mouth every evening. (Patient not taking: Reported on 02/03/2021)   Multiple Vitamins-Minerals (CEROVITE PO) Take 1 tablet by mouth daily. (Patient not taking: No sig reported)   QUEtiapine (SEROQUEL) 25 MG tablet Take 3 tablets (75 mg total) by  mouth at bedtime.   UNABLE TO FIND Diet Type: Regular (Patient not taking: No sig reported)   No facility-administered encounter medications on file as of 02/24/2021.    Patient Active Problem List   Diagnosis Date Noted   Sundowning 05/02/2020   Cerumen impaction 12/23/2019   Acquired hypothyroidism 12/23/2019   Constipation 09/29/2018   Vascular dementia with behavioral disturbance 04/20/2018   Incontinence of urine 04/20/2018   Iron deficiency anemia 12/15/2017   Hospital discharge follow-up 12/15/2017   Bilateral lower extremity edema 11/25/2017   Closed right hip fracture, sequela 11/14/2017   Right leg swelling 10/17/2017   Proximal leg weakness 10/17/2017   Herpes zoster without complication 92/02/9416   Encounter for preventive health examination 02/14/2017   Skin lesion of face 09/09/2015   Counseling regarding advanced directives and goals of care 12/26/2014   Do not resuscitate discussion 12/26/2014   Hypertension 08/07/2014   Encounter for Medicare annual wellness exam 11/18/2012   GERD (gastroesophageal reflux disease) 05/09/2012   Presbyacusis    BPH (benign prostatic hyperplasia)     Conditions to be addressed/monitored: HTN and Dementia.  Limited Social Support, Lack of Multimedia programmer, Caregiver Stress, and Lacks Knowledge of [employer].  Care Plan : LCSW Plan of Care  Updates made by Francis Gaines, LCSW since 02/25/2021 12:00 AM     Problem: Improve My Quality of Life through [place] [facility].   Priority: High     Long-Range Goal: Improve My Quality of Life through [place] [facility].   Start Date: 05/10/2020  Expected End Date: 04/19/2021  This Visit's Progress: On track  Recent Progress:  On track  Priority: High  Note:   Current Barriers:  Chronic disease management support, education, resources, referrals and care management needs related to Mobility Issues, Fall Risk, Hypertension, Weakness, Fatigue  and Vascular Dementia with Behavioral Disturbance.  Clinical Goal(s):  LCSW will assist patient and wife with obtaining long-term care placement into a skilled nursing facility of choice. LCSW Interventions:  Collaboration with Primary Care Physician, Dr. Deborra Medina regarding development and update of comprehensive plan of care as evidenced by provider attestation and co-signature.   Collaboration with Primary Care Physician, Dr. Deborra Medina to request completion of an FL-2 Form for long-term care placement purposes. Collaboration with Primary Care Physician, Dr. Deborra Medina to report patient's recent rapid decline (I.e diminished appetite, unintentional weight loss, refusing to work with caregiver, not wanting to bath, dress, or leave the home, increased agitation and confusion, non-verbal, only agreeable to eating cereal, sleeping more hours during the day, etc.) and to request a neurological examination to determine disease progression. Collaboration with Primary Care Physician, Dr. Deborra Medina to consider appropriateness for Hospice and Palliative Care Services. Patient Goals/Self-Care Activities: Mrs. Espinola will continue to receive weekly/bi-weekly calls from LCSW, in an effort to coordinate long-term care placement for patient in a skilled nursing facility.   Mrs. Balke and LCSW will continue to collaborate with [name] [phone]), Admissions Director at the [facility] [place] in [place], to inquire about bed availability.     Mrs. Scheer will consider applying for [place] Medicaid through the [place]. Mrs. Kulzer will consider alternate placement arrangements for patient, reviewing complete list of long-term memory care assisted living facilities in [place], [name].    Mrs. Voller agreed to contact LCSW directly (# [phone]), if she has questions, needs assistance, or if  additional social work needs are identified between now and our next scheduled telephone outreach call. Follow-Up Plan:  LCSW will make contact with patient's wife on 03/10/2021 at 10:15am     [place] Licensed Clinical Social Worker [place]  [phone]

## 2021-02-25 NOTE — Patient Instructions (Signed)
Visit Information  Current Barriers:  Chronic disease management support, education, resources, referrals and care management needs related to Mobility Issues, Fall Risk, Hypertension, Weakness, Fatigue and Vascular Dementia with Behavioral Disturbance.  Clinical Goal(s):  LCSW will assist patient and wife with obtaining long-term care placement into a skilled nursing facility of choice. LCSW Interventions:  Collaboration with Primary Care Physician, Dr. Deborra Medina regarding development and update of comprehensive plan of care as evidenced by provider attestation and co-signature.   Collaboration with Primary Care Physician, Dr. Deborra Medina to request completion of an FL-2 Form for long-term care placement purposes. Collaboration with Primary Care Physician, Dr. Deborra Medina to report patient's recent rapid decline (I.e diminished appetite, unintentional weight loss, refusing to work with caregiver, not wanting to bath, dress, or leave the home, increased agitation and confusion, non-verbal, only agreeable to eating cereal, sleeping more hours during the day, etc.) and to request a neurological examination to determine disease progression. Collaboration with Primary Care Physician, Dr. Deborra Medina to consider appropriateness for Hospice and Palliative Care Services. Patient Goals/Self-Care Activities: Mrs. Federici will continue to receive weekly/bi-weekly calls from LCSW, in an effort to coordinate long-term care placement for patient in a skilled nursing facility.   Mrs. Terlizzi and LCSW will continue to collaborate with [name] [phone]), Admissions Director at the [facility] in [place], to inquire about bed availability.     Mrs. Khim will consider applying for [place] Medicaid through the [place]. Mrs. Picotte will consider alternate placement arrangements for patient, reviewing  complete list of long-term memory care assisted living facilities in [place], [name].    Mrs. Hopkin agreed to contact LCSW directly (# [phone]), if she has questions, needs assistance, or if additional social work needs are identified between now and our next scheduled telephone outreach call. Follow-Up Plan:  LCSW will make contact with patient's wife on 03/10/2021 at 10:15am    Patient verbalizes understanding of instructions provided today and agrees to view in [place].   Telephone follow up appointment with care management team member scheduled for:  03/10/2021 at 10:15am  [name] LCSW Licensed Clinical Social Worker [place]  [phone]

## 2021-03-07 ENCOUNTER — Ambulatory Visit: Payer: Medicare Other | Admitting: *Deleted

## 2021-03-07 DIAGNOSIS — F01518 Vascular dementia, unspecified severity, with other behavioral disturbance: Secondary | ICD-10-CM

## 2021-03-07 DIAGNOSIS — I10 Essential (primary) hypertension: Secondary | ICD-10-CM

## 2021-03-07 DIAGNOSIS — E669 Obesity, unspecified: Secondary | ICD-10-CM

## 2021-03-07 DIAGNOSIS — Z7189 Other specified counseling: Secondary | ICD-10-CM

## 2021-03-07 DIAGNOSIS — F05 Delirium due to known physiological condition: Secondary | ICD-10-CM

## 2021-03-07 DIAGNOSIS — R29898 Other symptoms and signs involving the musculoskeletal system: Secondary | ICD-10-CM

## 2021-03-07 NOTE — Chronic Care Management (AMB) (Signed)
Chronic Care Management    Clinical Social Work Note  03/07/2021 Name: Corey Todd MRN: [id number] DOB: 08-01-1927  Corey Todd is a 87 y.o. year old male who is a primary care patient of [name], [name], MD. The CCM team was consulted to assist the patient with chronic disease management and/or care coordination needs related to: [employer], Level of Care Concerns, Hospice/Palliative Care Services Education, and Caregiver Stress.   Engaged with patient's wife by telephone for follow up visit in response to provider referral for social work chronic care management and care coordination services.   Consent to Services:  The patient was given information about Chronic Care Management services, agreed to services, and gave verbal consent prior to initiation of services.  Please see initial visit note for detailed documentation.   Patient agreed to services and consent obtained.   Assessment: Review of patient past medical history, allergies, medications, and health status, including review of relevant consultants reports was performed today as part of a comprehensive evaluation and provision of chronic care management and care coordination services.     SDOH (Social Determinants of Health) assessments and interventions performed:    Advanced Directives Status: Not addressed in this encounter.  CCM Care Plan  No Known Allergies  Outpatient Encounter Medications as of 03/07/2021  Medication Sig   furosemide (LASIX) 20 MG tablet TAKE ONE  TABLET EVERY DAY AS NEEDED FOR FLUID OR EDEMA   levothyroxine (SYNTHROID) 50 MCG tablet Take 1 tablet (50 mcg total) by mouth daily.   Magnesium Oxide 250 MG TABS Take 1 tablet (250 mg total) by mouth every evening. (Patient not taking: Reported on 02/03/2021)   Multiple Vitamins-Minerals (CEROVITE PO) Take 1 tablet by mouth daily. (Patient not taking: No sig reported)   QUEtiapine (SEROQUEL) 25 MG tablet Take 3 tablets (75 mg total) by  mouth at bedtime.   UNABLE TO FIND Diet Type: Regular (Patient not taking: No sig reported)   No facility-administered encounter medications on file as of 03/07/2021.    Patient Active Problem List   Diagnosis Date Noted   Sundowning 05/02/2020   Cerumen impaction 12/23/2019   Acquired hypothyroidism 12/23/2019   Constipation 09/29/2018   Vascular dementia with behavioral disturbance 04/20/2018   Incontinence of urine 04/20/2018   Iron deficiency anemia 12/15/2017   Hospital discharge follow-up 12/15/2017   Bilateral lower extremity edema 11/25/2017   Closed right hip fracture, sequela 11/14/2017   Right leg swelling 10/17/2017   Proximal leg weakness 10/17/2017   Herpes zoster without complication 72/12/4707   Encounter for preventive health examination 02/14/2017   Skin lesion of face 09/09/2015   Counseling regarding advanced directives and goals of care 12/26/2014   Do not resuscitate discussion 12/26/2014   Hypertension 08/07/2014   Encounter for Medicare annual wellness exam 11/18/2012   GERD (gastroesophageal reflux disease) 05/09/2012   Presbyacusis    BPH (benign prostatic hyperplasia)     Conditions to be addressed/monitored: HTN and Dementia.  Level of Care Concerns, ADL/IADL Limitations, Limited Access to Caregiver, Cognitive Deficits, Memory Deficits, and Lacks Knowledge of [employer].  Care Plan : LCSW Plan of Care  Updates made by Francis Gaines, LCSW since 03/07/2021 12:00 AM     Problem: Improve My Quality of Life through [place] [facility]acement.   Priority: High     Long-Range Goal: Improve My Quality of Life through [place] [facility]acement.   Start Date: 05/10/2020  Expected End Date: 04/19/2021  This Visit's  Progress: On track  Recent Progress: On track  Priority: High  Note:   Current Barriers:  Chronic disease management support, education, resources, referrals and care management needs related to Mobility Issues,  Fall Risk, Hypertension, Weakness, Fatigue and Vascular Dementia with Behavioral Disturbance.  Clinical Goal(s):  LCSW will assist patient and wife with obtaining long-term care placement into a skilled nursing facility of choice. LCSW Interventions:  Collaboration with Primary Care Physician, Dr. Deborra Medina regarding development and update of comprehensive plan of care as evidenced by provider attestation and co-signature.   Patient Goals/Self-Care Activities: Mrs. Bley will continue to receive bi-weekly calls from LCSW, in an effort to coordinate long-term care placement for patient in a skilled nursing facility, as well as arrange Palliative Care Services, through [employer].   LCSW collaboration with Primary Care Physician, Dr. Deborra Medina to request written order and referral to [facility] for [place] in the home. Mrs. Sakata and LCSW will continue to collaborate with [name] [phone]), Admissions Director at the [facility] in [place], to inquire about bed availability.      Repeated unsuccessful outreach call attempts made, and HIPAA compliant messages left on voicemail. Mrs. Petrovic will continue to consider applying for [place] Medicaid, through the [place]. Mrs. Dismore will review list of long-term memory care assisted living facilities, as well as skilled nursing facilities, in [place], [name], and will be prepared to share facilities of interest with LCSW during our next scheduled telephone outreach call. Mrs. Carreras will review list of [place] and Facilities, e-mailed to her today, and consider short-term placement, while awaiting for long-term bed availability.   Mrs. Brearley agreed to contact LCSW directly (# [phone]), if she has questions, needs assistance, or if additional social work needs are identified in  the near future.   FL-2 Form faxed to [facility], [facility], and [place], awaiting bed offers. Follow-Up Plan:  LCSW will make contact with patient's wife on 03/23/2021 at 10:00 am     [place] Clinical Social Worker [place]  [phone]

## 2021-03-07 NOTE — Patient Instructions (Signed)
Visit Information  Thank you for taking time to visit with me today. Please don't hesitate to contact me if I can be of assistance to you before our next scheduled telephone appointment.  Following are the goals we discussed today:   Patient Goals/Self-Care Activities: Corey Todd will continue to receive bi-weekly calls from LCSW, in an effort to coordinate long-term care placement for patient in a skilled nursing facility, as well as arrange Palliative Care Services, through [employer].   LCSW collaboration with Primary Care Physician, Dr. Deborra Medina to request written order and referral to [facility] for [name] in the home. Corey Todd and LCSW will continue to collaborate with [name] [phone]), Admissions Director at the [facility] in [place], to inquire about bed availability.      Repeated unsuccessful outreach call attempts made, and HIPAA compliant messages left on voicemail. Corey Todd will continue to consider applying for [name] Medicaid, through the [name]. Corey Todd will review list of long-term memory care assisted living facilities, as well as skilled nursing facilities, in [place], [name], and will be prepared to share facilities of interest with LCSW during our next scheduled telephone outreach call. Corey Todd will review list of [name], e-mailed to her today, and consider short-term placement, while awaiting for long-term bed availability.   Corey Todd agreed to contact LCSW directly (# [phone]), if she has questions, needs assistance, or if additional social work needs are identified in the near future.   FL-2 Form faxed to [facility], [facility], and [place], awaiting bed offers. Follow-Up Plan:  LCSW will make contact  with patient's wife on 03/23/2021 at 10:00 am  Our next appointment is by telephone on 03/23/2021 at 10:00 am.  Please call the care guide team at [phone] if you need to cancel or reschedule your appointment.   Please call the Suicide and Crisis Lifeline: 988 call the [place] National Suicide Prevention Lifeline: 807-496-9546 or TTY: 940 458 8173 TTY 272-147-3336) to talk to a trained counselor call 1-800-273-TALK (toll free, 24 hour hotline) go to [facility] [address] [place] [phone]) call 911 if you are experiencing a Mental Health or [place] or need someone to talk to.  Patient verbalizes understanding of instructions provided today and agrees to view in [place].   [name] LCSW Licensed Clinical Social Worker [place]  [phone]

## 2021-03-10 ENCOUNTER — Telehealth: Payer: Medicare Other

## 2021-03-15 DIAGNOSIS — F01518 Vascular dementia, unspecified severity, with other behavioral disturbance: Secondary | ICD-10-CM

## 2021-03-15 DIAGNOSIS — F05 Delirium due to known physiological condition: Secondary | ICD-10-CM

## 2021-03-15 DIAGNOSIS — I10 Essential (primary) hypertension: Secondary | ICD-10-CM

## 2021-03-23 ENCOUNTER — Ambulatory Visit: Payer: Medicare Other | Admitting: *Deleted

## 2021-03-23 DIAGNOSIS — R600 Localized edema: Secondary | ICD-10-CM

## 2021-03-23 DIAGNOSIS — F05 Delirium due to known physiological condition: Secondary | ICD-10-CM

## 2021-03-23 DIAGNOSIS — H9111 Presbycusis, right ear: Secondary | ICD-10-CM

## 2021-03-23 DIAGNOSIS — F01518 Vascular dementia, unspecified severity, with other behavioral disturbance: Secondary | ICD-10-CM

## 2021-03-23 DIAGNOSIS — Z7189 Other specified counseling: Secondary | ICD-10-CM

## 2021-03-23 DIAGNOSIS — E669 Obesity, unspecified: Secondary | ICD-10-CM

## 2021-03-23 DIAGNOSIS — R29898 Other symptoms and signs involving the musculoskeletal system: Secondary | ICD-10-CM

## 2021-03-23 DIAGNOSIS — N3942 Incontinence without sensory awareness: Secondary | ICD-10-CM

## 2021-03-23 DIAGNOSIS — I10 Essential (primary) hypertension: Secondary | ICD-10-CM

## 2021-03-23 DIAGNOSIS — H6123 Impacted cerumen, bilateral: Secondary | ICD-10-CM

## 2021-03-23 NOTE — Chronic Care Management (AMB) (Addendum)
Chronic Care Management    Clinical Social Work Note  03/23/2021 Name: Corey Todd MRN: [id number] DOB: 09-18-1927  Corey Todd is a 87 y.o. year old male who is a primary care patient of [name], [name], MD. The CCM team was consulted to assist the patient with chronic disease management and/or care coordination needs related to: [employer], Level of Care Concerns, Hospice/Palliative Care Services Education, and Caregiver Stress.   Engaged with patient's wife by telephone for follow up visit in response to provider referral for social work chronic care management and care coordination services.   Consent to Services:  The patient was given information about Chronic Care Management services, agreed to services, and gave verbal consent prior to initiation of services.  Please see initial visit note for detailed documentation.   Patient agreed to services and consent obtained.   Assessment: Review of patient past medical history, allergies, medications, and health status, including review of relevant consultants reports was performed today as part of a comprehensive evaluation and provision of chronic care management and care coordination services.     SDOH (Social Determinants of Health) assessments and interventions performed:    Advanced Directives Status: Not addressed in this encounter.  CCM Care Plan  No Known Allergies  Outpatient Encounter Medications as of 03/23/2021  Medication Sig   furosemide (LASIX) 20 MG tablet TAKE ONE  TABLET EVERY DAY AS NEEDED FOR FLUID OR EDEMA   levothyroxine (SYNTHROID) 50 MCG tablet Take 1 tablet (50 mcg total) by mouth daily.   Magnesium Oxide 250 MG TABS Take 1 tablet (250 mg total) by mouth every evening. (Patient not taking: Reported on 02/03/2021)   Multiple Vitamins-Minerals (CEROVITE PO) Take 1 tablet by mouth daily. (Patient not taking: No sig reported)   QUEtiapine (SEROQUEL) 25 MG tablet Take 3 tablets (75 mg total) by  mouth at bedtime.   UNABLE TO FIND Diet Type: Regular (Patient not taking: No sig reported)   No facility-administered encounter medications on file as of 03/23/2021.    Patient Active Problem List   Diagnosis Date Noted   Sundowning 05/02/2020   Cerumen impaction 12/23/2019   Acquired hypothyroidism 12/23/2019   Constipation 09/29/2018   Vascular dementia with behavioral disturbance 04/20/2018   Incontinence of urine 04/20/2018   Iron deficiency anemia 12/15/2017   Hospital discharge follow-up 12/15/2017   Bilateral lower extremity edema 11/25/2017   Closed right hip fracture, sequela 11/14/2017   Right leg swelling 10/17/2017   Proximal leg weakness 10/17/2017   Herpes zoster without complication 92/42/6834   Encounter for preventive health examination 02/14/2017   Skin lesion of face 09/09/2015   Counseling regarding advanced directives and goals of care 12/26/2014   Do not resuscitate discussion 12/26/2014   Hypertension 08/07/2014   Encounter for Medicare annual wellness exam 11/18/2012   GERD (gastroesophageal reflux disease) 05/09/2012   Presbyacusis    BPH (benign prostatic hyperplasia)     Conditions to be addressed/monitored: Dementia.  Limited Social Support, Level of Care Concerns, ADL/IADL Limitations, Social Isolation, Limited Access to Caregiver, Cognitive Deficits, Memory Deficits, and Lacks Knowledge of [employer].  Care Plan : LCSW Plan of Care  Updates made by Francis Gaines, LCSW since 03/23/2021 12:00 AM     Problem: Improve My Quality of Life through [place] Placement.   Priority: High     Long-Range Goal: Improve My Quality of Life through [place] [facility]acement.   Start Date: 05/10/2020  Expected End Date: 04/19/2021  This Visit's Progress: On track  Recent Progress: On track  Priority: High  Note:   Current Barriers:  Chronic disease management support, education, resources, referrals and care management  needs related to Mobility Issues, Fall Risk, Hypertension, Weakness, Fatigue and Vascular Dementia with Behavioral Disturbance.  Clinical Goal(s):  LCSW will assist patient and wife with obtaining long-term care placement into a skilled nursing facility of choice. LCSW Interventions:  Collaboration with Primary Care Physician, Dr. Deborra Medina regarding development and update of comprehensive plan of care as evidenced by provider attestation and co-signature.   Patient Goals/Self-Care Activities: Mrs. Vanderheiden will continue to receive bi-weekly calls from LCSW, in an effort to coordinate long-term care placement for patient in a skilled nursing facility, as well as arrange Palliative Care Services.    LCSW collaboration with Primary Care Physician, Dr. Deborra Medina to inquire about referral for palliative care services. Mrs. Plotts and LCSW will continue to collaborate with [name] [phone]), Admissions Director at the [facility] in [place], to inquire about bed availability.     Repeated unsuccessful outreach call attempts made, and HIPAA compliant messages left on voicemail. Mrs. Newman will continue to consider applying for [place] Medicaid, through the [place]. Mrs. Effertz will review list of long-term memory care assisted living facilities, as well as skilled nursing facilities, in [place], [name], and will be prepared to share facilities of interest with LCSW during our next scheduled telephone outreach call. After extensive research and facility visits, Mrs. Achord reported that she is only interested in placement for patient at the [facility], [facility], [place] and [place]. Mrs. Cooks will review list of [place] and Facilities, and consider short-term placement, while awaiting for  long-term bed availability.  Mrs. Skow reported no interest in [place] and/or Facilities, at this time.  FL-2 Form faxed to [facility], [place], [facility], awaiting bed offers. No bed offers received.  FL-2 Form re-faxed on 03/23/2021, to all of the above facilities.  Awaiting bed offers. Mrs. Wulf agreed to contact LCSW directly (# [phone]), if she has questions, needs assistance, or if additional social work needs are identified in the near future.   Follow-Up Plan:  LCSW will make contact with patient's wife on 04/06/2021 at 11:15 am.     [name] LCSW Licensed Clinical Social Worker [place]  [phone]

## 2021-03-23 NOTE — Patient Instructions (Signed)
Visit Information  Thank you for taking time to visit with me today. Please don't hesitate to contact me if I can be of assistance to you before our next scheduled telephone appointment.  Following are the goals we discussed today:   Patient Goals/Self-Care Activities: Mrs. Mielke will continue to receive bi-weekly calls from LCSW, in an effort to coordinate long-term care placement for patient in a skilled nursing facility, as well as arrange Palliative Care Services.    LCSW collaboration with Primary Care Physician, Dr. Deborra Medina to inquire about referral for palliative care services. Mrs. Maclay and LCSW will continue to collaborate with [name] [phone]), Admissions Director at the [facility] in [place], to inquire about bed availability.     Repeated unsuccessful outreach call attempts made, and HIPAA compliant messages left on voicemail. Mrs. Rieman will continue to consider applying for [place] Medicaid, through the [place]. Mrs. Briski will review list of long-term memory care assisted living facilities, as well as skilled nursing facilities, in [place], [name], and will be prepared to share facilities of interest with LCSW during our next scheduled telephone outreach call. After extensive research and facility visits, Mrs. Muchow reported that she is only interested in placement for patient at the [facility], [facility], [place] and [place]. Mrs. Passe will review list of [place] and Facilities, and consider short-term placement, while awaiting for long-term bed availability.  Mrs. Mcglasson reported no interest in [place] and/or Facilities, at this time.  FL-2 Form faxed to [facility] [place], [facility], [facility], awaiting bed offers. No bed offers received.  FL-2 Form re-faxed on 03/23/2021, to all of the above facilities.  Awaiting bed offers. Mrs. Belvedere agreed to contact LCSW directly (# [phone]), if she has questions, needs assistance, or if additional social work needs are identified in the near future.    Follow-Up Plan:  LCSW will make contact with patient's wife on 04/06/2021 at 11:15 am.  Please call the care guide team at [phone] if you need to cancel or reschedule your appointment.   If you are experiencing a Mental Health or [place] or need someone to talk to, please call the Suicide and Crisis Lifeline: 988 call the [place] National Suicide Prevention Lifeline: 772-862-5952 or TTY: (240)474-7929 TTY 914 252 3478) to talk to a trained counselor call 1-800-273-TALK (toll free, 24 hour hotline) go to [facility] [address], [place] [phone]) call the [place]: [phone] call 911   Patient verbalizes understanding of instructions provided today and agrees to view in [place].   [name] LCSW Licensed Clinical Social Worker [place]  [phone]

## 2021-04-06 ENCOUNTER — Ambulatory Visit (INDEPENDENT_AMBULATORY_CARE_PROVIDER_SITE_OTHER): Payer: Medicare Other | Admitting: *Deleted

## 2021-04-06 DIAGNOSIS — H6123 Impacted cerumen, bilateral: Secondary | ICD-10-CM

## 2021-04-06 DIAGNOSIS — F01518 Vascular dementia, unspecified severity, with other behavioral disturbance: Secondary | ICD-10-CM

## 2021-04-06 DIAGNOSIS — N3942 Incontinence without sensory awareness: Secondary | ICD-10-CM

## 2021-04-06 DIAGNOSIS — F05 Delirium due to known physiological condition: Secondary | ICD-10-CM

## 2021-04-06 DIAGNOSIS — R600 Localized edema: Secondary | ICD-10-CM

## 2021-04-06 DIAGNOSIS — I10 Essential (primary) hypertension: Secondary | ICD-10-CM

## 2021-04-06 DIAGNOSIS — E669 Obesity, unspecified: Secondary | ICD-10-CM

## 2021-04-06 DIAGNOSIS — R29898 Other symptoms and signs involving the musculoskeletal system: Secondary | ICD-10-CM

## 2021-04-09 NOTE — Patient Instructions (Signed)
Visit Information  Thank you for taking time to visit with me today. Please don't hesitate to contact me if I can be of assistance to you before our next scheduled telephone appointment.  Following are the goals we discussed today:   Patient Goals/Self-Care Activities: Mrs. Stepanek will continue to receive bi-weekly calls from LCSW, in an effort to coordinate long-term care placement for you in a skilled nursing facility, as well as arrange Palliative Care Services.   Mrs. Veldhuizen and LCSW will continue to collaborate with [name] [phone]), Admissions Director at the [facility] in [place], to inquire about bed availability and grand opening date.     LCSW collaboration with admissions coordinators at [facility], [place] and [place], to confirm receipt of completed and signed FL-2 Form for review and consideration for placement purposes. LCSW collaboration with Dr. Christell Faith, [place] Provider with [place] (# [phone]), to request assistance with placement for patient at the [facility] [place]. LCSW collaboration with Dr. Christell Faith, [place] Physician with [facility], to request follow-up appointment for patient sooner than March of 2023. Mrs. Liszewski agreed to contact LCSW directly (# [phone]), if she has questions, needs assistance, or if additional social work needs are identified between now and our next scheduled telephone outreach call.    Follow-Up Plan:  LCSW will make contact with patient's wife on 05/09/2021 at 9:00 am.  Please call the care guide team at [phone] if you need to cancel or reschedule your appointment.   If you are experiencing a Mental Health or [place] or need someone to talk to, please call the Suicide and Crisis Lifeline: 988 call the [place] National Suicide  Prevention Lifeline: 657-450-9870 or TTY: 667-388-7235 TTY (518)225-2388) to talk to a trained counselor call 1-800-273-TALK (toll free, 24 hour hotline) go to [facility] [address], [place] [phone]) call the [place]: [phone] call 911   Patient verbalizes understanding of instructions provided today and agrees to view in [place].   [name] LCSW Licensed Clinical Social Worker [place]  [phone]

## 2021-04-09 NOTE — Chronic Care Management (AMB) (Signed)
Chronic Care Management    Clinical Social Work Note  04/09/2021 Name: Corey Todd MRN: [id number] DOB: [birth year]  Corey Todd is a 87 y.o. year old male who is a primary care patient of [name], [name], MD. The CCM team was consulted to assist the patient with chronic disease management and/or care coordination needs related to: [employer], Level of Care Concerns, Hospice/Palliative Care Services Education, and Caregiver Stress.   Engaged with patient's wife by telephone for follow up visit in response to provider referral for social work chronic care management and care coordination services.   Consent to Services:  The patient was given information about Chronic Care Management services, agreed to services, and gave verbal consent prior to initiation of services.  Please see initial visit note for detailed documentation.   Patient agreed to services and consent obtained.   Assessment: Review of patient past medical history, allergies, medications, and health status, including review of relevant consultants reports was performed today as part of a comprehensive evaluation and provision of chronic care management and care coordination services.     SDOH (Social Determinants of Health) assessments and interventions performed:    Advanced Directives Status: Not addressed in this encounter.  CCM Care Plan  No Known Allergies  Outpatient Encounter Medications as of 04/06/2021  Medication Sig   furosemide (LASIX) 20 MG tablet TAKE ONE  TABLET EVERY DAY AS NEEDED FOR FLUID OR EDEMA   levothyroxine (SYNTHROID) 50 MCG tablet Take 1 tablet (50 mcg total) by mouth daily.   Magnesium Oxide 250 MG TABS Take 1 tablet (250 mg total) by mouth every evening. (Patient not taking: Reported on 02/03/2021)   Multiple Vitamins-Minerals (CEROVITE PO) Take 1 tablet by mouth daily. (Patient not taking: No sig reported)   QUEtiapine (SEROQUEL) 25 MG tablet Take 3 tablets (75 mg total) by  mouth at bedtime.   UNABLE TO FIND Diet Type: Regular (Patient not taking: No sig reported)   No facility-administered encounter medications on file as of 04/06/2021.    Patient Active Problem List   Diagnosis Date Noted   Sundowning 05/02/2020   Cerumen impaction 12/23/2019   Acquired hypothyroidism 12/23/2019   Constipation 09/29/2018   Vascular dementia with behavioral disturbance 04/20/2018   Incontinence of urine 04/20/2018   Iron deficiency anemia 12/15/2017   Hospital discharge follow-up 12/15/2017   Bilateral lower extremity edema 11/25/2017   Closed right hip fracture, sequela 11/14/2017   Right leg swelling 10/17/2017   Proximal leg weakness 10/17/2017   Herpes zoster without complication 48/18/5631   Encounter for preventive health examination 02/14/2017   Skin lesion of face 09/09/2015   Counseling regarding advanced directives and goals of care 12/26/2014   Do not resuscitate discussion 12/26/2014   Hypertension 08/07/2014   Encounter for Medicare annual wellness exam 11/18/2012   GERD (gastroesophageal reflux disease) 05/09/2012   Presbyacusis    BPH (benign prostatic hyperplasia)     Conditions to be addressed/monitored: HTN and Dementia.  Limited Social Support, Level of Care Concerns, ADL/IADL Limitations, Social Isolation, Limited Access to Caregiver, Cognitive Deficits, Memory Deficits, and Lacks Knowledge of [employer].  Care Plan : LCSW Plan of Care  Updates made by Francis Gaines, LCSW since 04/09/2021 12:00 AM     Problem: Improve My Quality of Life through [place] Placement.   Priority: High     Long-Range Goal: Improve My Quality of Life through [place] Placement.   Start Date: 05/10/2020  Expected End  Date: 05/09/2021  This Visit's Progress: On track  Recent Progress: On track  Priority: High  Note:   Current Barriers:  Chronic disease management support, education, resources, referrals and care  management needs related to Mobility Issues, Fall Risk, Hypertension, Weakness, Fatigue and Vascular Dementia with Behavioral Disturbance.  Clinical Goal(s):  LCSW will assist patient and wife with obtaining long-term care placement into a skilled nursing facility of choice. LCSW Interventions:  Collaboration with Primary Care Physician, Dr. Deborra Medina regarding development and update of comprehensive plan of care as evidenced by provider attestation and co-signature.   Interdisciplinary care team collaboration. Patient Goals/Self-Care Activities: Mrs. Guthrie will continue to receive bi-weekly calls from LCSW, in an effort to coordinate long-term care placement for you in a skilled nursing facility, as well as arrange Palliative Care Services.   Mrs. Weese and LCSW will continue to collaborate with [name] [phone]), Admissions Director at the [facility] in [place], to inquire about bed availability and grand opening date.     LCSW collaboration with admissions coordinators at [facility], [place] and [place], to confirm receipt of completed and signed FL-2 Form for review and consideration for placement purposes. LCSW collaboration with Dr. Christell Faith, [place] Provider with [place] (# [phone]), to request assistance with placement for patient at the [facility] in [place]. LCSW collaboration with Dr. Christell Faith, [place] Physician with [facility], to request follow-up appointment for patient sooner than March of 2023. Mrs. Staubs agreed to contact LCSW directly (# [phone]), if she has questions, needs assistance, or if additional social work needs are identified between now and our next scheduled telephone outreach call.   Follow-Up Plan:  LCSW will make contact with patient's wife on 05/09/2021 at 9:00 am.      [name] LCSW Licensed Clinical Social Worker [place]  [phone]

## 2021-04-15 DIAGNOSIS — I10 Essential (primary) hypertension: Secondary | ICD-10-CM

## 2021-04-15 DIAGNOSIS — F05 Delirium due to known physiological condition: Secondary | ICD-10-CM

## 2021-04-15 DIAGNOSIS — F01518 Vascular dementia, unspecified severity, with other behavioral disturbance: Secondary | ICD-10-CM

## 2021-04-18 NOTE — Telephone Encounter (Signed)
Spoke with pt's wife to schedule an appt to discuss the pt's medications and dementia. Wife stated that they can only do a telephone visit because she is sick and it is very hard to get her husband ready and out of the office. Pt has been scheduled for a telephone visit on Thursday.

## 2021-04-18 NOTE — Telephone Encounter (Signed)
Pts wife called in regards to pts dementia. Wife states when he began the medication it seemed to help him out a lot but now he is back to doing some of the same things he did prior to taking medication. Such as waking up in the middle of the night and "rambling" around the house.

## 2021-04-20 ENCOUNTER — Encounter: Payer: Self-pay | Admitting: Internal Medicine

## 2021-04-20 ENCOUNTER — Ambulatory Visit (INDEPENDENT_AMBULATORY_CARE_PROVIDER_SITE_OTHER): Payer: Medicare Other | Admitting: Internal Medicine

## 2021-04-20 DIAGNOSIS — R159 Full incontinence of feces: Secondary | ICD-10-CM

## 2021-04-20 DIAGNOSIS — F01518 Vascular dementia, unspecified severity, with other behavioral disturbance: Secondary | ICD-10-CM

## 2021-04-20 DIAGNOSIS — R635 Abnormal weight gain: Secondary | ICD-10-CM | POA: Diagnosis not present

## 2021-04-20 MED ORDER — QUETIAPINE FUMARATE 25 MG PO TABS: 50.0000 mg | ORAL_TABLET | Freq: Every day | ORAL | 5 refills | Status: DC

## 2021-04-20 MED ORDER — LORAZEPAM 0.5 MG PO TABS: 0.5000 mg | ORAL_TABLET | Freq: Every day | ORAL | 0 refills | Status: DC

## 2021-04-20 NOTE — Assessment & Plan Note (Signed)
Due to improved appetite,  Decreased physical activity .  Reducing dose of seroquel to 50 mg qsupper

## 2021-04-20 NOTE — Progress Notes (Signed)
Telephone  Note  This visit type was conducted due to national recommendations for restrictions regarding the COVID-19 pandemic (e.g. social distancing).  This format is felt to be most appropriate for this patient at this time.  All issues noted in this document were discussed and addressed.  No physical exam was performed (except for noted visual exam findings with Video Visits).   I connected withNAME@ on 04/20/21 at 10:30 AM EST by  telephone and verified that I am speaking with the correct person using two identifiers. Location patient: home Location provider: work or home office Persons participating in the virtual visit: patient, provider AND [name] LOUISE  I discussed the limitations, risks, security and privacy concerns of performing an evaluation and management service by telephone and the availability of in person appointments. I also discussed with the patient that there may be a patient responsible charge related to this service. The patient expressed understanding and agreed to proceed.  Reason for visit: increased nocturnal activity,  nighttime binge eating   HPI:  88 yr old male with advanced vascular dementia , wife is primary caregiver,  presents for management of nocturnal wandering.  He is Currently taking 75 mg seroquel  at bedtime (10 pm)   which was working well to prevent any nocturnal agitation until about  4  weeks ago ,. Appetite has changed from pickythis summer (would only eat cereal)  to healthy and constant.  Recently ate and entire box of raisin bran during the night.  While wife slept. Had diarrhea voluminous,  the entire next day.  .    Wife states that the patient is sound asleep at midnight when wife  goes to bed, but sometime before morning he has gone to the kitchen and eaten whatever he can find.  He sleeps a lot during the day,  she gets him up by 9 am .  He  naps throughout  the day,  will not exercise,  and is very unstable on his feet . Has fecal  and urinary incontinence ,  and wears an adult diaper 24/7 .  Conversational skills have declined but he remains upbeat.     ROS: See pertinent positives and negatives per HPI.  Past Medical History:  Diagnosis Date   Anxiety    Arthritis    BPH (benign prostatic hypertrophy)    DVT (deep vein thrombosis) in pregnancy    Presbyacusis    right sided hearing aid    Past Surgical History:  Procedure Laterality Date   EYE SURGERY     cataract surgery   HERNIA REPAIR     INTRAMEDULLARY (IM) NAIL INTERTROCHANTERIC Right 11/14/2017   Procedure: INTRAMEDULLARY (IM) NAIL INTERTROCHANTRIC;  Surgeon: Leim Fabry, MD;  Location: [facility] ORS;  Service: Orthopedics;  Laterality: Right;   JOINT REPLACEMENT  1953   ankle crush injury, right   PROSTATE SURGERY     biopsy    TONSILLECTOMY AND ADENOIDECTOMY  1936   TOTAL HIP ARTHROPLASTY  2007   left , Dr Cay Schillings    Family History  Problem Relation Age of Onset   Arthritis Mother    Kidney cancer Neg Hx    Kidney disease Neg Hx    Prostate cancer Neg Hx     SOCIAL HX:  reports that he quit smoking about 67 years ago. His smoking use included cigarettes. He has been exposed to tobacco smoke. He has never used smokeless tobacco. He reports that he does not drink alcohol and does not  use drugs.    Current Outpatient Medications:    levothyroxine (SYNTHROID) 50 MCG tablet, Take 1 tablet (50 mcg total) by mouth daily., Disp: 90 tablet, Rfl: 3   LORazepam (ATIVAN) 0.5 MG tablet, Take 1 tablet (0.5 mg total) by mouth at bedtime., Disp: 30 tablet, Rfl: 0   furosemide (LASIX) 20 MG tablet, TAKE ONE  TABLET EVERY DAY AS NEEDED FOR FLUID OR EDEMA (Patient not taking: Reported on 04/20/2021), Disp: 90 tablet, Rfl: 1   QUEtiapine (SEROQUEL) 25 MG tablet, Take 2 tablets (50 mg total) by mouth daily after supper., Disp: 60 tablet, Rfl: 5  EXAM:  Per patient's wife, patient is generally: : alert, cooperative and inarticulate.  He has exhibited no signs  of being in distress  Lungs: speech is  not fluent due to dementia,  but wife  has not noticed any shortness  of breath and his speech is not punctuated by cough, sneezing or sniffing. Marland Kitchen   Psych: affect jovial  and upbeat   ASSESSMENT AND PLAN:  Discussed the following assessment and plan:  Vascular dementia with behavioral disturbance  Full incontinence of feces  Weight gain  Vascular dementia with behavioral disturbance (HCC) Reducing dose of seroquel to 50 mg at dinner time.  And adding lorazepam 0.5 mg at bedtime as a trial  Incontinence of feces Secondary to vascular dementia.continue use of diapers  Weight gain Due to improved appetite,  Decreased physical activity .  Reducing dose of seroquel to 50 mg qsupper    I discussed the assessment and treatment plan with the patient. The patient was provided an opportunity to ask questions and all were answered. The patient agreed with the plan and demonstrated an understanding of the instructions.   The patient was advised to call back or seek an in-person evaluation if the symptoms worsen or if the condition fails to improve as anticipated.   I spent 25 minutes dedicated to the care of this patient on the date of this encounter to include pre-visit review of his medical history,  non Face-to-face time with the patient 's wife , and post visit ordering of testing and therapeutics.    Crecencio Mc, MD

## 2021-04-20 NOTE — Assessment & Plan Note (Signed)
Secondary to vascular dementia.continue use of diapers

## 2021-04-20 NOTE — Assessment & Plan Note (Signed)
Reducing dose of seroquel to 50 mg at dinner time.  And adding lorazepam 0.5 mg at bedtime as a trial

## 2021-04-24 ENCOUNTER — Ambulatory Visit (INDEPENDENT_AMBULATORY_CARE_PROVIDER_SITE_OTHER): Payer: Medicare Other | Admitting: *Deleted

## 2021-04-24 DIAGNOSIS — I10 Essential (primary) hypertension: Secondary | ICD-10-CM

## 2021-04-24 DIAGNOSIS — F01518 Vascular dementia, unspecified severity, with other behavioral disturbance: Secondary | ICD-10-CM

## 2021-04-24 DIAGNOSIS — E669 Obesity, unspecified: Secondary | ICD-10-CM

## 2021-04-24 DIAGNOSIS — H9111 Presbycusis, right ear: Secondary | ICD-10-CM

## 2021-04-24 DIAGNOSIS — H6123 Impacted cerumen, bilateral: Secondary | ICD-10-CM

## 2021-04-24 DIAGNOSIS — R29898 Other symptoms and signs involving the musculoskeletal system: Secondary | ICD-10-CM

## 2021-04-24 DIAGNOSIS — R600 Localized edema: Secondary | ICD-10-CM

## 2021-04-24 DIAGNOSIS — R635 Abnormal weight gain: Secondary | ICD-10-CM

## 2021-04-24 DIAGNOSIS — F05 Delirium due to known physiological condition: Secondary | ICD-10-CM

## 2021-04-24 NOTE — Patient Instructions (Signed)
Visit Information ° °Thank you for taking time to visit with me today. Please don't hesitate to contact me if I can be of assistance to you before our next scheduled telephone appointment. ° °Following are the goals we discussed today:  °Patient Goals/Self-Care Activities: °Mrs. Coudriet will continue to receive bi-weekly calls from LCSW, in an effort to coordinate long-term care placement for you in a skilled nursing facility, as well as arrange Palliative Care Services.   °Mrs. Vogt and LCSW will continue to collaborate with Kit Ferguson (# [phone]), Admissions Director at the [facility] in [place], to inquire about bed availability and grand opening date.   °~Grand opening has been further delayed.   °LCSW collaboration with Primary Care Physician, Dr. Teresa Tullo to request updated FL-2 Form, for long-term care placement purposes. °Once updated FL-2 Form is received, LCSW will fax to all of the following long-term care facilities of interest, to try and pursue bed offers: °~[facility] °~[facility] °~[facility] °~[facility]  °LCSW collaboration with Dr. Juha, Primary Care Provider with [facility] (# [phone]), to request assistance with placement for patient at the [facility] in . °~Second unsuccessful outreach call attempt.  HIPAA compliant message left on voicemail.  Will make third outreach call attempt in one week, if a return call is not received from Dr. Juha in the meantime. °LCSW collaboration with Dr. Juha, Primary Care Physician with [facility], to request follow-up appointment for patient sooner than March of 2023. °~Second unsuccessful outreach call attempt.  HIPAA compliant message left on voicemail.  Will make third outreach call attempt in one week, if a return call is not received from Dr. Juha in the  meantime. °Mrs. Destin agreed to contact LCSW directly (# [phone]), if she has questions, needs assistance, or if additional social work needs are identified between now and our next scheduled telephone outreach call.   °Follow-Up Plan:  LCSW will make contact with patient's wife on 05/09/2021 at 9:00 am. ° °Please call the care guide team at [phone] if you need to cancel or reschedule your appointment.  ° °If you are experiencing a Mental Health or Behavioral Health Crisis or need someone to talk to, please call the Suicide and Crisis Lifeline: 988 °call the [place] National Suicide Prevention Lifeline: 1-800-273-8255 or TTY: 1-800-799-4 TTY (1-800-799-4889) to talk to a trained counselor °call 1-800-273-TALK (toll free, 24 hour hotline) °go to [facility] [address], [place] ([phone]) °call the [place] Crisis Line: 800-939-9988 °call 911  ° °Patient verbalizes understanding of instructions provided today and agrees to view in MyChart.  ° °  LCSW °Licensed Clinical Social Worker °[facility] [place]  °[phone]  ° °

## 2021-04-24 NOTE — Chronic Care Management (AMB) (Signed)
Chronic Care Management    Clinical Social Work Note  04/24/2021 Name: Corey Todd MRN: [id number] DOB: 1927-06-15  Corey Todd is a 88 y.o. year old male who is a primary care patient of [name], [name], MD. The CCM team was consulted to assist the patient with chronic disease management and/or care coordination needs related to: Level of Care Concerns, Hospice/Palliative Care Services Education, and Caregiver Stress.   Engaged with patient's wife by telephone for follow up visit in response to provider referral for social work chronic care management and care coordination services.   Consent to Services:  The patient was given information about Chronic Care Management services, agreed to services, and gave verbal consent prior to initiation of services.  Please see initial visit note for detailed documentation.   Patient agreed to services and consent obtained.   Assessment: Review of patient past medical history, allergies, medications, and health status, including review of relevant consultants reports was performed today as part of a comprehensive evaluation and provision of chronic care management and care coordination services.     SDOH (Social Determinants of Health) assessments and interventions performed:    Advanced Directives Status: Not addressed in this encounter.  CCM Care Plan  No Known Allergies  Outpatient Encounter Medications as of 04/24/2021  Medication Sig   furosemide (LASIX) 20 MG tablet TAKE ONE  TABLET EVERY DAY AS NEEDED FOR FLUID OR EDEMA (Patient not taking: Reported on 04/20/2021)   levothyroxine (SYNTHROID) 50 MCG tablet Take 1 tablet (50 mcg total) by mouth daily.   LORazepam (ATIVAN) 0.5 MG tablet Take 1 tablet (0.5 mg total) by mouth at bedtime.   QUEtiapine (SEROQUEL) 25 MG tablet Take 2 tablets (50 mg total) by mouth daily after supper.   No facility-administered encounter medications on file as of 04/24/2021.    Patient Active Problem List    Diagnosis Date Noted   Incontinence of feces 04/20/2021   Weight gain 04/20/2021   Sundowning 05/02/2020   Cerumen impaction 12/23/2019   Acquired hypothyroidism 12/23/2019   Constipation 09/29/2018   Vascular dementia with behavioral disturbance 04/20/2018   Incontinence of urine 04/20/2018   Iron deficiency anemia 12/15/2017   Hospital discharge follow-up 12/15/2017   Bilateral lower extremity edema 11/25/2017   Closed right hip fracture, sequela 11/14/2017   Right leg swelling 10/17/2017   Proximal leg weakness 10/17/2017   Herpes zoster without complication 54/65/0354   Encounter for preventive health examination 02/14/2017   Skin lesion of face 09/09/2015   Counseling regarding advanced directives and goals of care 12/26/2014   Do not resuscitate discussion 12/26/2014   Hypertension 08/07/2014   Encounter for Medicare annual wellness exam 11/18/2012   GERD (gastroesophageal reflux disease) 05/09/2012   Presbyacusis    BPH (benign prostatic hyperplasia)     Conditions to be addressed/monitored: HTN, DMII, and Dementia.  Level of Care Concerns, ADL/IADL Limitations, Limited Access to Caregiver, Cognitive Deficits, Memory Deficits, and Lacks Knowledge of [employer].  Care Plan : LCSW Plan of Care  Updates made by Francis Gaines, LCSW since 04/24/2021 12:00 AM     Problem: Improve My Quality of Life through [place] Placement.   Priority: High     Long-Range Goal: Improve My Quality of Life through [place] Placement.   Start Date: 05/10/2020  Expected End Date: 05/09/2021  This Visit's Progress: On track  Recent Progress: On track  Priority: High  Note:   Current Barriers:  Chronic disease  management support, education, resources, referrals and care management needs related to Mobility Issues, Fall Risk, Hypertension, Weakness, Fatigue and Vascular Dementia with Behavioral Disturbance.  Clinical Goal(s):  LCSW will assist  patient and wife with obtaining long-term care placement into a skilled nursing facility of choice. LCSW Interventions:  Collaboration with Primary Care Physician, Dr. Deborra Medina regarding development and update of comprehensive plan of care as evidenced by provider attestation and co-signature.   Collaboration with Primary Care Physician, Dr. Deborra Medina to request updated FL-2 Form. Interdisciplinary care team collaboration. Patient Goals/Self-Care Activities: [name] will continue to receive bi-weekly calls from LCSW, in an effort to coordinate long-term care placement for you in a skilled nursing facility, as well as arrange Palliative Care Services.   [name] and LCSW will continue to collaborate with [name] [phone]), Admissions Director at the [facility] in [place], to inquire about bed availability and grand opening date.   ~Grand opening has been further delayed.   LCSW collaboration with Primary Care Physician, Dr. Deborra Medina to request updated FL-2 Form, for long-term care placement purposes. Once updated FL-2 Form is received, LCSW will fax to all of the following long-term care facilities of interest, to try and pursue bed offers: ~[facility] ~[place] ~[place] collaboration with Dr. Christell Faith, [place] Provider with [facility] (# [phone]. [phone]), to request assistance with placement for patient at the [facility] in [place]. ~Second unsuccessful outreach call attempt.  HIPAA compliant message left on voicemail.  Will make third outreach call attempt in one week, if a return call is not received from Dr. Christell Faith in the meantime. LCSW collaboration with Dr. Christell Faith, [place] Physician with [facility], to request follow-up appointment for patient sooner than March of  2023. ~Second unsuccessful outreach call attempt.  HIPAA compliant message left on voicemail.  Will make third outreach call attempt in one week, if a return call is not received from Dr. Christell Faith in the meantime. [name] agreed to contact LCSW directly (# [phone]), if she has questions, needs assistance, or if additional social work needs are identified between now and our next scheduled telephone outreach call.   Follow-Up Plan:  LCSW will make contact with patient's wife on 05/09/2021 at 9:00 am.     [name] LCSW Licensed Clinical Social Worker [place]  [phone]

## 2021-05-09 ENCOUNTER — Ambulatory Visit: Payer: Medicare Other | Admitting: *Deleted

## 2021-05-09 DIAGNOSIS — R29898 Other symptoms and signs involving the musculoskeletal system: Secondary | ICD-10-CM

## 2021-05-09 DIAGNOSIS — H6123 Impacted cerumen, bilateral: Secondary | ICD-10-CM

## 2021-05-09 DIAGNOSIS — F01518 Vascular dementia, unspecified severity, with other behavioral disturbance: Secondary | ICD-10-CM

## 2021-05-09 DIAGNOSIS — F05 Delirium due to known physiological condition: Secondary | ICD-10-CM

## 2021-05-09 DIAGNOSIS — H9111 Presbycusis, right ear: Secondary | ICD-10-CM

## 2021-05-09 DIAGNOSIS — I10 Essential (primary) hypertension: Secondary | ICD-10-CM

## 2021-05-09 DIAGNOSIS — E669 Obesity, unspecified: Secondary | ICD-10-CM

## 2021-05-09 NOTE — Patient Instructions (Signed)
Visit Information  Thank you for taking time to visit with me today. Please don't hesitate to contact me if I can be of assistance to you before our next scheduled telephone appointment.  Following are the goals we discussed today:  Patient Goals/Self-Care Activities: Corey Todd will continue to receive bi-weekly calls from LCSW, in an effort to coordinate long-term care placement for you in either a skilled nursing facility. Corey Todd and LCSW will continue to collaborate with [name] [phone]), Admissions Director at the [facility] in [place], to inquire about bed availability and grand opening date.   ~ [place] opening has been further delayed.   Completed and signed FL-2 Form obtained on 05/08/2021 and faxed to the following list of skilled nursing facilities on 05/09/2021:             ~ [place] at [facility]             ~ [place]  ~ [place]             ~ [place]              ~ [place]             ~ [place]             ~ [facility] [place]             ~ [place]             ~ [facility]             ~ [place]  ~ [place]             ~ [place]  ~ [place]             ~ [place]             ~ [place] ~ [facility]              ~ [facility] [place] ~ [place] collaboration with Dr. Christell Faith, [place] Provider with [place] (# [phone]. [phone]), to request follow-up appointment sooner than March of 2023.    ~ Follow-up appointment with Dr. Christell Faith scheduled for 05/10/2021. Corey Todd agreed to contact LCSW directly, if she has questions, needs  assistance, or if additional social work needs are identified between now and our next scheduled telephone outreach call.   Follow-Up Plan:  Scheduling Care Guide will schedule follow-up telephone outreach call with patient's wife, Corey Todd with newly Embedded LCSW at [facility] @ [place], [place].  Please call the care guide team at [phone] if you need to cancel or reschedule your appointment.   If you are experiencing a Mental Health or [place] or need someone to talk to, please call the Suicide and Crisis Lifeline: 988 call the [place] National Suicide Prevention Lifeline: 774 210 3385 or TTY: 3528854441 TTY 423-475-0151) to talk to a trained counselor call 1-800-273-TALK (toll free, 24 hour hotline) go to [facility] [address], [place] [phone]) call the [place]: 8303731082 call 911   Patient verbalizes understanding of instructions and care plan provided today and agrees to view in [place]. Active  MyChart status confirmed with patient.    [name] LCSW Licensed Clinical Social Worker [place]  [phone]

## 2021-05-09 NOTE — Chronic Care Management (AMB) (Signed)
Chronic Care Management    Clinical Social Work Note  05/09/2021 Name: Corey Todd MRN: [id number] DOB: 10-23-1927  Corey Todd is a 88 y.o. year old male who is a primary care patient of [name], [name], MD. The CCM team was consulted to assist the patient with chronic disease management and/or care coordination needs related to: [employer], Level of Care Concerns, and Caregiver Stress.   Engaged with patient's wife by telephone for follow up visit in response to provider referral for social work chronic care management and care coordination services.   Consent to Services:  The patient was given information about Chronic Care Management services, agreed to services, and gave verbal consent prior to initiation of services.  Please see initial visit note for detailed documentation.   Patient agreed to services and consent obtained.   Assessment: Review of patient past medical history, allergies, medications, and health status, including review of relevant consultants reports was performed today as part of a comprehensive evaluation and provision of chronic care management and care coordination services.     SDOH (Social Determinants of Health) assessments and interventions performed:    Advanced Directives Status: Not addressed in this encounter.  CCM Care Plan  No Known Allergies  Outpatient Encounter Medications as of 05/09/2021  Medication Sig   furosemide (LASIX) 20 MG tablet TAKE ONE  TABLET EVERY DAY AS NEEDED FOR FLUID OR EDEMA (Patient not taking: Reported on 04/20/2021)   levothyroxine (SYNTHROID) 50 MCG tablet Take 1 tablet (50 mcg total) by mouth daily.   LORazepam (ATIVAN) 0.5 MG tablet Take 1 tablet (0.5 mg total) by mouth at bedtime.   QUEtiapine (SEROQUEL) 25 MG tablet Take 2 tablets (50 mg total) by mouth daily after supper.   No facility-administered encounter medications on file as of 05/09/2021.    Patient Active Problem List   Diagnosis Date  Noted   Incontinence of feces 04/20/2021   Weight gain 04/20/2021   Sundowning 05/02/2020   Cerumen impaction 12/23/2019   Acquired hypothyroidism 12/23/2019   Constipation 09/29/2018   Vascular dementia with behavioral disturbance 04/20/2018   Incontinence of urine 04/20/2018   Iron deficiency anemia 12/15/2017   Hospital discharge follow-up 12/15/2017   Bilateral lower extremity edema 11/25/2017   Closed right hip fracture, sequela 11/14/2017   Right leg swelling 10/17/2017   Proximal leg weakness 10/17/2017   Herpes zoster without complication 33/38/3291   Encounter for preventive health examination 02/14/2017   Skin lesion of face 09/09/2015   Counseling regarding advanced directives and goals of care 12/26/2014   Do not resuscitate discussion 12/26/2014   Hypertension 08/07/2014   Encounter for Medicare annual wellness exam 11/18/2012   GERD (gastroesophageal reflux disease) 05/09/2012   Presbyacusis    BPH (benign prostatic hyperplasia)     Conditions to be addressed/monitored: HTN and Dementia.  Level of Care Concerns, ADL/IADL Limitations, Limited Access to Caregiver, Cognitive Deficits, Memory Deficits, and Lacks Knowledge of [employer].  Care Plan : LCSW Plan of Care  Updates made by Francis Gaines, LCSW since 05/09/2021 12:00 AM     Problem: Improve My Quality of Life through [place] Placement.   Priority: High     Long-Range Goal: Improve My Quality of Life through [place] Placement.   Start Date: 05/10/2020  Expected End Date: 08/08/2021  This Visit's Progress: On track  Recent Progress: On track  Priority: High  Note:   Current Barriers:  Chronic disease management support, education, resources,  referrals and care management needs related to Mobility Issues, Fall Risk, Hypertension, Weakness, Fatigue and Vascular Dementia with Behavioral Disturbance.  Clinical Goal(s):  LCSW will assist patient and wife with  obtaining long-term care placement into a skilled nursing facility of choice. LCSW Interventions:  Collaboration with Primary Care Physician, Dr. Deborra Medina regarding development and update of comprehensive plan of care as evidenced by provider attestation and co-signature.   Interdisciplinary care team collaboration. Patient Goals/Self-Care Activities: Mrs. Lince will continue to receive bi-weekly calls from LCSW, in an effort to coordinate long-term care placement for you in either a skilled nursing facility. Mrs. Hilgers and LCSW will continue to collaborate with [name] [phone]), Admissions Director at the [facility] in [place], to inquire about bed availability and grand opening date.   ~ [place] opening has been further delayed.   Completed and signed FL-2 Form obtained on 05/08/2021 and faxed to the following list of skilled nursing facilities on 05/09/2021:             ~ [place] at [facility]             ~ [place]  ~ [place]             ~ [facility]              ~ [place]             ~ [place]             ~ [facility] at [place]             ~ [place]             ~ [facility]             ~ [place]  ~ [place]             ~ [place]  ~ [place]             ~ [place]             ~ [place] ~ [facility]              ~  [facility] [place] ~ [place] collaboration with Dr. Christell Faith, [place] Provider with [facility] (# [phone]), to request follow-up appointment sooner than March of 2023.    ~ Follow-up appointment with Dr. Christell Faith scheduled for  05/10/2021. Mrs. Schlender agreed to contact LCSW directly, if she has questions, needs assistance, or if additional social work needs are identified between now and our next scheduled telephone outreach call.   Follow-Up Plan:  Scheduling Care Guide will schedule follow-up telephone outreach call with patient's wife, [name] with newly Embedded LCSW at [facility] @ [place], [place].     [name] LCSW Licensed Clinical Social Worker [place]  [phone]

## 2021-05-16 DIAGNOSIS — I10 Essential (primary) hypertension: Secondary | ICD-10-CM | POA: Diagnosis not present

## 2021-05-16 DIAGNOSIS — F05 Delirium due to known physiological condition: Secondary | ICD-10-CM

## 2021-05-16 DIAGNOSIS — F01518 Vascular dementia, unspecified severity, with other behavioral disturbance: Secondary | ICD-10-CM | POA: Diagnosis not present

## 2021-05-22 ENCOUNTER — Other Ambulatory Visit: Payer: Self-pay | Admitting: Internal Medicine

## 2021-05-22 MED ORDER — LORAZEPAM 0.5 MG PO TABS: 0.5000 mg | ORAL_TABLET | Freq: Every day | ORAL | 5 refills | Status: DC

## 2021-05-22 NOTE — Telephone Encounter (Signed)
Refilled: 04/20/2021 Last OV: 04/20/2021 Next OV: not scheduled

## 2021-05-22 NOTE — Telephone Encounter (Signed)
Patient wife  [name] called in patient is in need of medication Lorazepam 0.5 MG  [facility], Patient wife stated  may need to increase the dose didn't do too much good@ 0.5MG . Patient wife states patient is going to a nursing home this Friday for 4 weeks.

## 2021-05-24 ENCOUNTER — Ambulatory Visit (INDEPENDENT_AMBULATORY_CARE_PROVIDER_SITE_OTHER): Payer: Medicare Other | Admitting: *Deleted

## 2021-05-24 DIAGNOSIS — F01518 Vascular dementia, unspecified severity, with other behavioral disturbance: Secondary | ICD-10-CM

## 2021-05-24 DIAGNOSIS — R29898 Other symptoms and signs involving the musculoskeletal system: Secondary | ICD-10-CM

## 2021-05-24 DIAGNOSIS — I10 Essential (primary) hypertension: Secondary | ICD-10-CM

## 2021-05-24 NOTE — Chronic Care Management (AMB) (Signed)
Chronic Care Management    Clinical Social Work Note  05/24/2021 Name: Corey Todd MRN: [id number] DOB: 10-Jul-1927  Corey Todd is a 88 y.o. year old male who is a primary care patient of [name], [name], MD. The CCM team was consulted to assist the patient with chronic disease management and/or care coordination needs related to: Level of Care Concerns.   Collaboration with patient's spouse  for follow up visit in response to provider referral for social work chronic care management and care coordination services.   Consent to Services:  The patient was given information about Chronic Care Management services, agreed to services, and gave verbal consent prior to initiation of services.  Please see initial visit note for detailed documentation.   Patient agreed to services and consent obtained.   Assessment: Review of patient past medical history, allergies, medications, and health status, including review of relevant consultants reports was performed today as part of a comprehensive evaluation and provision of chronic care management and care coordination services.     SDOH (Social Determinants of Health) assessments and interventions performed:    Advanced Directives Status: Not addressed in this encounter.  CCM Care Plan  No Known Allergies  Outpatient Encounter Medications as of 05/24/2021  Medication Sig   furosemide (LASIX) 20 MG tablet TAKE ONE  TABLET EVERY DAY AS NEEDED FOR FLUID OR EDEMA (Patient not taking: Reported on 04/20/2021)   levothyroxine (SYNTHROID) 50 MCG tablet Take 1 tablet (50 mcg total) by mouth daily.   LORazepam (ATIVAN) 0.5 MG tablet Take 1 tablet (0.5 mg total) by mouth at bedtime.   QUEtiapine (SEROQUEL) 25 MG tablet Take 2 tablets (50 mg total) by mouth daily after supper.   No facility-administered encounter medications on file as of 05/24/2021.    Patient Active Problem List   Diagnosis Date Noted   Incontinence of feces 04/20/2021   Weight  gain 04/20/2021   Sundowning 05/02/2020   Cerumen impaction 12/23/2019   Acquired hypothyroidism 12/23/2019   Constipation 09/29/2018   Vascular dementia with behavioral disturbance 04/20/2018   Incontinence of urine 04/20/2018   Iron deficiency anemia 12/15/2017   Hospital discharge follow-up 12/15/2017   Bilateral lower extremity edema 11/25/2017   Closed right hip fracture, sequela 11/14/2017   Right leg swelling 10/17/2017   Proximal leg weakness 10/17/2017   Herpes zoster without complication 62/37/6283   Encounter for preventive health examination 02/14/2017   Skin lesion of face 09/09/2015   Counseling regarding advanced directives and goals of care 12/26/2014   Do not resuscitate discussion 12/26/2014   Hypertension 08/07/2014   Encounter for Medicare annual wellness exam 11/18/2012   GERD (gastroesophageal reflux disease) 05/09/2012   Presbyacusis    BPH (benign prostatic hyperplasia)     Conditions to be addressed/monitored:  HTN and Dementia.  Level of Care Concerns, ADL/IADL Limitations, Limited Access to Caregiver, Cognitive Deficits, Memory Deficits, and Lacks Knowledge of Community Resources Care Plan : LCSW Plan of Care  Updates made by Vern Claude, LCSW since 05/24/2021 12:00 AM     Problem: Improve My Quality of Life through [place] Placement.   Priority: High     Long-Range Goal: Improve My Quality of Life through [place] Placement.   Start Date: 05/10/2020  Expected End Date: 08/08/2021  Recent Progress: On track  Priority: High  Note:   Current Barriers:  Chronic disease management support, education, resources, referrals and care management needs related to Mobility Issues, Fall Risk, Hypertension,  Weakness, Fatigue and Vascular Dementia with Behavioral Disturbance.  Clinical Goal(s):  LCSW will assist patient and wife with obtaining long-term care placement into a skilled nursing facility of choice. LCSW  Interventions:  Collaboration with Primary Care Physician, Dr. Deborra Medina regarding development and update of comprehensive plan of care as evidenced by provider attestation and co-signature. Patient's spouse confirmed that patient had a fall last week, EMT's had to be contacted to help him to his feet   Patient's spouse confirmed that patient now has a rehab bed available at [facility] scheduled for 05/26/21-current authorization 05/26/21-06/23/21-patient's son and spouse to provide transport Patient's spouse confirmed that she will continue to work with the [place] on long term care coverage approval  Patient's spouse verbalized no additional needs at this time due to plan to transition to rehab on 05/26/21. However assistance may be needed with long term care coverage. Plan to follow up within 14 business days to assess for additional needs  Patient Goals/Self-Care Activities: Mrs. Dahms will continue to receive bi-weekly calls from LCSW, in an effort to coordinate long-term care placement  Mrs. Hagemann  will continue to collaborate with [name] [phone]), Admissions Director at the [facility] in [place], to inquire about bed availability and grand opening date. Mrs. Moise to continue to work with the [facility] for assistance with long term care placement Mrs. Oboyle agreed to contact LCSW directly, if she has questions, needs assistance, or if additional social work needs are identified between now and our next scheduled telephone outreach call.           Follow Up Plan: SW will follow up with patient by phone over the next 14 business days.       [address] [place] [phone]

## 2021-05-24 NOTE — Patient Instructions (Signed)
Visit Information  Thank you for taking time to visit with me today. Please don't hesitate to contact me if I can be of assistance to you before our next scheduled telephone appointment.  Following are the goals we discussed today:  Mrs. Lindell will continue to receive bi-weekly calls from LCSW, in an effort to coordinate long-term care placement for you in either a skilled nursing facility. Mrs. Boschert and LCSW will continue to collaborate with [name] [phone]), Admissions Director at the [facility] in [place], to inquire about bed availability and grand opening date.  Patient to present to [facility] for short term stay on Friday 05/26/21-current authorization 05/26/21-06/23/21       Our next appointment is by telephone on 06/08/21 at 3:00pm  Please call the care guide team at [phone] if you need to cancel or reschedule your appointment.   If you are experiencing a Mental Health or [place] or need someone to talk to, please call the Suicide and Crisis Lifeline: 988   Patient verbalizes understanding of instructions and care plan provided today and agrees to view in [place]. Active MyChart status confirmed with patient.    Telephone follow up appointment with care management team member scheduled for: 06/08/21  [employer], [place] [phone]

## 2021-05-29 DIAGNOSIS — K5900 Constipation, unspecified: Secondary | ICD-10-CM | POA: Diagnosis not present

## 2021-05-29 DIAGNOSIS — F03C18 Unspecified dementia, severe, with other behavioral disturbance: Secondary | ICD-10-CM | POA: Diagnosis not present

## 2021-06-07 ENCOUNTER — Telehealth: Payer: Self-pay | Admitting: *Deleted

## 2021-06-07 ENCOUNTER — Telehealth: Payer: Medicare Other

## 2021-06-07 NOTE — Telephone Encounter (Signed)
°  Care Management   Follow Up Note   06/07/2021 Name: Corey Todd MRN: [id number] DOB: 18-Dec-1927   Referred by: Crecencio Mc, MD Reason for referral : No chief complaint on file.   An unsuccessful telephone outreach was attempted today. The patient was referred to the case management team for assistance with care management and care coordination.   Follow Up Plan: Telephone follow up appointment with care management team member scheduled for: 06/14/21  [employer], [place] [phone]

## 2021-06-08 ENCOUNTER — Ambulatory Visit: Payer: Medicare Other | Admitting: *Deleted

## 2021-06-08 DIAGNOSIS — F01518 Vascular dementia, unspecified severity, with other behavioral disturbance: Secondary | ICD-10-CM

## 2021-06-08 DIAGNOSIS — I10 Essential (primary) hypertension: Secondary | ICD-10-CM

## 2021-06-08 NOTE — Chronic Care Management (AMB) (Signed)
Chronic Care Management    Clinical Social Work Note  06/08/2021 Name: Corey Todd MRN: [id number] DOB: 10/11/1927  Corey Todd is a 88 y.o. year old male who is a primary care patient of [name], [name], MD. The CCM team was consulted to assist the patient with chronic disease management and/or care coordination needs related to: [employer] .   Collaboration with patient's spouse  for follow up visit in response to provider referral for social work chronic care management and care coordination services.   Consent to Services:  The patient was given information about Chronic Care Management services, agreed to services, and gave verbal consent prior to initiation of services.  Please see initial visit note for detailed documentation.   Patient agreed to services and consent obtained.   Assessment: Review of patient past medical history, allergies, medications, and health status, including review of relevant consultants reports was performed today as part of a comprehensive evaluation and provision of chronic care management and care coordination services.     SDOH (Social Determinants of Health) assessments and interventions performed:    Advanced Directives Status: Not addressed in this encounter.  CCM Care Plan  No Known Allergies  Outpatient Encounter Medications as of 06/08/2021  Medication Sig   furosemide (LASIX) 20 MG tablet TAKE ONE  TABLET EVERY DAY AS NEEDED FOR FLUID OR EDEMA (Patient not taking: Reported on 04/20/2021)   levothyroxine (SYNTHROID) 50 MCG tablet Take 1 tablet (50 mcg total) by mouth daily.   LORazepam (ATIVAN) 0.5 MG tablet Take 1 tablet (0.5 mg total) by mouth at bedtime.   QUEtiapine (SEROQUEL) 25 MG tablet Take 2 tablets (50 mg total) by mouth daily after supper.   No facility-administered encounter medications on file as of 06/08/2021.    Patient Active Problem List   Diagnosis Date Noted   Incontinence of feces 04/20/2021   Weight  gain 04/20/2021   Sundowning 05/02/2020   Cerumen impaction 12/23/2019   Acquired hypothyroidism 12/23/2019   Constipation 09/29/2018   Vascular dementia with behavioral disturbance 04/20/2018   Incontinence of urine 04/20/2018   Iron deficiency anemia 12/15/2017   Hospital discharge follow-up 12/15/2017   Bilateral lower extremity edema 11/25/2017   Closed right hip fracture, sequela 11/14/2017   Right leg swelling 10/17/2017   Proximal leg weakness 10/17/2017   Herpes zoster without complication 46/80/3212   Encounter for preventive health examination 02/14/2017   Skin lesion of face 09/09/2015   Counseling regarding advanced directives and goals of care 12/26/2014   Do not resuscitate discussion 12/26/2014   Hypertension 08/07/2014   Encounter for Medicare annual wellness exam 11/18/2012   GERD (gastroesophageal reflux disease) 05/09/2012   Presbyacusis    BPH (benign prostatic hyperplasia)     Conditions to be addressed/monitored:  HTN and Dementia.  Level of Care Concerns, ADL/IADL Limitations, Limited Access to Caregiver, Cognitive Deficits, Memory Deficits, and Lacks Knowledge of Community Resources Care Plan : LCSW Plan of Care  Updates made by Vern Claude, LCSW since 06/08/2021 12:00 AM     Problem: Improve My Quality of Life through [place] [facility].   Priority: High     Long-Range Goal: Improve My Quality of Life through [facility] [facility].   Start Date: 05/10/2020  Expected End Date: 08/08/2021  This Visit's Progress: On track  Recent Progress: On track  Priority: High  Note:   Current Barriers:  Chronic disease management support, education, resources, referrals and care management needs related to  Mobility Issues, Fall Risk, Hypertension, Weakness, Fatigue and Vascular Dementia with Behavioral Disturbance.  Clinical Goal(s):  LCSW will assist patient and wife with obtaining long-term care placement into a skilled nursing  facility of choice. LCSW Interventions:  Collaboration with Primary Care Physician, Dr. Deborra Medina regarding development and update of comprehensive plan of care as evidenced by provider attestation and co-signature.   Patient's spouse confirmed that patient is now in rehab  at [facility] authorization 05/26/21-06/23/21 Patient's spouse confirmed that she will continue to work with the [place] on long term care coverage approval  Patient's spouse discussed plan for patient to return home following rehab, currently has [facility] in place for in home care-approved through the [place] for 16 hours per week-has care concerns, patient's spouse encouraged  to discuss with administration before care resumes Patient's spouse verbalized assistance needs with coordinating in home care for patient's return home.  Plan to follow up within 14 business days to assist  Patient Goals/Self-Care Activities: Mrs. Martine will continue to receive bi-weekly calls from LCSW, in an effort to coordinate long-term care placement  Mrs. Klemz  will continue to collaborate with [name] [phone]), Admissions Director at the [facility] in [place], to inquire about bed availability and grand opening date. Mrs. Gause to continue to work with the [facility] for assistance with long term care placement Mrs. Larch agreed to contact LCSW directly, if she has questions, needs assistance, or if additional social work needs are identified between now and our next scheduled telephone outreach call.           Follow Up Plan: SW will follow up with patient by phone over the next 14 business days      [employer], [place] [phone]

## 2021-06-08 NOTE — Patient Instructions (Signed)
Visit Information  Thank you for taking time to visit with me today. Please don't hesitate to contact me if I can be of assistance to you before our next scheduled telephone appointment.  Following are the goals we discussed today:  Corey Todd will continue to receive bi-weekly calls from LCSW, in an effort to coordinate long-term care placement for you in either a skilled nursing facility. Corey Todd and LCSW will continue to collaborate with Corey Todd [phone]), Admissions Director at the [facility] in [place], to inquire about bed availability and grand opening date.  Patient to continue in rehab at [facility] for short term stay-current authorization 05/26/21-3/10/2  Our next appointment is by telephone on 06/14/21 at 11;30am  Please call the care guide team at [phone] if you need to cancel or reschedule your appointment.   If you are experiencing a Mental Health or [place] or need someone to talk to, please call the Suicide and Crisis Lifeline: 988   Patient verbalizes understanding of instructions and care plan provided today and agrees to view in [place]. Active MyChart status confirmed with patient.    Telephone follow up appointment with care management team member scheduled for: 06/14/21  [employer], [place] [phone]

## 2021-06-13 DIAGNOSIS — F01518 Vascular dementia, unspecified severity, with other behavioral disturbance: Secondary | ICD-10-CM

## 2021-06-13 DIAGNOSIS — I10 Essential (primary) hypertension: Secondary | ICD-10-CM

## 2021-06-14 ENCOUNTER — Telehealth: Payer: Medicare Other

## 2021-06-14 ENCOUNTER — Ambulatory Visit (INDEPENDENT_AMBULATORY_CARE_PROVIDER_SITE_OTHER): Payer: Medicare Other | Admitting: *Deleted

## 2021-06-14 DIAGNOSIS — I10 Essential (primary) hypertension: Secondary | ICD-10-CM

## 2021-06-14 DIAGNOSIS — F01518 Vascular dementia, unspecified severity, with other behavioral disturbance: Secondary | ICD-10-CM

## 2021-06-14 DIAGNOSIS — R29898 Other symptoms and signs involving the musculoskeletal system: Secondary | ICD-10-CM

## 2021-06-14 NOTE — Chronic Care Management (AMB) (Signed)
?Chronic Care Management  ? ? Clinical Social Work Note ? ?06/14/2021 ?Name: Corey Todd MRN: [id number] DOB: 1928/04/08 ? ?Corey Todd is a 88 y.o. year old male who is a primary care patient of Tullo, [name], MD. The CCM team was consulted to assist the patient with chronic disease management and/or care coordination needs related to: [employer] .  ? ?Collaboration with patient's spouse  for follow up visit in response to provider referral for social work chronic care management and care coordination services.  ? ?Consent to Services:  ?The patient was given information about Chronic Care Management services, agreed to services, and gave verbal consent prior to initiation of services.  Please see initial visit note for detailed documentation.  ? ?Patient agreed to services and consent obtained.  ? ?Assessment: Review of patient past medical history, allergies, medications, and health status, including review of relevant consultants reports was performed today as part of a comprehensive evaluation and provision of chronic care management and care coordination services.    ? ?SDOH (Social Determinants of Health) assessments and interventions performed:   ? ?Advanced Directives Status: Not addressed in this encounter. ? ?CCM Care Plan ? ?No Known Allergies ? ?Outpatient Encounter Medications as of 06/14/2021  ?Medication Sig  ? furosemide (LASIX) 20 MG tablet TAKE ONE  TABLET EVERY DAY AS NEEDED FOR FLUID OR EDEMA (Patient not taking: Reported on 04/20/2021)  ? levothyroxine (SYNTHROID) 50 MCG tablet Take 1 tablet (50 mcg total) by mouth daily.  ? LORazepam (ATIVAN) 0.5 MG tablet Take 1 tablet (0.5 mg total) by mouth at bedtime.  ? QUEtiapine (SEROQUEL) 25 MG tablet Take 2 tablets (50 mg total) by mouth daily after supper.  ? ?No facility-administered encounter medications on file as of 06/14/2021.  ? ? ?Patient Active Problem List  ? Diagnosis Date Noted  ? Incontinence of feces 04/20/2021  ? Weight gain  04/20/2021  ? Sundowning 05/02/2020  ? Cerumen impaction 12/23/2019  ? Acquired hypothyroidism 12/23/2019  ? Constipation 09/29/2018  ? Vascular dementia with behavioral disturbance 04/20/2018  ? Incontinence of urine 04/20/2018  ? Iron deficiency anemia 12/15/2017  ? Hospital discharge follow-up 12/15/2017  ? Bilateral lower extremity edema 11/25/2017  ? Closed right hip fracture, sequela 11/14/2017  ? Right leg swelling 10/17/2017  ? Proximal leg weakness 10/17/2017  ? Herpes zoster without complication 40/11/6759  ? Encounter for preventive health examination 02/14/2017  ? Skin lesion of face 09/09/2015  ? Counseling regarding advanced directives and goals of care 12/26/2014  ? Do not resuscitate discussion 12/26/2014  ? Hypertension 08/07/2014  ? Encounter for Medicare annual wellness exam 11/18/2012  ? GERD (gastroesophageal reflux disease) 05/09/2012  ? Presbyacusis   ? BPH (benign prostatic hyperplasia)   ? ? ?Conditions to be addressed/monitored:  ?HTN and Dementia.  Level of Care Concerns, ADL/IADL Limitations, Limited Access to Caregiver, Cognitive Deficits, Memory Deficits, and Lacks Knowledge of Community Resources ?Care Plan : LCSW Plan of Care  ?Updates made by Vern Claude, LCSW since 06/14/2021 12:00 AM  ?  ? ?Problem: Improve My Quality of Life through [place] [facility].   ?Priority: High  ?  ? ?Long-Range Goal: Improve My Quality of Life through [place] [facility].   ?Start Date: 05/10/2020  ?Expected End Date: 08/08/2021  ?This Visit's Progress: On track  ?Recent Progress: On track  ?Priority: High  ?Note:   ?Current Barriers:  ?Chronic disease management support, education, resources, referrals and care management needs related to  Mobility Issues, Fall Risk, Hypertension, Weakness, Fatigue and Vascular Dementia with Behavioral Disturbance.  ?Clinical Goal(s):  ?LCSW will assist patient and wife with obtaining long-term care placement into a skilled nursing  facility of choice. ?LCSW Interventions:  ?Collaboration with Primary Care Physician, Dr. Teresa Tullo regarding development and update of comprehensive plan of care as evidenced by provider attestation and co-signature. ?  Patient's spouse confirmed that patient is now in rehab  at [facility]-current authorization 05/26/21-06/23/21 ?Patient's spouse confirmed that she will [facility] to [facility] for patient following patient's rehab stay and based on her own medical needs will plan to be placed with him ?Patient's spouse continues to confirm  plan that while long term care placement is being arranged, patient will continue to receive personal care services through [facility] in place for in home care-[facility] for 16 hours per week ?Patient's spouse agreeable to discuss any concerns regarding patient's care in the home with [facility] leadership ?Patient's spouse verbalized having no additional assistance needs and will call if assistance is needed with long term care plan ? ?Patient Goals/Self-Care Activities: ?Mrs. Genova will continue to receive bi-weekly calls from LCSW, in an effort to coordinate long-term care placement  ?Mrs. Weipert  will continue to collaborate with Kit Ferguson (# [phone]), Admissions Director at the [facility] in [place], to inquire about bed availability and grand opening date. ?Mrs. Broers to continue to work with the [facility] for assistance with long term care placement ?Mrs. Yorio agreed to contact LCSW directly, if she has questions, needs assistance, or if additional social work needs are identified between now and our next scheduled telephone outreach call.   ? ? ? ?  ?  ? ?Follow Up Plan:  Client's spouse will contact this social worker with any additional community resource needs. ?     ? , LCSW ?[facility] [place] ?[phone] ? ? ? ?

## 2021-06-14 NOTE — Patient Instructions (Signed)
Visit Information ? ?Thank you for taking time to visit with me today. Please don't hesitate to contact me if I can be of assistance to you before our next scheduled telephone appointment. ? ?Following are the goals we discussed today:  ?Patient's spouse will continue to collaborate with [name] [phone]), Admissions Director at the [facility] in [place], to inquire about bed availability and grand opening date.  ?Patient's spouse will also work with  the [place] to explore additional long term care facilities ?Patient to continue in rehab at [facility] for short term stay-current authorization 05/26/21-06/23/21  ?  ? ? ?If you are experiencing a Mental Health or [place] or need someone to talk to, please call the Suicide and Crisis Lifeline: 988  ? ?Patient verbalizes understanding of instructions and care plan provided today and agrees to view in [place]. Active MyChart status confirmed with patient.   ? ?Patient's spouse will continue to work with the the Education officer, museum from [place] to coordinate long term care ? ?Hershall Benkert, LCSW ?[place] ?[phone] ? ?

## 2021-06-16 ENCOUNTER — Telehealth: Payer: Self-pay | Admitting: Internal Medicine

## 2021-06-16 NOTE — Telephone Encounter (Signed)
Pt wife called stating that she need a letter from provider stating that pt has vascular problems since 2019 to give to the [place] for disability.  ?Pt wife wants it emailed [email or web address] ?Wife number [phone] ?

## 2021-06-19 NOTE — Telephone Encounter (Signed)
Spoke with pt's wife and she stated that she was able to get the letter printed off mychart.  ?

## 2021-06-19 NOTE — Telephone Encounter (Signed)
Letter of disability printed and sent via mychart.  It cannot be sent to his e mail account for reasons of security  ?

## 2021-07-14 DIAGNOSIS — I10 Essential (primary) hypertension: Secondary | ICD-10-CM

## 2021-07-14 DIAGNOSIS — F01518 Vascular dementia, unspecified severity, with other behavioral disturbance: Secondary | ICD-10-CM

## 2021-08-25 DIAGNOSIS — M79674 Pain in right toe(s): Secondary | ICD-10-CM | POA: Diagnosis not present

## 2021-08-25 DIAGNOSIS — R600 Localized edema: Secondary | ICD-10-CM | POA: Diagnosis not present

## 2021-08-25 DIAGNOSIS — M778 Other enthesopathies, not elsewhere classified: Secondary | ICD-10-CM | POA: Diagnosis not present

## 2021-08-25 DIAGNOSIS — L600 Ingrowing nail: Secondary | ICD-10-CM | POA: Diagnosis not present

## 2021-08-25 DIAGNOSIS — B351 Tinea unguium: Secondary | ICD-10-CM | POA: Diagnosis not present

## 2021-08-25 DIAGNOSIS — M79675 Pain in left toe(s): Secondary | ICD-10-CM | POA: Diagnosis not present

## 2021-10-10 ENCOUNTER — Telehealth: Payer: Self-pay | Admitting: Internal Medicine

## 2021-10-11 MED ORDER — LORAZEPAM 0.5 MG PO TABS: 0.5000 mg | ORAL_TABLET | Freq: Two times a day (BID) | ORAL | 2 refills | Status: AC

## 2021-10-11 NOTE — Telephone Encounter (Signed)
Spoke with pt's wife and the first 15 minute appt is not until 10/18/2021. Wife stated that she can not wait until then. She stated that her husband is all over the place during the day and digging in the trash cans. Pt's wife wants to know if she can increase the night seroquel and lorazepam or if there is something that can added during the day.

## 2021-10-11 NOTE — Telephone Encounter (Signed)
Yes,  she can increase his lorazepam to two times daily . I have sent a new rx to [facility].

## 2021-10-11 NOTE — Addendum Note (Signed)
Addended by: Crecencio Mc on: 10/11/2021 04:52 PM   Modules accepted: Orders

## 2021-10-11 NOTE — Telephone Encounter (Signed)
Pt's wife is aware that the lorazepam can be increased to twice a day. She gave a verbal understanding.

## 2021-10-18 ENCOUNTER — Encounter: Payer: Self-pay | Admitting: Internal Medicine

## 2021-10-18 ENCOUNTER — Ambulatory Visit (INDEPENDENT_AMBULATORY_CARE_PROVIDER_SITE_OTHER): Payer: Medicare Other | Admitting: Internal Medicine

## 2021-10-18 DIAGNOSIS — F05 Delirium due to known physiological condition: Secondary | ICD-10-CM

## 2021-10-18 DIAGNOSIS — R29898 Other symptoms and signs involving the musculoskeletal system: Secondary | ICD-10-CM

## 2021-10-18 DIAGNOSIS — F01518 Vascular dementia, unspecified severity, with other behavioral disturbance: Secondary | ICD-10-CM | POA: Diagnosis not present

## 2021-10-18 MED ORDER — QUETIAPINE FUMARATE 50 MG PO TABS: 50.0000 mg | ORAL_TABLET | Freq: Three times a day (TID) | ORAL | 0 refills | Status: AC

## 2021-10-18 NOTE — Progress Notes (Signed)
Subjective:  Patient ID: Corey Todd, male    DOB: May 22, 1927  Age: 88 y.o. MRN: [id number]  CC: Diagnoses of Sundowning, Proximal leg weakness, and Vascular dementia with behavioral disturbance ([place]) were pertinent to this visit.   HPI Corey Todd presents for follow up on dementia with nighttime agitation .  He is accompanied by his son.  His principal caregiver is is wife [name] , who did not come today but is requesting a  change in medication to manage his evening agitation  Chief Complaint  Patient presents with   Follow-up    Follow up for dementia meds   Mr  Glascoe is a 88 yr old male  who was diagnosed with vascular dementia  in February 2021.  He is cared for at home by his wife [name], who has limited assistance from her son and from a paid caregiver provided for by the [place] for several hours per week.  He is incontinent of feces and urine and requires assistance with matters of hygiene, bathing, medication,  and meal preparation.    He developed sundowning in January 2022 and for the past 6 months has required increased use of anxiolytics and antipsychotics to manage his behavior.  Last week his wife requested a change in his medications to further manage his agitation and combativeness at night.  Lorazepam 0.5 mg every  12 hours was added to 50 mg seroquel at dinnertime. His son  , who I spresent today,  states that his father sleeps 12 to 16 hours daily and becomes very agitated and verbally combative around dinner time.      Outpatient Medications Prior to Visit  Medication Sig Dispense Refill   furosemide (LASIX) 20 MG tablet TAKE ONE  TABLET EVERY DAY AS NEEDED FOR FLUID OR EDEMA 90 tablet 1   levothyroxine (SYNTHROID) 50 MCG tablet Take 1 tablet (50 mcg total) by mouth daily. 90 tablet 3   LORazepam (ATIVAN) 0.5 MG tablet Take 1 tablet (0.5 mg total) by mouth 2 (two) times daily. 60 tablet 2   QUEtiapine (SEROQUEL) 25 MG tablet Take 2 tablets (50 mg total) by  mouth daily after supper. 60 tablet 5   No facility-administered medications prior to visit.    Review of Systems;  Patient denies headache, fevers, malaise, unintentional weight loss, skin rash, eye pain, sinus congestion and sinus pain, sore throat, dysphagia,  hemoptysis , cough, dyspnea, wheezing, chest pain, palpitations, orthopnea, edema, abdominal pain, nausea, melena, diarrhea, constipation, flank pain, dysuria, hematuria, urinary  Frequency, nocturia, numbness, tingling, seizures,  Focal weakness, Loss of consciousness,  Tremor, insomnia, depression, anxiety, and suicidal ideation.      Objective:  BP 118/62 (BP Location: Left Arm, Patient Position: Sitting, Cuff Size: Normal)   Pulse 77   Temp 98.1 F (36.7 C) (Oral)   Resp (!) 21   Ht '5\' 5"'$  (1.651 m)   Wt 171 lb 1.9 oz (77.6 kg)   SpO2 96%   BMI 28.48 kg/m   BP Readings from Last 3 Encounters:  10/18/21 118/62  02/03/21 122/74  12/01/20 126/74    Wt Readings from Last 3 Encounters:  10/18/21 171 lb 1.9 oz (77.6 kg)  04/20/21 167 lb 9.6 oz (76 kg)  02/03/21 167 lb 9.6 oz (76 kg)    General appearance: alert, responsive, cooperative and appears stated age.  Some mild agitation noted.  Ears: normal TM's and external ear canals both ears Throat: lips, mucosa, and tongue normal; teeth and gums  normal Neck: no adenopathy, no carotid bruit, supple, symmetrical, trachea midline and thyroid not enlarged, symmetric, no tenderness/mass/nodules Back: symmetric, no curvature. ROM normal. No CVA tenderness. Lungs: clear to auscultation bilaterally Heart: regular rate and rhythm, S1, S2 normal, no murmur, click, rub or gallop Abdomen: soft, non-tender; bowel sounds normal; no masses,  no organomegaly Pulses: 2+ and symmetric Skin: Skin color, texture, turgor normal. No rashes or lesions Lymph nodes: Cervical, supraclavicular, and axillary nodes normal.  Lab Results  Component Value Date   HGBA1C 5.4 05/26/2019   HGBA1C  5.6 10/16/2017   HGBA1C 5.4 12/26/2015    Lab Results  Component Value Date   CREATININE 1.07 12/23/2019   CREATININE 1.20 08/24/2019   CREATININE 1.20 05/26/2019    Lab Results  Component Value Date   WBC 8.4 12/23/2019   HGB 13.0 12/23/2019   HCT 39.3 12/23/2019   PLT 264.0 12/23/2019   GLUCOSE 88 12/23/2019   CHOL 146 11/21/2012   TRIG 108.0 11/21/2012   HDL 48.80 11/21/2012   LDLCALC 76 11/21/2012   ALT 10 12/23/2019   AST 13 12/23/2019   NA 140 12/23/2019   K 3.7 12/23/2019   CL 105 12/23/2019   CREATININE 1.07 12/23/2019   BUN 19 12/23/2019   CO2 27 12/23/2019   TSH 3.73 02/17/2020   INR 1.02 11/14/2017   HGBA1C 5.4 05/26/2019   MICROALBUR <0.7 06/26/2016    MR Brain Wo Contrast  Result Date: 06/06/2019 CLINICAL DATA:  Subacute head trauma with progressive cognitive decline. EXAM: MRI HEAD WITHOUT CONTRAST TECHNIQUE: Multiplanar, multiecho pulse sequences of the brain and surrounding structures were obtained without intravenous contrast. COMPARISON:  None. FINDINGS: BRAIN: No acute infarct, acute hemorrhage or extra-axial collection. Multifocal white matter hyperintensity, most commonly due to chronic ischemic microangiopathy. There is generalized atrophy without lobar predilection. Midline structures are normal. VASCULAR: Major flow voids are preserved. Susceptibility-sensitive sequences show no chronic microhemorrhage or superficial siderosis. SKULL AND UPPER CERVICAL SPINE: Normal calvarium and skull base. Visualized upper cervical spine and soft tissues are normal. SINUSES/ORBITS: No fluid levels or advanced mucosal thickening. No mastoid or middle ear effusion. Normal orbits. IMPRESSION: Generalized atrophy and findings of chronic small vessel disease without acute intracranial abnormality. Electronically Signed   By: Ulyses Jarred M.D.   On: 06/06/2019 21:07    Assessment & Plan:   Problem List Items Addressed This Visit     Vascular dementia with behavioral  disturbance [facility])    His wife can no longer care for him at home with the limited assistance provided by the [place] and the increased nocturnal agitation reported.  Medication changes made,  And FL-2 form signed       Relevant Medications   QUEtiapine (SEROQUEL) 50 MG tablet   Sundowning    Increasing dose of  seroquel to 100 mg at 5 pm .   Adding a prn dose of Seroquel  50 mg at bedtime if needed.   continue lorazepam 0.5 mg bid   .        Proximal leg weakness    Secondary to sedentary lifestyle.  Continue to encourage the aide to walk him twice weekly       I spent a total of  30  minutes with this patient  and his son in a face to face visit on the date of this encounter reviewing the last office visit with me in January , assessing the  most recent medication changes, his   most recent imaging study ,  and post visit ordering of testing and therapeutics.    Follow-up: No follow-ups on file.   Crecencio Mc, MD

## 2021-10-18 NOTE — Assessment & Plan Note (Signed)
Increasing dose of  seroquel to 100 mg at 5 pm .   Adding a prn dose of Seroquel  50 mg at bedtime if needed.   continue lorazepam 0.5 mg bid   .

## 2021-10-18 NOTE — Assessment & Plan Note (Signed)
His wife can no longer care for him at home with the limited assistance provided by the [place] and the increased nocturnal agitation reported.  Medication changes made,  And FL-2 form signed

## 2021-10-18 NOTE — Assessment & Plan Note (Signed)
Secondary to sedentary lifestyle.  Continue to encourage the aide to walk him twice weekly

## 2021-10-18 NOTE — Patient Instructions (Addendum)
I have increased the seroquel to 90 tablets per  month .  I recommend increasing Corey Todd' dose to  2 tablets (100 mg)  at 5 pm.  And  you can give him another 50 mg at bedtime if needed   Continue the lorazepam at the current dose   FL-2 form has been signed

## 2021-10-20 ENCOUNTER — Other Ambulatory Visit: Payer: Self-pay | Admitting: Internal Medicine

## 2021-10-27 ENCOUNTER — Encounter: Payer: Self-pay | Admitting: *Deleted

## 2021-10-27 ENCOUNTER — Ambulatory Visit: Payer: Self-pay | Admitting: *Deleted

## 2021-10-27 NOTE — Patient Instructions (Signed)
Visit Information  Thank you for taking time to visit with me today. Please don't hesitate to contact me if I can be of assistance to you.   Following are the goals we discussed today:   Goals Addressed               This Visit's Progress     Assistance with placement (pt-stated)        Care Coordination Interventions: Confirmed that patient had a bed at [facility] on Monday, 10/23/21 but refused to stay became physically aggressive towards staff resulting in them requiring that he  be picked back up Confirmed that patient has not been physically violent towards spouse but often rebels in regards to hygiene practices Spouse confirms that patent continues to be active with [facility] and will need to call them to re-establish care Emotional Support Provided Discussed options for in home care including privately paying for additional in home care as patient's spouse is not happy with current provider(inconsistent care) Discussed contacting the [place] on the status of the new facility that will be opening up for [place] employed Solution-Focused Strategies employed:  Active listening / Reflection utilized  Emotional support provided        Our next appointment is by telephone on 11/03/21 at 10am  Please call the care guide team at [phone] if you need to cancel or reschedule your appointment.   If you are experiencing a Mental Health or [place] or need someone to talk to, please call the Suicide and Crisis Lifeline: 988   Patient verbalizes understanding of instructions and care plan provided today and agrees to view in [place]. Active MyChart status and patient understanding of how to access instructions and care plan via MyChart confirmed with patient.     Telephone follow up appointment with care management team member scheduled for: 11/03/21  Sheralyn Boatman [facility] Care Management [phone]

## 2021-10-27 NOTE — Patient Outreach (Addendum)
  Care Coordination   Initial Visit Note   10/27/2021 Name: ALMOND FITZGIBBON MRN: [id number] DOB: 28-Dec-1927  PEREZ DIRICO is a 88 y.o. year old male who sees [name], [name], MD for primary care. I spoke with patient's spouse by phone today  What matters to the patients health and wellness today?  Placement in a facility   Goals Addressed               This Visit's Progress     Assistance with placement (pt-stated)        Care Coordination Interventions: Confirmed that patient had a bed at [facility] on Monday, 10/23/21 but refused to stay became physically aggressive towards staff resulting in them requiring that he  be picked back up Confirmed that patient has not been physically violent towards spouse but often rebels in regards to hygiene practices Spouse confirms that patent continues to be active with [facility] and will need to call them to re-establish care Collaboration phone call to [place] [phone] admissions director with [facility] and [place] Ade Mar[place] [phone] ext [phone] social worker with  the [place] to discuss optoins VM left for a return call Emotional Support Provided Discussed options for in home care including privately paying for additional in home care as patient's spouse is not happy with current provider(inconsistent care) Discussed contacting the [place] on the status of the new facility that will be opening up for [place] employed Solution-Focused Strategies employed:  Active listening / Reflection utilized  Emotional support provided        SDOH assessments and interventions completed:   Yes   Care Coordination Interventions Activated:  Yes Care Coordination Interventions:   Yes, provided  Follow up plan: Follow up call scheduled for 11/03/21  Encounter Outcome:  Pt. Visit Completed

## 2021-10-27 NOTE — Progress Notes (Signed)
This encounter was created in error - please disregard.

## 2021-11-03 ENCOUNTER — Ambulatory Visit: Payer: Self-pay | Admitting: *Deleted

## 2021-11-03 ENCOUNTER — Encounter: Payer: Self-pay | Admitting: *Deleted

## 2021-11-03 NOTE — Patient Outreach (Signed)
  Care Coordination   Follow Up Visit Note   11/03/2021 Name: Corey Todd MRN: [id number] DOB: [birth year]  Corey Todd is a 88 y.o. year old male who sees [name], [name], MD for primary care. I spoke with spouse of Corey Todd by phone today  What matters to the patients health and wellness today?  Follow up on placement/in home care needs   Goals Addressed               This Visit's Progress     Assistance with placement (pt-stated)        Care Coordination Interventions: Follow up with patient's spouse regarding placement needs and plan of care until placements occurs Discussed no return call from Admissions Director with [facility] [place] [phone] or Roma Schanz [phone] ext [place] social worker, however did confirm that [facility] will plan to open in November-patient currently on waiting list and will potentially transition then Spouse confirms that patent continues to be active with [place], aid will resume care on Monday, 11/06/21-will continue with in home care until the [facility] opens in November Emotional Support Provided Motivational Interviewing employed Solution-Focused Strategies employed:  Active listening / Reflection utilized  Emotional support provided        SDOH assessments and interventions completed:   Yes   Care Coordination Interventions Activated:  Yes Care Coordination Interventions:  Yes, provided  Follow up plan: No further intervention required.  Encounter Outcome:  Pt. Visit Completed

## 2021-11-03 NOTE — Progress Notes (Signed)
This encounter was created in error - please disregard.

## 2021-11-03 NOTE — Patient Instructions (Signed)
Visit Information  Thank you for taking time to visit with me today. Please don't hesitate to contact me if I can be of assistance to you.   Following are the goals we discussed today:   Goals Addressed               This Visit's Progress     Assistance with placement (pt-stated)        Care Coordination Interventions: Follow up with patient's spouse regarding placement needs and plan of care until placements occurs Discussed no return call from Admissions Director with [facility] of [place] [phone] or Roma Schanz [phone] ext [place] social worker, however did confirm that [facility] will plan to open in November-patient currently on waiting list and will potentially transition then Spouse confirms that patent continues to be active with [place], aid will resume care on Monday, 11/06/21-will continue with in home care until the [facility] opens in November Emotional Support Provided Motivational Interviewing employed Solution-Focused Strategies employed:  Active listening / Reflection utilized  Emotional support provided         Please call the care guide team at [phone] if you need to cancel or reschedule your appointment.   If you are experiencing a Mental Health or [place] or need someone to talk to, please call the Suicide and Crisis Lifeline: 988   Patient verbalizes understanding of instructions and care plan provided today and agrees to view in [place]. Active MyChart status and patient understanding of how to access instructions and care plan via MyChart confirmed with patient.     No further follow up required: patient's spouse to contact her providers office with any additional community resource needs  Sheralyn Boatman [facility] Care Management [phone]

## 2021-11-22 ENCOUNTER — Encounter: Payer: Self-pay | Admitting: Emergency Medicine

## 2021-11-22 ENCOUNTER — Inpatient Hospital Stay
Admission: EM | Admit: 2021-11-22 | Discharge: 2021-12-14 | DRG: 641 | Disposition: A | Discharge status: Hospice | Payer: No Typology Code available for payment source | Attending: Internal Medicine | Admitting: Internal Medicine

## 2021-11-22 ENCOUNTER — Observation Stay: Payer: No Typology Code available for payment source

## 2021-11-22 ENCOUNTER — Ambulatory Visit: Payer: Self-pay | Admitting: *Deleted

## 2021-11-22 ENCOUNTER — Other Ambulatory Visit: Payer: Self-pay

## 2021-11-22 DIAGNOSIS — N400 Enlarged prostate without lower urinary tract symptoms: Secondary | ICD-10-CM | POA: Diagnosis present

## 2021-11-22 DIAGNOSIS — Z96642 Presence of left artificial hip joint: Secondary | ICD-10-CM | POA: Diagnosis present

## 2021-11-22 DIAGNOSIS — L89321 Pressure ulcer of left buttock, stage 1: Secondary | ICD-10-CM | POA: Diagnosis present

## 2021-11-22 DIAGNOSIS — Z66 Do not resuscitate: Secondary | ICD-10-CM | POA: Diagnosis present

## 2021-11-22 DIAGNOSIS — T82519A Breakdown (mechanical) of unspecified cardiac and vascular devices and implants, initial encounter: Secondary | ICD-10-CM | POA: Diagnosis not present

## 2021-11-22 DIAGNOSIS — I10 Essential (primary) hypertension: Secondary | ICD-10-CM | POA: Diagnosis not present

## 2021-11-22 DIAGNOSIS — Z751 Person awaiting admission to adequate facility elsewhere: Secondary | ICD-10-CM

## 2021-11-22 DIAGNOSIS — K5900 Constipation, unspecified: Secondary | ICD-10-CM | POA: Diagnosis present

## 2021-11-22 DIAGNOSIS — Z974 Presence of external hearing-aid: Secondary | ICD-10-CM

## 2021-11-22 DIAGNOSIS — F05 Delirium due to known physiological condition: Secondary | ICD-10-CM | POA: Diagnosis present

## 2021-11-22 DIAGNOSIS — Z79899 Other long term (current) drug therapy: Secondary | ICD-10-CM

## 2021-11-22 DIAGNOSIS — R5381 Other malaise: Secondary | ICD-10-CM | POA: Diagnosis not present

## 2021-11-22 DIAGNOSIS — N179 Acute kidney failure, unspecified: Secondary | ICD-10-CM | POA: Diagnosis present

## 2021-11-22 DIAGNOSIS — Z8261 Family history of arthritis: Secondary | ICD-10-CM

## 2021-11-22 DIAGNOSIS — E876 Hypokalemia: Secondary | ICD-10-CM | POA: Diagnosis present

## 2021-11-22 DIAGNOSIS — Z96661 Presence of right artificial ankle joint: Secondary | ICD-10-CM | POA: Diagnosis present

## 2021-11-22 DIAGNOSIS — L89311 Pressure ulcer of right buttock, stage 1: Secondary | ICD-10-CM | POA: Diagnosis present

## 2021-11-22 DIAGNOSIS — L8990 Pressure ulcer of unspecified site, unspecified stage: Secondary | ICD-10-CM | POA: Insufficient documentation

## 2021-11-22 DIAGNOSIS — R531 Weakness: Secondary | ICD-10-CM

## 2021-11-22 DIAGNOSIS — H9111 Presbycusis, right ear: Secondary | ICD-10-CM | POA: Diagnosis present

## 2021-11-22 DIAGNOSIS — F01518 Vascular dementia, unspecified severity, with other behavioral disturbance: Secondary | ICD-10-CM | POA: Diagnosis present

## 2021-11-22 DIAGNOSIS — E46 Unspecified protein-calorie malnutrition: Secondary | ICD-10-CM | POA: Diagnosis present

## 2021-11-22 DIAGNOSIS — E039 Hypothyroidism, unspecified: Secondary | ICD-10-CM | POA: Diagnosis present

## 2021-11-22 DIAGNOSIS — E870 Hyperosmolality and hypernatremia: Secondary | ICD-10-CM | POA: Diagnosis not present

## 2021-11-22 DIAGNOSIS — E440 Moderate protein-calorie malnutrition: Secondary | ICD-10-CM | POA: Diagnosis present

## 2021-11-22 DIAGNOSIS — Z79890 Hormone replacement therapy (postmenopausal): Secondary | ICD-10-CM

## 2021-11-22 DIAGNOSIS — Z87891 Personal history of nicotine dependence: Secondary | ICD-10-CM

## 2021-11-22 DIAGNOSIS — F0154 Vascular dementia, unspecified severity, with anxiety: Secondary | ICD-10-CM | POA: Diagnosis present

## 2021-11-22 DIAGNOSIS — Z6824 Body mass index (BMI) 24.0-24.9, adult: Secondary | ICD-10-CM

## 2021-11-22 DIAGNOSIS — E860 Dehydration: Secondary | ICD-10-CM | POA: Diagnosis present

## 2021-11-22 DIAGNOSIS — R627 Adult failure to thrive: Secondary | ICD-10-CM | POA: Diagnosis present

## 2021-11-22 DIAGNOSIS — Z743 Need for continuous supervision: Secondary | ICD-10-CM | POA: Diagnosis not present

## 2021-11-22 DIAGNOSIS — Z20822 Contact with and (suspected) exposure to covid-19: Secondary | ICD-10-CM | POA: Diagnosis present

## 2021-11-22 DIAGNOSIS — R404 Transient alteration of awareness: Secondary | ICD-10-CM | POA: Diagnosis not present

## 2021-11-22 HISTORY — DX: Unspecified dementia, unspecified severity, without behavioral disturbance, psychotic disturbance, mood disturbance, and anxiety: F03.90

## 2021-11-22 LAB — T4, FREE: Free T4: 0.78 ng/dL (ref 0.61–1.12)

## 2021-11-22 LAB — COMPREHENSIVE METABOLIC PANEL
ALT: 63 U/L — ABNORMAL HIGH (ref 0–44)
AST: 36 U/L (ref 15–41)
Albumin: 3.4 g/dL — ABNORMAL LOW (ref 3.5–5.0)
Alkaline Phosphatase: 105 U/L (ref 38–126)
Anion gap: 5 (ref 5–15)
BUN: 39 mg/dL — ABNORMAL HIGH (ref 8–23)
CO2: 24 mmol/L (ref 22–32)
Calcium: 8.6 mg/dL — ABNORMAL LOW (ref 8.9–10.3)
Chloride: 122 mmol/L — ABNORMAL HIGH (ref 98–111)
Creatinine, Ser: 1.61 mg/dL — ABNORMAL HIGH (ref 0.61–1.24)
GFR, Estimated: 40 mL/min — ABNORMAL LOW (ref >60)
Glucose, Bld: 93 mg/dL (ref 70–99)
Potassium: 3.3 mmol/L — ABNORMAL LOW (ref 3.5–5.1)
Sodium: 151 mmol/L — ABNORMAL HIGH (ref 135–145)
Total Bilirubin: 1.1 mg/dL (ref 0.3–1.2)
Total Protein: 8.1 g/dL (ref 6.5–8.1)

## 2021-11-22 LAB — CBC WITH DIFFERENTIAL/PLATELET
Abs Immature Granulocytes: 0.04 10*3/uL (ref 0.00–0.07)
Basophils Absolute: 0.1 10*3/uL (ref 0.0–0.1)
Basophils Relative: 1 %
Eosinophils Absolute: 0.1 10*3/uL (ref 0.0–0.5)
Eosinophils Relative: 1 %
HCT: 43.2 % (ref 39.0–52.0)
Hemoglobin: 13.1 g/dL (ref 13.0–17.0)
Immature Granulocytes: 0 %
Lymphocytes Relative: 31 %
Lymphs Abs: 3.2 10*3/uL (ref 0.7–4.0)
MCH: 27.6 pg (ref 26.0–34.0)
MCHC: 30.3 g/dL (ref 30.0–36.0)
MCV: 90.9 fL (ref 80.0–100.0)
Monocytes Absolute: 0.7 10*3/uL (ref 0.1–1.0)
Monocytes Relative: 7 %
Neutro Abs: 6.3 10*3/uL (ref 1.7–7.7)
Neutrophils Relative %: 60 %
Platelets: 304 10*3/uL (ref 150–400)
RBC: 4.75 MIL/uL (ref 4.22–5.81)
RDW: 14.6 % (ref 11.5–15.5)
WBC: 10.4 10*3/uL (ref 4.0–10.5)
nRBC: 0 % (ref 0.0–0.2)

## 2021-11-22 LAB — TROPONIN I (HIGH SENSITIVITY)
Troponin I (High Sensitivity): 23 ng/L — ABNORMAL HIGH (ref <18)
Troponin I (High Sensitivity): 23 ng/L — ABNORMAL HIGH (ref <18)

## 2021-11-22 LAB — TSH: TSH: 5.287 u[IU]/mL — ABNORMAL HIGH (ref 0.350–4.500)

## 2021-11-22 LAB — MAGNESIUM: Magnesium: 2.4 mg/dL (ref 1.7–2.4)

## 2021-11-22 LAB — LIPASE, BLOOD: Lipase: 36 U/L (ref 11–51)

## 2021-11-22 LAB — SARS CORONAVIRUS 2 BY RT PCR: SARS Coronavirus 2 by RT PCR: NEGATIVE

## 2021-11-22 LAB — PROCALCITONIN: Procalcitonin: <0.1 ng/mL

## 2021-11-22 MED ORDER — QUETIAPINE FUMARATE 25 MG PO TABS
100.0000 mg | ORAL_TABLET | Freq: Every day | ORAL | Status: DC
  Administered 2021-11-22 – 2021-12-12 (×21): 100 mg via ORAL
  Filled 2021-11-22 (×23): qty 4

## 2021-11-22 MED ORDER — ONDANSETRON HCL 4 MG PO TABS: 4.0000 mg | ORAL_TABLET | Freq: Four times a day (QID) | ORAL | Status: AC | PRN

## 2021-11-22 MED ORDER — ENOXAPARIN SODIUM 40 MG/0.4ML IJ SOSY
40.0000 mg | PREFILLED_SYRINGE | Freq: Every day | INTRAMUSCULAR | Status: DC
Start: 2021-11-22 — End: 2021-12-14
  Administered 2021-11-22 – 2021-12-11 (×19): 40 mg via SUBCUTANEOUS
  Filled 2021-11-22 (×21): qty 0.4

## 2021-11-22 MED ORDER — ONDANSETRON HCL 4 MG/2ML IJ SOLN: 4.0000 mg | Freq: Four times a day (QID) | INTRAMUSCULAR | Status: AC | PRN

## 2021-11-22 MED ORDER — POLYETHYLENE GLYCOL 3350 17 G PO PACK
17.0000 g | PACK | Freq: Two times a day (BID) | ORAL | Status: DC | PRN
  Administered 2021-11-23: 17 g via ORAL
  Filled 2021-11-22: qty 1

## 2021-11-22 MED ORDER — LEVOTHYROXINE SODIUM 50 MCG PO TABS
50.0000 ug | ORAL_TABLET | Freq: Every day | ORAL | Status: DC
  Administered 2021-11-23 – 2021-12-12 (×18): 50 ug via ORAL
  Filled 2021-11-22 (×19): qty 1

## 2021-11-22 MED ORDER — SENNOSIDES-DOCUSATE SODIUM 8.6-50 MG PO TABS
1.0000 | ORAL_TABLET | Freq: Every evening | ORAL | Status: DC | PRN
Start: 2021-11-22 — End: 2021-11-23

## 2021-11-22 MED ORDER — POTASSIUM CITRATE-CITRIC ACID 1100-334 MG/5ML PO SOLN
20.0000 meq | Freq: Once | ORAL | Status: AC
  Administered 2021-11-22: 20 meq via ORAL
  Filled 2021-11-22 (×2): qty 10

## 2021-11-22 MED ORDER — ORAL CARE MOUTH RINSE: 15.0000 mL | OROMUCOSAL | Status: DC | PRN

## 2021-11-22 MED ORDER — ACETAMINOPHEN 325 MG PO TABS: 650.0000 mg | ORAL_TABLET | Freq: Four times a day (QID) | ORAL | Status: AC | PRN

## 2021-11-22 MED ORDER — LORAZEPAM 0.5 MG PO TABS
0.5000 mg | ORAL_TABLET | Freq: Two times a day (BID) | ORAL | Status: DC | PRN
Start: 2021-11-22 — End: 2021-12-14
  Administered 2021-11-23 – 2021-11-25 (×3): 0.5 mg via ORAL
  Filled 2021-11-22 (×4): qty 1

## 2021-11-22 MED ORDER — ACETAMINOPHEN 650 MG RE SUPP: 650.0000 mg | Freq: Four times a day (QID) | RECTAL | Status: AC | PRN

## 2021-11-22 MED ORDER — QUETIAPINE FUMARATE 25 MG PO TABS: 50.0000 mg | ORAL_TABLET | Freq: Every evening | ORAL | Status: DC | PRN

## 2021-11-22 MED ORDER — QUETIAPINE FUMARATE 25 MG PO TABS
50.0000 mg | ORAL_TABLET | Freq: Every evening | ORAL | Status: DC | PRN
  Administered 2021-11-24 – 2021-11-30 (×3): 50 mg via ORAL
  Filled 2021-11-22 (×3): qty 2

## 2021-11-22 MED ORDER — DEXTROSE 5 % IV SOLN: INTRAVENOUS | Status: AC

## 2021-11-22 MED ORDER — LACTATED RINGERS IV BOLUS
500.0000 mL | Freq: Once | INTRAVENOUS | Status: AC
  Administered 2021-11-22: 500 mL via INTRAVENOUS

## 2021-11-22 NOTE — Assessment & Plan Note (Addendum)
Secondary to dehydration in setting of poor p.o. intake Resolved   Monitor periodic BMP

## 2021-11-22 NOTE — Assessment & Plan Note (Addendum)
Complicates overall prognosis. 

## 2021-11-22 NOTE — Assessment & Plan Note (Addendum)
   Versus sub-clinical  TSH on admission was 5.287 Free T4 was within normal limits  Per admitting physician's discussion with spouse over the phone, she did not know that her husband was prescribed thyroid medication and she has not given it to him in over a month, may explains why he has been more weak than baseline with poor p.o. intake in over a month, may explain constipation  resumed his home hypothyroid medication however his TSH and free T4 is appropriate for his age  Follow TSH outpatient.

## 2021-11-22 NOTE — Assessment & Plan Note (Addendum)
Resolved  Secondary to poor PO intake  IV fluids d/c  Encourage po

## 2021-11-22 NOTE — Hospital Course (Addendum)
[name] is a 88 year old male with vascular dementia with nighttime agitation, hypothyroid, constipation, anxiety, history of sundowning, who presents to the emergency department 11/22/2021 from home via EMS for chief concerns of poor p.o. intake for 1 month worse over past 2 days 08/09: sodium 151, creatinine 1.61, GFR 40, Free T4 / TSH was appropriate levels despite not on home medications per wife. COVID by PCR was negative. ED treatment: LR 500 mL bolus.  Admitted to hospitalist service for hyponatremia secondary to dehydration/poor p.o. intake.  Wife is requesting SNF placement, TOC consulted.  Synthroid restarted.  08/10: Sodium WNL at 141, potassium low at 3.0 and was repleted, creatinine improved to 1.31 with GFR 51.  Hgb dropped to 10.9 likely hemodilutional from IV fluids.  Placement pending, following potassium 08/11-08/16: SNF placement pending.  8/17-8/19: Palliative care consult, pending SNF placement.  8/20: Patient remained stable.  Awaiting SNF placement.  8/21: Still no VA approved bed available.  8/22: Still no VA bed available.  8/24: Still no VA bed availability.  Repeat BMP today with hypokalemia, magnesium 2.3.  Potassium is being repleted. Patient refusing most of the food except ice cream. Told nursing staff to try Ensure or boost.  8/25: Patient refusing most of the p.o. intake.  Potassium remains low despite replacement.  Sodium started to trend up at 147 today. Likely secondary to poor p.o. intake. Another message sent to palliative care to revisit. We will try giving him some IV. If continue to refuse he will be appropriate for hospice. Tried reaching wife with no success today.

## 2021-11-22 NOTE — ED Triage Notes (Signed)
Pt to ED [facility] from home for decreased PO intake and constipation x 2 days. EMS reports pt has hx/o dementia and combativeness but has been calm and cooperative with EMS. Pt Vital signs stable with EMS. Pt is in NAD.

## 2021-11-22 NOTE — Assessment & Plan Note (Addendum)
Resolved Status post LR 500 mL bolus  Recheck BMP tomorrow

## 2021-11-22 NOTE — Assessment & Plan Note (Addendum)
   Replete and recheck 

## 2021-11-22 NOTE — ED Provider Notes (Signed)
[facility] Provider Note    Event Date/Time   First MD Initiated Contact with Patient 11/22/21 1217     (approximate)   History   Decreased PO intake   HPI  Corey Todd is a 88 y.o. male with vascular dementia who comes in with concerns for not eating or drinking and increasing weakness.  According to family over the past 2 days he has not been eating and drinking as much and has had increased weakness generalized  Physical Exam   Triage Vital Signs: ED Triage Vitals [11/22/21 1221]  Enc Vitals Group     BP 133/69     Pulse Rate 70     Resp (!) 22     Temp 98.1 F (36.7 C)     Temp Source Oral     SpO2 99 %     Weight      Height      Head Circumference      Peak Flow      Pain Score      Pain Loc      Pain Edu?      Excl. in [place]?     Most recent vital signs: Vitals:   11/22/21 1221  BP: 133/69  Pulse: 70  Resp: (!) 22  Temp: 98.1 F (36.7 C)  SpO2: 99%     General: Awake, no distress.  CV:  Good peripheral perfusion.  Resp:  Normal effort.  Abd:  No distention. Soft and non tender Other:  Equal strength in arms and legs    ED Results / Procedures / Treatments   Labs (all labs ordered are listed, but only abnormal results are displayed) Labs Reviewed  COMPREHENSIVE METABOLIC PANEL - Abnormal; Notable for the following components:      Result Value   Sodium 151 (*)    Potassium 3.3 (*)    Chloride 122 (*)    BUN 39 (*)    Creatinine, Ser 1.61 (*)    Calcium 8.6 (*)    Albumin 3.4 (*)    ALT 63 (*)    GFR, Estimated 40 (*)    All other components within normal limits  TROPONIN I (HIGH SENSITIVITY) - Abnormal; Notable for the following components:   Troponin I (High Sensitivity) 23 (*)    All other components within normal limits  SARS CORONAVIRUS 2 BY RT PCR  CBC WITH DIFFERENTIAL/PLATELET  LIPASE, BLOOD  MAGNESIUM  T4, FREE  TSH  URINALYSIS, ROUTINE W REFLEX MICROSCOPIC     EKG  My interpretation  of EKG:  Appears to be normal sinus with a rate of 70 without any ST elevation or T wave inversions does have a lot of artifact due to a little bit of tremor.  He is getting a left anterior fascicular block  PROCEDURES:  Critical Care performed: No  .1-3 Lead EKG Interpretation  Performed by: Vanessa Alton, MD Authorized by: Vanessa Dauphin, MD     Interpretation: normal     ECG rate:  70   ECG rate assessment: normal     Rhythm: sinus rhythm     Ectopy: none     Conduction: normal      MEDICATIONS ORDERED IN ED: Medications - No data to display   IMPRESSION / MDM / [place] / ED COURSE  I reviewed the triage vital signs and the nursing notes.   Patient's presentation is most consistent with acute presentation with potential threat to  life or bodily function.   Differential includes Electra abnormalities, AKI, UTI, thyroid dysfunction  COVID-negative.  Labs show elevated sodium and chloride and the creatinine is elevated at 1.6.  I suspect this is more likely from dehydration.  Troponin slightly elevated but suspect more likely demand thyroid reassuring.  Family is now at bedside to discuss goals of care.  Patient is DNR and they deny any falls we discussed CT imaging of the head but of opted to hold off given they would declined surgical treatment.  Did this is reasonable given he seems to be at his baseline self from his dementia.  His abdomen is soft and nontender I do not feel like there is any evidence of acute abdominal pathology.  They are stating they are not able to take care of him at home due to his increasing weakness and agitation.  Do not feel like patient to be transferred to the [place] due to concern for agitation on EMS transport.  Family is willing to stay here at our hospital and will admit and I think they would benefit from palliative versus hospice consult patient and placement of patient.    The patient is on the cardiac monitor to evaluate for  evidence of arrhythmia and/or significant heart rate changes.      FINAL CLINICAL IMPRESSION(S) / ED DIAGNOSES   Final diagnoses:  Hypernatremia  AKI (acute kidney injury) ([place])     Rx / DC Orders   ED Discharge Orders     None        Note:  This document was prepared using Dragon voice recognition software and may include unintentional dictation errors.   Vanessa [place], MD 11/22/21 [phone]

## 2021-11-22 NOTE — Assessment & Plan Note (Addendum)
   Multifactorial  PT/OT recommends SNF.  TOC working on placement

## 2021-11-22 NOTE — ED Notes (Signed)
Pt placed on male purewick 

## 2021-11-22 NOTE — Patient Outreach (Signed)
  Care Coordination   Follow Up Visit Note   11/22/2021 Name: Corey Todd MRN: [id number] DOB: 1927/09/08  Corey Todd is a 88 y.o. year old male who sees [name], [name], MD for primary care. I spoke with the spouse of Corey Todd by phone today  What matters to the patients health and wellness today? Level of Care Needs    Goals Addressed               This Visit's Progress     Assistance with placement (pt-stated)        Care Coordination Interventions: Follow up with patient's spouse regarding placement needs and plan of care until placements occurs Discussed no return call from Admissions Director with [facility] [place] [phone] or Roma Schanz [phone] ext [place] social worker, however did confirm that [facility] will plan to open in November-patient currently on waiting list and will potentially transition then Spouse confirms that patent continues to be active with [place], however aid did not come in today, patient's behaviors becoming difficult to manage-patient's spouse verbalized that patient is refusing medication and hygiene practices-physically aggressive towards son-no physical aggression towards spouse CSW encouraged spouse to call 911 in the event that safety becomes a concern Emotional Support Provided Solution-Focused Strategies employed:  Active listening / Reflection utilized  Emotional support provided        SDOH assessments and interventions completed:  Yes     Care Coordination Interventions Activated:  Yes  Care Coordination Interventions:  Yes, provided   Follow up plan: Follow up call scheduled for 11/29/21    Encounter Outcome:  Pt. Visit Completed

## 2021-11-22 NOTE — Assessment & Plan Note (Addendum)
   Resumed home Seroquel 100 mg daily at 6 PM  Placement/memory care pending.

## 2021-11-22 NOTE — Assessment & Plan Note (Addendum)
   Resumed Seroquel 100 mg at 6 PM daily and ordered Seroquel 50 mg nightly as needed  Resumed home lorazepam 0.5 mg p.o. twice daily as needed for anxiety  Placement pending for SNF/memory care.

## 2021-11-22 NOTE — Patient Outreach (Addendum)
  Care Coordination   Follow Up Visit Note   11/22/2021 Name: Corey Todd MRN: [id number] DOB: Jul 04, 1927  Corey Todd is a 88 y.o. year old male who sees [name], [name], MD for primary care. I spoke with  spouse of Corey Todd by phone today  What matters to the patients health and wellness today?  Level of care concerns    Goals Addressed               This Visit's Progress     Assistance with placement (pt-stated)        Care Coordination Interventions: Follow up with patient's spouse regarding placement needs and plan of care until placements occurs Discussed no return call from Admissions Director with [facility] [place] [phone] or Roma Schanz [phone] ext [place] social worker, however did confirm that [facility] will plan to open in November-patient currently on waiting list and will potentially transition then Spouse confirms that patent continues to be active with [place], however aid did not come in today, patient's behaviors becoming difficult to manage-patient's spouse verbalized that patient is refusing medication and hygiene practices-physically aggressive towards son-no physical aggression towards spouse CSW encouraged spouse to call 911 in the event that safety becomes a concern Emotional Support Provided Solution-Focused Strategies employed:  Active listening / Reflection utilized  Emotional support provided Update: Return call from patient's spouse stating that patient has been hospitalized, she has informed the hospital that she is unable to provide care for patient in the home. Encouraged patient's spouse to contact the inpatient care manager to discuss discharge plan.        SDOH assessments and interventions completed:  Yes     Care Coordination Interventions Activated:  Yes  Care Coordination Interventions:  Yes, provided   Follow up plan: Follow up call scheduled for 11/29/21    Encounter  Outcome:  Pt. Visit Completed

## 2021-11-22 NOTE — Patient Instructions (Signed)
Visit Information  Thank you for taking time to visit with me today. Please don't hesitate to contact me if I can be of assistance to you.   Following are the goals we discussed today:   Goals Addressed               This Visit's Progress     Assistance with placement (pt-stated)        Care Coordination Interventions: Follow up with patient's spouse regarding placement needs and plan of care until placements occurs Discussed no return call from Admissions Director with [facility] [place] [phone] or Roma Schanz [phone] ext [place] social worker, however did confirm that [facility] will plan to open in November-patient currently on waiting list and will potentially transition then Spouse confirms that patent continues to be active with [place], however aid did not come in today, patient's behaviors becoming difficult to manage-patient's spouse verbalized that patient is refusing medication and hygiene practices-physically aggressive towards son-no physical aggression towards spouse CSW encouraged spouse to call 911 in the event that safety becomes a concern Emotional Support Provided Solution-Focused Strategies employed:  Active listening / Reflection utilized  Emotional support provided Update: Return call from patient's spouse stating that patient has been hospitalized, she has informed the hospital that she is unable to provide care for patient in the home. Encouraged patient's spouse to contact the inpatient care manager to discuss discharge plan.        Our next appointment is by telephone on 11/29/21 at 10am  Please call the care guide team at [phone] if you need to cancel or reschedule your appointment.   If you are experiencing a Mental Health or [place] or need someone to talk to, please call the Suicide and Crisis Lifeline: 988   Patient verbalizes understanding of instructions and care plan provided  today and agrees to view in [place]. Active MyChart status and patient understanding of how to access instructions and care plan via MyChart confirmed with patient.     Telephone follow up appointment with care management team member scheduled for: 11/29/21  Elliot Gurney, [place] Worker  [facility] Care Management [phone]

## 2021-11-22 NOTE — H&P (Addendum)
History and Physical   Corey Todd ZOX:[id number] DOB: 1927-09-14 DOA: 11/22/2021  PCP: Crecencio Mc, MD Patient coming from: Home via EMS  I have personally briefly reviewed patient's old medical records in [place].  Chief Concern: Poor p.o. intake  HPI: Corey Todd is a 88 year old male with vascular dementia with nighttime agitation, hypothyroid, constipation, anxiety, history of sundowning, who presents to the emergency department from home for chief concerns of poor p.o. intake for 2 days.  Initial vitals in the emergency department showed temperature of 98.1, respiration rate of 22, heart rate of 70, blood pressure 133/69, SpO2 99% on room air.  Serum sodium was 151, potassium 3.3, chloride of 122, bicarb 24, BUN of 39, serum creatinine of 1.61, GFR 40, nonfasting blood glucose 93, WBC 10.4, hemoglobin 13.1, platelets of 304.  High sensitive troponin was 23, lipase was within normal limits.  AST is 34, ALT 63.  Magnesium level was 2.4.  Free T4 was within normal limits.  TSH is in process.  COVID by PCR was negative.  ED treatment: LR 500 mL bolus.  At bedside, he says his name is Corey Todd. He says he is 88 years old. He was minimally verbal at bedside and he appeared comfortable.   I spoke with spouse over the phone, she states she is interested in [facility] for patient.  Per spouse, patient has not eaten well in about 1 month.  She states that she does feed him water and he does drink the water.  Spouse states that patient does not actively engage in asking for food and or feeding himself.  She also states that he does not complain about not feeling well.  Social history: Per spouse, patient is retired. He does not have a history of tobacco or etoh use.   ROS: Unable to complete due to advanced dementia.  ED Course: Discussed with emergency medicine provider, patient requiring hospitalization for chief concerns of hyponatremia and  dehydration.  Assessment/Plan  Principal Problem:   Hypernatremia Active Problems:   BPH (benign prostatic hyperplasia)   Vascular dementia with behavioral disturbance (HCC)   Constipation   Acquired hypothyroidism   Sundowning   Dehydration   Hypokalemia   AKI (acute kidney injury) ([place])   Protein calorie malnutrition (HCC)   Weakness   Assessment and Plan:  * Hypernatremia Dehydration - Secondary to dehydration in setting of poor p.o. intake - Free water and D5 IVF at 125 mL/h, 12 hours ordered - BMP in a.m.  Weakness With poor p.o. intake - Check procalcitonin and UA  Protein calorie malnutrition (HCC) - Query moderate to severe - Dietary has been consulted  AKI (acute kidney injury) ([place]) - Status post LR 500 mL bolus - We will continue with D5 free water at 25 mL/h, 12 hours ordered - BMP in a.m.  Hypokalemia - Presumed secondary to poor p.o. intake, malnutrition - Magnesium level was within normal limits - Polycitra 20 mill equivalent one-time dose ordered in the ED - Check BMP in the a.m.  Dehydration -- Secondary to poor PO intake  Sundowning - Resumed home Seroquel 100 mg daily at 6 PM - Per outpatient/PCP note, patient's Seroquel was increased to 100 mg daily at 5 PM on 10/18/2021  Acquired hypothyroidism - Versus sub-clinical - TSH on admission was 5.287 Free T4 was within normal limits - Per discussion with spouse over the phone, she did not know that her husband was prescribed thyroid medication and she  has not given it to him in over a month - This likely explains why he has been more weak than baseline with poor p.o. intake in over a month - I have resumed his home hypothyroid medication however his TSH and free T4 is appropriate for his age  Constipation - Senna docusate 1 tablet nightly as needed for mild constipation - GlycoLax twice daily as needed for moderate constipation ordered - Asymptomatic on evaluation and physical exam -  Abdominal x-ray ordered  Vascular dementia with behavioral disturbance (HCC) - Resumed Seroquel 100 mg at 6 PM daily and ordered Seroquel 50 mg nightly as needed - Resumed home lorazepam 0.5 mg p.o. twice daily as needed for anxiety  Chart reviewed.   DVT prophylaxis: Enoxaparin 40 mg subcu qhs Code Status: DNR/DNI, confirmed with spouse over the phone Diet: regular diet Family Communication: Updated spouse, Ms. Franne Grip Disposition Plan: pending clinical course Consults called: TOC consulted for snf/nursing facility Admission status: med-surg, observation  Past Medical History:  Diagnosis Date   Anxiety    Arthritis    BPH (benign prostatic hypertrophy)    Dementia ([place])    DVT (deep vein thrombosis) in pregnancy    Presbyacusis    right sided hearing aid   Past Surgical History:  Procedure Laterality Date   EYE SURGERY     cataract surgery   HERNIA REPAIR     INTRAMEDULLARY (IM) NAIL INTERTROCHANTERIC Right 11/14/2017   Procedure: INTRAMEDULLARY (IM) NAIL INTERTROCHANTRIC;  Surgeon: Leim Fabry, MD;  Location: [facility] ORS;  Service: Orthopedics;  Laterality: Right;   JOINT REPLACEMENT  1953   ankle crush injury, right   PROSTATE SURGERY     biopsy    TONSILLECTOMY AND ADENOIDECTOMY  1936   TOTAL HIP ARTHROPLASTY  2007   left , Dr Cay Schillings   Social History:  reports that he quit smoking about 67 years ago. His smoking use included cigarettes. He has been exposed to tobacco smoke. He has never used smokeless tobacco. He reports that he does not drink alcohol and does not use drugs.  No Known Allergies Family History  Problem Relation Age of Onset   Arthritis Mother    Kidney cancer Neg Hx    Kidney disease Neg Hx    Prostate cancer Neg Hx    Family history: Family history reviewed and not pertinent.   Prior to Admission medications   Medication Sig Start Date End Date Taking? Authorizing Provider  furosemide (LASIX) 20 MG tablet TAKE ONE  TABLET EVERY DAY AS  NEEDED FOR FLUID OR EDEMA 02/03/21   Crecencio Mc, MD  levothyroxine (SYNTHROID) 50 MCG tablet Take 1 tablet (50 mcg total) by mouth daily. 12/01/20   Crecencio Mc, MD  LORazepam (ATIVAN) 0.5 MG tablet Take 1 tablet (0.5 mg total) by mouth 2 (two) times daily. 10/11/21   Crecencio Mc, MD  QUEtiapine (SEROQUEL) 50 MG tablet Take 1 tablet (50 mg total) by mouth 3 (three) times daily. 10/18/21   Crecencio Mc, MD   Physical Exam: Vitals:   11/22/21 1221  BP: 133/69  Pulse: 70  Resp: (!) 22  Temp: 98.1 F (36.7 C)  TempSrc: Oral  SpO2: 99%   Constitutional: appears age appropriate, NAD, calm, comfortable Eyes: PERRL, lids and conjunctivae normal ENMT: Mucous membranes are moist. Posterior pharynx clear of any exudate or lesions. Age-appropriate dentition. Hearing appropriate Neck: normal, supple, no masses, no thyromegaly Respiratory: clear to auscultation bilaterally, no wheezing, no crackles. Normal respiratory  effort. No accessory muscle use.  Cardiovascular: Regular rate and rhythm, no murmurs / rubs / gallops. No extremity edema. 2+ pedal pulses. No carotid bruits.  Abdomen: no tenderness, no masses palpated, no hepatosplenomegaly. Bowel sounds positive.  Musculoskeletal: no clubbing / cyanosis. No joint deformity upper and lower extremities. Good ROM, no contractures, no atrophy. Normal muscle tone.  Skin: no rashes, lesions, ulcers. No induration Neurologic: Sensation intact. Strength 5/5 in all 4.  Psychiatric: Lacks judgment and insight. Alert and oriented x to self. Normal mood.   EKG: independently reviewed, showing sinus rhythm with rate of 70, QTc 474  Chest x-ray on Admission: I personally reviewed and I agree with radiologist reading as below.  DG Chest Port 1 View  Result Date: 11/22/2021 CLINICAL DATA:  Decreased p.o. intake EXAM: PORTABLE CHEST 1 VIEW COMPARISON:  Chest x-ray dated November 14, 2017 FINDINGS: Cardiac and mediastinal contours are within normal  limits. Elevation of the right hemidiaphragm. Calcified granulomas of the right lung, unchanged when compared to prior exam. Mild bibasilar opacities, likely due to atelectasis. No large pleural effusion or pneumothorax. Prominent gas-filled loops of bowel are seen under the bilateral hemidiaphragms. IMPRESSION: 1. Mild bibasilar opacities, likely due to atelectasis. 2. Prominent gas-filled loops of bowel are seen under the bilateral hemidiaphragms. Consider dedicated abdominal radiograph for further evaluation. Electronically Signed   By: Yetta Glassman M.D.   On: 11/22/2021 15:05    Labs on Admission: I have personally reviewed following labs  CBC: Recent Labs  Lab 11/22/21 1225  WBC 10.4  NEUTROABS 6.3  HGB 13.1  HCT 43.2  MCV 90.9  PLT 528   Basic Metabolic Panel: Recent Labs  Lab 11/22/21 1225  NA 151*  K 3.3*  CL 122*  CO2 24  GLUCOSE 93  BUN 39*  CREATININE 1.61*  CALCIUM 8.6*  MG 2.4   GFR: CrCl cannot be calculated (Unknown ideal weight.).  Liver Function Tests: Recent Labs  Lab 11/22/21 1225  AST 36  ALT 63*  ALKPHOS 105  BILITOT 1.1  PROT 8.1  ALBUMIN 3.4*   Recent Labs  Lab 11/22/21 1225  LIPASE 36   Thyroid Function Tests: Recent Labs    11/22/21 1225  TSH 5.287*  FREET4 0.78   Dr. Tobie Poet [facility]  If 7PM-7AM, please contact overnight-coverage provider If 7AM-7PM, please contact day coverage provider [email or web address]  11/22/2021, 3:57 PM

## 2021-11-22 NOTE — ED Notes (Signed)
Patient ate two choc chip cookies and had cup of milk

## 2021-11-22 NOTE — ED Notes (Signed)
Pt given lunch tray, placed at bedside. Pt eating cookie currently

## 2021-11-22 NOTE — Patient Instructions (Signed)
Visit Information  Thank you for taking time to visit with me today. Please don't hesitate to contact me if I can be of assistance to you.   Following are the goals we discussed today:   Goals Addressed               This Visit's Progress     Assistance with placement (pt-stated)        Care Coordination Interventions: Follow up with patient's spouse regarding placement needs and plan of care until placements occurs Discussed no return call from Admissions Director with [facility] [place] [phone] or Roma Schanz [phone] ext [place] social worker, however did confirm that [facility] will plan to open in November-patient currently on waiting list and will potentially transition then Spouse confirms that patent continues to be active with [place], however aid did not come in today, patient's behaviors becoming difficult to manage-patient's spouse verbalized that patient is refusing medication and hygiene practices-physically aggressive towards son-no physical aggression towards spouse CSW encouraged spouse to call 911 in the event that safety becomes a concern Emotional Support Provided Solution-Focused Strategies employed:  Active listening / Reflection utilized  Emotional support provided        Our next appointment is by telephone on 11/29/21 at 10am  Please call the care guide team at [phone] if you need to cancel or reschedule your appointment.   If you are experiencing a Mental Health or [facility] or need someone to talk to, please call the Suicide and Crisis Lifeline: 988   Patient verbalizes understanding of instructions and care plan provided today and agrees to view in [place]. Active MyChart status and patient understanding of how to access instructions and care plan via MyChart confirmed with patient.     Telephone follow up appointment with care management team member scheduled for: 11/29/21  Elliot Gurney, [place] Worker  [facility] [phone]

## 2021-11-22 NOTE — Assessment & Plan Note (Addendum)
Resolved - large BM 08/10  Asymptomatic on evaluation and physical exam  Abdominal x-ray concerning for ileus 08/10 --> adjusted bowel regimen to scheduled  Synthroid restarted, constipation may also be due to subclinical hypothyroid.

## 2021-11-23 DIAGNOSIS — F0154 Vascular dementia, unspecified severity, with anxiety: Secondary | ICD-10-CM | POA: Diagnosis present

## 2021-11-23 DIAGNOSIS — F05 Delirium due to known physiological condition: Secondary | ICD-10-CM | POA: Diagnosis present

## 2021-11-23 DIAGNOSIS — E860 Dehydration: Secondary | ICD-10-CM | POA: Diagnosis not present

## 2021-11-23 DIAGNOSIS — E440 Moderate protein-calorie malnutrition: Secondary | ICD-10-CM | POA: Diagnosis not present

## 2021-11-23 DIAGNOSIS — E876 Hypokalemia: Secondary | ICD-10-CM | POA: Diagnosis not present

## 2021-11-23 DIAGNOSIS — R627 Adult failure to thrive: Secondary | ICD-10-CM | POA: Diagnosis present

## 2021-11-23 DIAGNOSIS — L89311 Pressure ulcer of right buttock, stage 1: Secondary | ICD-10-CM | POA: Diagnosis present

## 2021-11-23 DIAGNOSIS — Z87891 Personal history of nicotine dependence: Secondary | ICD-10-CM | POA: Diagnosis not present

## 2021-11-23 DIAGNOSIS — L89321 Pressure ulcer of left buttock, stage 1: Secondary | ICD-10-CM | POA: Diagnosis present

## 2021-11-23 DIAGNOSIS — E039 Hypothyroidism, unspecified: Secondary | ICD-10-CM | POA: Diagnosis not present

## 2021-11-23 DIAGNOSIS — N400 Enlarged prostate without lower urinary tract symptoms: Secondary | ICD-10-CM | POA: Diagnosis present

## 2021-11-23 DIAGNOSIS — F01518 Vascular dementia, unspecified severity, with other behavioral disturbance: Secondary | ICD-10-CM | POA: Diagnosis not present

## 2021-11-23 DIAGNOSIS — Z6824 Body mass index (BMI) 24.0-24.9, adult: Secondary | ICD-10-CM | POA: Diagnosis not present

## 2021-11-23 DIAGNOSIS — Z66 Do not resuscitate: Secondary | ICD-10-CM | POA: Diagnosis not present

## 2021-11-23 DIAGNOSIS — R531 Weakness: Secondary | ICD-10-CM | POA: Diagnosis not present

## 2021-11-23 DIAGNOSIS — E870 Hyperosmolality and hypernatremia: Secondary | ICD-10-CM | POA: Diagnosis not present

## 2021-11-23 DIAGNOSIS — Z974 Presence of external hearing-aid: Secondary | ICD-10-CM | POA: Diagnosis not present

## 2021-11-23 DIAGNOSIS — Z8261 Family history of arthritis: Secondary | ICD-10-CM | POA: Diagnosis not present

## 2021-11-23 DIAGNOSIS — Z751 Person awaiting admission to adequate facility elsewhere: Secondary | ICD-10-CM | POA: Diagnosis not present

## 2021-11-23 DIAGNOSIS — K5900 Constipation, unspecified: Secondary | ICD-10-CM | POA: Diagnosis not present

## 2021-11-23 DIAGNOSIS — N179 Acute kidney failure, unspecified: Secondary | ICD-10-CM | POA: Diagnosis not present

## 2021-11-23 DIAGNOSIS — H9111 Presbycusis, right ear: Secondary | ICD-10-CM | POA: Diagnosis present

## 2021-11-23 DIAGNOSIS — Z515 Encounter for palliative care: Secondary | ICD-10-CM | POA: Diagnosis not present

## 2021-11-23 DIAGNOSIS — Z20822 Contact with and (suspected) exposure to covid-19: Secondary | ICD-10-CM | POA: Diagnosis present

## 2021-11-23 DIAGNOSIS — Z96642 Presence of left artificial hip joint: Secondary | ICD-10-CM | POA: Diagnosis present

## 2021-11-23 DIAGNOSIS — Z96661 Presence of right artificial ankle joint: Secondary | ICD-10-CM | POA: Diagnosis present

## 2021-11-23 LAB — URINALYSIS, COMPLETE (UACMP) WITH MICROSCOPIC
Bilirubin Urine: NEGATIVE
Glucose, UA: NEGATIVE mg/dL
Ketones, ur: NEGATIVE mg/dL
Leukocytes,Ua: NEGATIVE
Nitrite: NEGATIVE
Protein, ur: 30 mg/dL — AB
RBC / HPF: >50 RBC/hpf — ABNORMAL HIGH (ref 0–5)
Specific Gravity, Urine: 1.024 (ref 1.005–1.030)
Squamous Epithelial / HPF: NONE SEEN (ref 0–5)
pH: 5 (ref 5.0–8.0)

## 2021-11-23 LAB — CBC
HCT: 34.6 % — ABNORMAL LOW (ref 39.0–52.0)
Hemoglobin: 10.9 g/dL — ABNORMAL LOW (ref 13.0–17.0)
MCH: 28.2 pg (ref 26.0–34.0)
MCHC: 31.5 g/dL (ref 30.0–36.0)
MCV: 89.6 fL (ref 80.0–100.0)
Platelets: 207 10*3/uL (ref 150–400)
RBC: 3.86 MIL/uL — ABNORMAL LOW (ref 4.22–5.81)
RDW: 14.1 % (ref 11.5–15.5)
WBC: 8.8 10*3/uL (ref 4.0–10.5)
nRBC: 0 % (ref 0.0–0.2)

## 2021-11-23 LAB — BASIC METABOLIC PANEL
Anion gap: 4 — ABNORMAL LOW (ref 5–15)
BUN: 30 mg/dL — ABNORMAL HIGH (ref 8–23)
CO2: 23 mmol/L (ref 22–32)
Calcium: 7.5 mg/dL — ABNORMAL LOW (ref 8.9–10.3)
Chloride: 114 mmol/L — ABNORMAL HIGH (ref 98–111)
Creatinine, Ser: 1.31 mg/dL — ABNORMAL HIGH (ref 0.61–1.24)
GFR, Estimated: 51 mL/min — ABNORMAL LOW (ref >60)
Glucose, Bld: 100 mg/dL — ABNORMAL HIGH (ref 70–99)
Potassium: 3 mmol/L — ABNORMAL LOW (ref 3.5–5.1)
Sodium: 141 mmol/L (ref 135–145)

## 2021-11-23 MED ORDER — ADULT MULTIVITAMIN W/MINERALS CH
1.0000 | ORAL_TABLET | Freq: Every day | ORAL | Status: DC
  Administered 2021-11-25 – 2021-12-13 (×16): 1 via ORAL
  Filled 2021-11-23 (×21): qty 1

## 2021-11-23 MED ORDER — ASCORBIC ACID 500 MG PO TABS
500.0000 mg | ORAL_TABLET | Freq: Two times a day (BID) | ORAL | Status: DC
  Administered 2021-11-23 – 2021-12-13 (×28): 500 mg via ORAL
  Filled 2021-11-23 (×40): qty 1

## 2021-11-23 MED ORDER — POTASSIUM CHLORIDE CRYS ER 20 MEQ PO TBCR
40.0000 meq | EXTENDED_RELEASE_TABLET | Freq: Two times a day (BID) | ORAL | Status: AC
Start: 2021-11-23 — End: 2021-11-23
  Administered 2021-11-23 (×2): 40 meq via ORAL
  Filled 2021-11-23 (×2): qty 2

## 2021-11-23 MED ORDER — POLYETHYLENE GLYCOL 3350 17 G PO PACK
17.0000 g | PACK | Freq: Two times a day (BID) | ORAL | Status: DC
  Administered 2021-11-23: 17 g via ORAL
  Filled 2021-11-23 (×2): qty 1

## 2021-11-23 MED ORDER — SENNOSIDES-DOCUSATE SODIUM 8.6-50 MG PO TABS
2.0000 | ORAL_TABLET | Freq: Two times a day (BID) | ORAL | Status: DC
  Administered 2021-11-23 (×2): 2 via ORAL
  Filled 2021-11-23 (×3): qty 2

## 2021-11-23 MED ORDER — LIDOCAINE HCL URETHRAL/MUCOSAL 2 % EX GEL
1.0000 | Freq: Once | CUTANEOUS | Status: DC
Start: 2021-11-23 — End: 2021-12-14
  Filled 2021-11-23: qty 5

## 2021-11-23 MED ORDER — ENSURE ENLIVE PO LIQD
237.0000 mL | Freq: Three times a day (TID) | ORAL | Status: DC
  Administered 2021-11-23 – 2021-12-13 (×32): 237 mL via ORAL

## 2021-11-23 NOTE — Progress Notes (Signed)
PROGRESS NOTE    Corey Todd   OZH:[id number] DOB: [birth year]  DOA: 11/22/2021 Date of Service: 11/23/21 PCP: Crecencio Mc, MD     Brief Narrative / Hospital Course:  [name] is a 88 year old male with vascular dementia with nighttime agitation, hypothyroid, constipation, anxiety, history of sundowning, who presents to the emergency department 11/22/2021 from home via EMS for chief concerns of poor p.o. intake for 1 month worse over past 2 days 08/09: sodium 151, creatinine 1.61, GFR 40, Free T4 / TSH was appropriate levels despite not on home medications per wife. COVID by PCR was negative. ED treatment: LR 500 mL bolus.  Admitted to hospitalist service for hyponatremia secondary to dehydration/poor p.o. intake.  Wife is requesting SNF placement, TOC consulted.  Synthroid restarted.  08/10: Sodium WNL at 141, potassium low at 3.0 and was repleted, creatinine improved to 1.31 with GFR 51.  Hgb dropped to 10.9 likely hemodilutional from IV fluids.  Placement pending, following potassium   Consultants:  none  Procedures: none    Subjective: Unable to obtain subjective/ROS due to dementia.  Patient is pleasantly demented, trying to get out of bed, stating he needs to go to the kitchen     ASSESSMENT & PLAN:   Principal Problem:   Hypernatremia Active Problems:   BPH (benign prostatic hyperplasia)   Vascular dementia with behavioral disturbance (HCC)   Constipation   Acquired hypothyroidism   Sundowning   Dehydration   Hypokalemia   AKI (acute kidney injury) ([place])   Protein calorie malnutrition (HCC)   Weakness   Pressure injury of skin   Hypokalemia Presumed secondary to poor p.o. intake, malnutrition Magnesium level was within normal limits Polycitra 20 mill equivalent one-time dose ordered in the ED Low on a.m. BMP, repleted  AKI (acute kidney injury) (HCC) Status post LR 500 mL bolus D5 free water at 25 mL/h, 12 hours ordered BMP in a.m.  improved  Hypernatremia Secondary to dehydration in setting of poor p.o. intake Free water and D5 IVF at 125 mL/h, 12 hours ordered Resolved on BMP in a.m.  Protein calorie malnutrition (HCC) moderate to severe Dietary has been consulted  Constipation Asymptomatic on evaluation and physical exam Abdominal x-ray concerning for ileus --> adjusted bowel regimen to scheduled Synthroid restarted, constipation may also be due to subclinical hypothyroid  Dehydration Secondary to poor PO intake IV fluids  Acquired hypothyroidism Versus sub-clinical TSH on admission was 5.287 Free T4 was within normal limits Per admitting physician's discussion with spouse over the phone, she did not know that her husband was prescribed thyroid medication and she has not given it to him in over a month, may explains why he has been more weak than baseline with poor p.o. intake in over a month, may explain constipation resumed his home hypothyroid medication however his TSH and free T4 is appropriate for his age Follow TSH outpatient  Sundowning Resumed home Seroquel 100 mg daily at 6 PM Placement/memory care pending  Vascular dementia with behavioral disturbance (HCC) Resumed Seroquel 100 mg at 6 PM daily and ordered Seroquel 50 mg nightly as needed Resumed home lorazepam 0.5 mg p.o. twice daily as needed for anxiety Placement pending for SNF/memory care  Weakness With poor p.o. intake Check procalcitonin and UA --> no infection evident    DVT prophylaxis: lovenox Code Status: FULL Family Communication: TOC spoke w/ wife today Disposition Plan / TOC needs: SNF/memory care Barriers to discharge / significant pending items: Placement, pending recheck hypokalemia/creatinine  Objective: Vitals:   11/22/21 2023 11/22/21 2027 11/23/21 0303 11/23/21 0749  BP: (!) 95/43 (!) 109/57 121/65 100/61  Pulse: 62 60 (!) 58 (!) 54  Resp: '16  20 16  '$ Temp: 97.9 F (36.6 C)  97.9 F  (36.6 C) (!) 97.5 F (36.4 C)  TempSrc: Oral  Oral Oral  SpO2: 98%  90% 100%  Weight:      Height:        Intake/Output Summary (Last 24 hours) at 11/23/2021 1501 Last data filed at 11/23/2021 6226 Gross per 24 hour  Intake 1566.1 ml  Output 150 ml  Net 1416.1 ml   Filed Weights   11/22/21 1609  Weight: 75.3 kg    Examination:  Constitutional:  VS as above General Appearance: alert, NAD, pleasantly demented Eyes: Normal lids and conjunctive, non-icteric sclera Ears, Nose, Mouth, Throat: Normal external appearance Neck: No masses, trachea midline Respiratory: Normal respiratory effort No wheeze No rhonchi No rales Cardiovascular: S1/S2 normal No lower extremity edema Gastrointestinal: No tenderness Musculoskeletal:  No clubbing/cyanosis of digits Symmetrical movement in all extremities Neurological: No cranial nerve deficit on limited exam Alert Psychiatric: Poor judgment/insight Pleasant mood and affect       Scheduled Medications:   enoxaparin (LOVENOX) injection  40 mg Subcutaneous QHS   levothyroxine  50 mcg Oral Q0600   lidocaine  1 Application Urethral Once   polyethylene glycol  17 g Oral BID   potassium chloride  40 mEq Oral BID   QUEtiapine  100 mg Oral q1800   senna-docusate  2 tablet Oral BID    Continuous Infusions:   PRN Medications:  acetaminophen **OR** acetaminophen, LORazepam, ondansetron **OR** ondansetron (ZOFRAN) IV, mouth rinse, QUEtiapine  Antimicrobials:  Anti-infectives (From admission, onward)    None       Data Reviewed: I have personally reviewed following labs and imaging studies  CBC: Recent Labs  Lab 11/22/21 1225 11/23/21 0504  WBC 10.4 8.8  NEUTROABS 6.3  --   HGB 13.1 10.9*  HCT 43.2 34.6*  MCV 90.9 89.6  PLT 304 333   Basic Metabolic Panel: Recent Labs  Lab 11/22/21 1225 11/23/21 0504  NA 151* 141  K 3.3* 3.0*  CL 122* 114*  CO2 24 23  GLUCOSE 93 100*  BUN 39* 30*  CREATININE 1.61*  1.31*  CALCIUM 8.6* 7.5*  MG 2.4  --    GFR: Estimated Creatinine Clearance: 34.8 mL/min (A) (by C-G formula based on SCr of 1.31 mg/dL (H)). Liver Function Tests: Recent Labs  Lab 11/22/21 1225  AST 36  ALT 63*  ALKPHOS 105  BILITOT 1.1  PROT 8.1  ALBUMIN 3.4*   Recent Labs  Lab 11/22/21 1225  LIPASE 36   No results for input(s): "AMMONIA" in the last 168 hours. Coagulation Profile: No results for input(s): "INR", "PROTIME" in the last 168 hours. Cardiac Enzymes: No results for input(s): "CKTOTAL", "CKMB", "CKMBINDEX", "TROPONINI" in the last 168 hours. BNP (last 3 results) No results for input(s): "PROBNP" in the last 8760 hours. HbA1C: No results for input(s): "HGBA1C" in the last 72 hours. CBG: No results for input(s): "GLUCAP" in the last 168 hours. Lipid Profile: No results for input(s): "CHOL", "HDL", "LDLCALC", "TRIG", "CHOLHDL", "LDLDIRECT" in the last 72 hours. Thyroid Function Tests: Recent Labs    11/22/21 1225  TSH 5.287*  FREET4 0.78   Anemia Panel: No results for input(s): "VITAMINB12", "FOLATE", "FERRITIN", "TIBC", "IRON", "RETICCTPCT" in the last 72 hours. Urine analysis:    Component Value  Date/Time   COLORURINE AMBER (A) 11/22/2021 0740   APPEARANCEUR CLOUDY (A) 11/22/2021 0740   LABSPEC 1.024 11/22/2021 0740   PHURINE 5.0 11/22/2021 0740   GLUCOSEU NEGATIVE 11/22/2021 0740   HGBUR LARGE (A) 11/22/2021 0740   BILIRUBINUR NEGATIVE 11/22/2021 0740   KETONESUR NEGATIVE 11/22/2021 0740   PROTEINUR 30 (A) 11/22/2021 0740   NITRITE NEGATIVE 11/22/2021 0740   LEUKOCYTESUR NEGATIVE 11/22/2021 0740   Sepsis Labs: '@LABRCNTIP'$ (procalcitonin:4,lacticidven:4)  Recent Results (from the past 240 hour(s))  SARS Coronavirus 2 by RT PCR (hospital order, performed in  [place] hospital lab) *cepheid single result test* Anterior Nasal Swab     Status: None   Collection Time: 11/22/21 12:25 PM   Specimen: Anterior Nasal Swab  Result Value Ref Range  Status   SARS Coronavirus 2 by RT PCR NEGATIVE NEGATIVE Final    Comment: (NOTE) SARS-CoV-2 target nucleic acids are NOT DETECTED.  The SARS-CoV-2 RNA is generally detectable in upper and lower respiratory specimens during the acute phase of infection. The lowest concentration of SARS-CoV-2 viral copies this assay can detect is 250 copies / mL. A negative result does not preclude SARS-CoV-2 infection and should not be used as the sole basis for treatment or other patient management decisions.  A negative result may occur with improper specimen collection / handling, submission of specimen other than nasopharyngeal swab, presence of viral mutation(s) within the areas targeted by this assay, and inadequate number of viral copies (<250 copies / mL). A negative result must be combined with clinical observations, patient history, and epidemiological information.  Fact Sheet for Patients:   https://www.patel.info/  Fact Sheet for Healthcare Providers: https://hall.com/  This test is not yet approved or  cleared by the [place] FDA and has been authorized for detection and/or diagnosis of SARS-CoV-2 by FDA under an Emergency Use Authorization (EUA).  This EUA will remain in effect (meaning this test can be used) for the duration of the COVID-19 declaration under Section 564(b)(1) of the Act, 21 U.S.C. section 360bbb-3(b)(1), unless the authorization is terminated or revoked sooner.  Performed at [facility], [address] [place], [place] [address]          Radiology Studies: DG Abd 1 View  Result Date: 11/22/2021 CLINICAL DATA:  Constipation.  Poor p.o. intake EXAM: ABDOMEN - 1 VIEW COMPARISON:  10/08/2018 FINDINGS: Gaseous distension of large and small bowel within the abdomen including a prominent dilated loop of bowel in the right abdomen measuring 9 cm in diameter, likely representing the cecum. There is a moderate  volume of stool throughout the colon. No gross free intraperitoneal air. IMPRESSION: 1. Gaseous distension of large and small bowel with prominent dilated loop of bowel in the right abdomen, likely representing the cecum. Findings favor colonic ileus. 2. Moderate volume of stool within the colon. Electronically Signed   By: Davina Poke D.O.   On: 11/22/2021 16:51   DG Chest Port 1 View  Result Date: 11/22/2021 CLINICAL DATA:  Decreased p.o. intake EXAM: PORTABLE CHEST 1 VIEW COMPARISON:  Chest x-ray dated November 14, 2017 FINDINGS: Cardiac and mediastinal contours are within normal limits. Elevation of the right hemidiaphragm. Calcified granulomas of the right lung, unchanged when compared to prior exam. Mild bibasilar opacities, likely due to atelectasis. No large pleural effusion or pneumothorax. Prominent gas-filled loops of bowel are seen under the bilateral hemidiaphragms. IMPRESSION: 1. Mild bibasilar opacities, likely due to atelectasis. 2. Prominent gas-filled loops of bowel are seen under the bilateral hemidiaphragms. Consider  dedicated abdominal radiograph for further evaluation. Electronically Signed   By: Yetta Glassman M.D.   On: 11/22/2021 15:05            LOS: 0 days      Emeterio Reeve, DO [facility] 11/23/2021, 3:01 PM   Staff may message me via secure chat in [place]  but this may not receive immediate response,  please page for urgent matters!  If 7PM-7AM, please contact night-coverage [email or web address]  Dictation software was used to generate the above note. Typos may occur and escape review, as with typed/written notes. Please contact Dr Sheppard Coil directly for clarity if needed.

## 2021-11-23 NOTE — TOC Initial Note (Signed)
Transition of Care [facility]) - Initial/Assessment Note    Patient Details  Name: Corey Todd MRN: [id number] Date of Birth: 09/19/1927  Transition of Care [facility]) CM/SW Contact:    Beverly Sessions, RN Phone Number: 11/23/2021, 4:16 PM  Clinical Narrative:                    Admitted for: hyponatremia Admitted from: home with wife PCP: [name]  Current home health/prior home health/DME: [facility]  Per wife patient previously at [facility] in February.  Wife states that [facility] [place] was working towards getting him placed  Wife confirms she wants to move forward with placement  VM left for patients [facility] Roma Schanz [phone] ext [phone] [facility] social worker Email sent to [place] to confirm benefits, no response as of yet      Patient Goals and CMS Choice        Expected Discharge Plan and Services                                                Prior Living Arrangements/Services                       Activities of Daily Living Home Assistive Devices/Equipment: None ADL Screening (condition at time of admission) Patient's cognitive ability adequate to safely complete daily activities?: No Is the patient deaf or have difficulty hearing?: Yes Does the patient have difficulty seeing, even when wearing glasses/contacts?: No Does the patient have difficulty concentrating, remembering, or making decisions?: Yes Patient able to express need for assistance with ADLs?: Yes Does the patient have difficulty dressing or bathing?: Yes Independently performs ADLs?: No Communication: Independent Dressing (OT): Needs assistance Is this a change from baseline?: Pre-admission baseline Grooming: Needs assistance Is this a change from baseline?: Pre-admission baseline Feeding: Needs assistance Is this a change from baseline?: Pre-admission baseline Bathing: Needs assistance Is this a change from baseline?: Pre-admission baseline Toileting: Needs assistance Is  this a change from baseline?: Pre-admission baseline In/Out Bed: Needs assistance Is this a change from baseline?: Pre-admission baseline Walks in Home: Needs assistance Is this a change from baseline?: Pre-admission baseline Does the patient have difficulty walking or climbing stairs?: Yes Weakness of Legs: Both Weakness of Arms/Hands: Both  Permission Sought/Granted                  Emotional Assessment              Admission diagnosis:  Dehydration [E86.0] Hypernatremia [E87.0] AKI (acute kidney injury) ([place]) [N17.9] Patient Active Problem List   Diagnosis Date Noted   Malnutrition of moderate degree 11/23/2021   Dehydration 11/22/2021   Hypokalemia 11/22/2021   AKI (acute kidney injury) ([place]) 11/22/2021   Hypernatremia 11/22/2021   Protein calorie malnutrition ([place]) 11/22/2021   Weakness 11/22/2021   Pressure injury of skin 11/22/2021   Incontinence of feces 04/20/2021   Weight gain 04/20/2021   Sundowning 05/02/2020   Cerumen impaction 12/23/2019   Acquired hypothyroidism 12/23/2019   Constipation 09/29/2018   Vascular dementia with behavioral disturbance ([place]) 04/20/2018   Incontinence of urine 04/20/2018   Iron deficiency anemia 12/15/2017   Hospital discharge follow-up 12/15/2017   Bilateral lower extremity edema 11/25/2017   Closed right hip fracture, sequela 11/14/2017   Right leg swelling 10/17/2017   Proximal leg  weakness 10/17/2017   Herpes zoster without complication 43/83/7793   Encounter for preventive health examination 02/14/2017   Skin lesion of face 09/09/2015   Counseling regarding advanced directives and goals of care 12/26/2014   Do not resuscitate discussion 12/26/2014   Hypertension 08/07/2014   Encounter for Medicare annual wellness exam 11/18/2012   GERD (gastroesophageal reflux disease) 05/09/2012   Presbyacusis    BPH (benign prostatic hyperplasia)    PCP:  Crecencio Mc, MD Pharmacy:   [place],  [name] [address] [name] [address] Phone: [phone] Fax: [phone]     Social Determinants of Health (SDOH) Interventions    Readmission Risk Interventions     No data to display

## 2021-11-23 NOTE — Progress Notes (Signed)
Initial Nutrition Assessment  DOCUMENTATION CODES:   Non-severe (moderate) malnutrition in context of chronic illness  INTERVENTION:   Ensure Enlive po TID, each supplement provides 350 kcal and 20 grams of protein.  Magic cup TID with meals, each supplement provides 290 kcal and 9 grams of protein  MVI po daily   Vitamin C $RemoveB'500mg'NVIMprwM$  po BID  Pt at high refeed risk; recommend monitor potassium, magnesium and phosphorus labs daily until stable  Dysphagia 3 diet   NUTRITION DIAGNOSIS:   Moderate Malnutrition related to chronic illness (dementia) as evidenced by moderate fat depletion, severe fat depletion, moderate muscle depletion, severe muscle depletion.  GOAL:   Patient will meet greater than or equal to 90% of their needs  MONITOR:   PO intake, Supplement acceptance, Labs, Weight trends, Skin, I & O's  REASON FOR ASSESSMENT:   Consult Assessment of nutrition requirement/status  ASSESSMENT:   88 y/o male with h/o dementia, anxiety, hypothyroidism, BPH, HOH, chronic constipation, GERD, HTN, IDA, DVT and hernia repair who is admitted with FTT.  Met with pt in room today. Pt with advanced dementia and is only able to provide limited history. Pt reports that he loves to eat. Pt reports eating some carrots and beef for lunch today but reports that he does not like potatoes. Pt reports drinking some chocolate milk at breakfast. Pt is documented to be eating 25% of meals in hospital. RD will add supplements and vitamins to help pt meet his estimated needs and to support wound healing. RD will also change pt over to a mechanical soft diet. Pt is likely at refeed risk. Per chart, pt is down 8lbs(5%) over the past year and is down 5lbs(3%) over the past month; this is not significant. Pt's UBW appears to be ~195lbs; pt appears to be down ~30lbs(15%) over the past several years.   Medications reviewed and include: lovenox, synthroid, Kcl  Labs reviewed: K 3.0(L), BUN 30(H), creat  1.31(H) Hgb 10.9(L), Hct 34.6(L)  NUTRITION - FOCUSED PHYSICAL EXAM:  Flowsheet Row Most Recent Value  Orbital Region Mild depletion  Upper Arm Region Severe depletion  Thoracic and Lumbar Region Moderate depletion  Buccal Region Mild depletion  Temple Region Moderate depletion  Clavicle Bone Region Moderate depletion  Clavicle and Acromion Bone Region Moderate depletion  Scapular Bone Region Moderate depletion  Dorsal Hand Severe depletion  Patellar Region Moderate depletion  Anterior Thigh Region Moderate depletion  Posterior Calf Region Moderate depletion  Edema (RD Assessment) None  Hair Reviewed  Eyes Reviewed  Mouth Reviewed  Skin Reviewed  Nails Reviewed   Diet Order:   Diet Order             Diet regular Room service appropriate? Yes; Fluid consistency: Thin  Diet effective now                  EDUCATION NEEDS:   Not appropriate for education at this time  Skin:  Skin Assessment: Reviewed RN Assessment (Stage I left and right buttocks, skin tear arm)  Last BM:  pta  Height:   Ht Readings from Last 1 Encounters:  11/22/21 $RemoveB'5\' 8"'stUrZRGi$  (1.727 m)    Weight:   Wt Readings from Last 1 Encounters:  11/22/21 75.3 kg    Ideal Body Weight:  70 kg  BMI:  Body mass index is 25.26 kg/m.  Estimated Nutritional Needs:   Kcal:  1700-1900kcal/day  Protein:  85-95g/day  Fluid:  1.8-2.1L/day  Koleen Distance MS, RD, LDN Please refer to  [facility] for RD and/or RD on-call/weekend/after hours pager

## 2021-11-23 NOTE — Progress Notes (Signed)
At [facility], bladder scan performed as patient hasn't peed since arrival in unit, noted 317 mls, paged Neomia Glass, NP.  At 0245, in and out ordered.  At 0250, attempted in and out but unsuccessful, resistance noted, blood on catheter tip noted after removal. Updated Neomia Glass, NP, per the latter, will monitor for now and patient may need coude if still unable to urinate.

## 2021-11-23 NOTE — Progress Notes (Signed)
       CROSS COVER NOTE  NAME: Corey Todd MRN: [id number] DOB : 02-16-1928    Date of Service   11/23/21  HPI/Events of Note   Notified by nursing that Corey Corey Todd has not voided since 1530 yesterday. He is receiving 125 mL/hr IVF and bladder scan reading of 319m was limited by patient pulling at equipment.  Interventions   Plan: In and Out Cath- unsuccessful Coude- Order discontinued, Corey Todd      This document was prepared using Dragon voice recognition software and may include unintentional dictation errors.  KNeomia GlassDNP, MHA, FNP-BC Nurse Practitioner [facility] LancasterPager ([phone]

## 2021-11-23 NOTE — Progress Notes (Signed)
Mobility Specialist - Progress Note     11/23/21 1403  Mobility  Activity Ambulated independently in hallway  Level of Assistance Minimal assist, patient does 75% or more  Assistive Device Front wheel walker  Distance Ambulated (ft) 180 ft  Activity Response Tolerated well  $Mobility charge 1 Mobility   Pt in chair upon entry. Pt utilizing RA. Pt stood with min assist and VC. Pt ambulated one lap around NS. Pt left supine with alarm set and needs in reach. No complaints.   Candie Mile Mobility Specialist 11/23/21 2:05 PM

## 2021-11-23 NOTE — Evaluation (Signed)
Physical Therapy Evaluation Patient Details Name: Corey Todd MRN: [id number] DOB: Sep 05, 1927 Today's Date: 11/23/2021  History of Present Illness  [name] is a 88 year old male with vascular dementia with nighttime agitation, hypothyroid, constipation, anxiety, history of sundowning, who presents to the emergency department from home for chief concerns of poor p.o. intake for 2 days.  Clinical Impression  Pt received in bed agreeable to PT evaluation. Pt pleasant, cooperative, HOH and disoriented to place, time and situation. Pt follows one step commands and needs time ot process. Pt performed bed mobility with Usp and transfers with CGA of 1 using RW with Max VC for sequencing, safety and processing. Pt ambulated with RW in room with Min assist of 1 and Max VC for safety, hand placement and direction. Pt will benefit from SNF to improve strength, endurance, functional mobility in a familiar and consistent environment to reduce caregiver assistance/burden with functional mobility.       Recommendations for follow up therapy are one component of a multi-disciplinary discharge planning process, led by the attending physician.  Recommendations may be updated based on patient status, additional functional criteria and insurance authorization.  Follow Up Recommendations Home health PT      Assistance Recommended at Discharge Frequent or constant Supervision/Assistance  Patient can return home with the following  A little help with walking and/or transfers;A lot of help with bathing/dressing/bathroom;Assistance with cooking/housework;Assistance with feeding;Direct supervision/assist for medications management;Direct supervision/assist for financial management;Assist for transportation;Help with stairs or ramp for entrance    Equipment Recommendations Other (comment)  Recommendations for Other Services       Functional Status Assessment Patient has had a recent decline in their  functional status and demonstrates the ability to make significant improvements in function in a reasonable and predictable amount of time.     Precautions / Restrictions Precautions Precautions: Fall Restrictions Weight Bearing Restrictions: No      Mobility  Bed Mobility Overal bed mobility: Needs Assistance (with VC) Bed Mobility: Supine to Sit     Supine to sit: Supervision, HOB elevated          Transfers Overall transfer level: Needs assistance Equipment used: Rolling walker (2 wheels) Transfers: Sit to/from Stand, Bed to chair/wheelchair/BSC Sit to Stand: Min guard   Step pivot transfers: Min guard            Ambulation/Gait Ambulation/Gait assistance: Herbalist (Feet): 10 Feet Assistive device: Rolling walker (2 wheels) Gait Pattern/deviations: Decreased stride length, Shuffle, Decreased dorsiflexion - right, Decreased dorsiflexion - left Gait velocity: decreased        Stairs            Wheelchair Mobility    Modified Rankin (Stroke Patients Only)       Balance Overall balance assessment: Needs assistance Sitting-balance support: Feet supported, Bilateral upper extremity supported Sitting balance-Leahy Scale: Good     Standing balance support: Bilateral upper extremity supported Standing balance-Leahy Scale: Fair                               Pertinent Vitals/Pain Pain Assessment Pain Assessment: No/denies pain    Home Living Family/patient expects to be discharged to:: Skilled nursing facility Living Arrangements: Spouse/significant other                      Prior Function Prior Level of Function : Needs assist  Cognitive Assist : Mobility (cognitive);ADLs (cognitive)  Mobility (Cognitive): Step by step cues ADLs (Cognitive): Step by step cues Physical Assist : Mobility (physical) Mobility (physical): Transfers;Gait   Mobility Comments: Pt needed max asssit with ALD,s and IADls. Pt  unable ot process the task at hand seqencing of the task. Executive fucntion is impaired. ADLs Comments: needs assistance.     Hand Dominance        Extremity/Trunk Assessment   Upper Extremity Assessment Upper Extremity Assessment: Overall WFL for tasks assessed    Lower Extremity Assessment Lower Extremity Assessment: Generalized weakness (edema in R>L feet)    Cervical / Trunk Assessment Cervical / Trunk Assessment: Kyphotic  Communication   Communication: No difficulties (Pt talkative but not on topic or context.)  Cognition Arousal/Alertness: Awake/alert Behavior During Therapy: WFL for tasks assessed/performed Overall Cognitive Status: History of cognitive impairments - at baseline Area of Impairment: Orientation, Attention, Memory, Following commands, Safety/judgement, Problem solving, Awareness                 Orientation Level: Disoriented to, Place, Time, Situation Current Attention Level: Alternating   Following Commands: Follows one step commands inconsistently Safety/Judgement: Decreased awareness of safety   Problem Solving: Requires verbal cues, Difficulty sequencing          General Comments      Exercises     Assessment/Plan    PT Assessment Patient needs continued PT services  PT Problem List Decreased strength;Decreased activity tolerance;Decreased balance;Decreased mobility;Decreased cognition;Decreased safety awareness       PT Treatment Interventions Gait training;Functional mobility training;Therapeutic activities;Therapeutic exercise;Balance training;Neuromuscular re-education;Cognitive remediation;Patient/family education;Wheelchair mobility training    PT Goals (Current goals can be found in the Care Plan section)  Acute Rehab PT Goals PT Goal Formulation: Patient unable to participate in goal setting Time For Goal Achievement: 12/07/21 Potential to Achieve Goals: Good    Frequency Min 2X/week     Co-evaluation                AM-PAC PT "6 Clicks" Mobility  Outcome Measure Help needed turning from your back to your side while in a flat bed without using bedrails?: None Help needed moving from lying on your back to sitting on the side of a flat bed without using bedrails?: None Help needed moving to and from a bed to a chair (including a wheelchair)?: A Little Help needed standing up from a chair using your arms (e.g., wheelchair or bedside chair)?: A Little Help needed to walk in hospital room?: A Little Help needed climbing 3-5 steps with a railing? : Total 6 Click Score: 18    End of Session Equipment Utilized During Treatment: Gait belt Activity Tolerance: Patient tolerated treatment well Patient left: in chair;with call bell/phone within reach;with chair alarm set Nurse Communication: Mobility status PT Visit Diagnosis: Unsteadiness on feet (R26.81);Muscle weakness (generalized) (M62.81);Difficulty in walking, not elsewhere classified (R26.2)    Time: 7062-3762 PT Time Calculation (min) (ACUTE ONLY): 22 min   Charges:   PT Evaluation $PT Eval Low Complexity: 1 Low PT Treatments $Therapeutic Activity: 8-22 mins        Joaquin Music PT DPT 12:16 PM,11/23/21

## 2021-11-23 NOTE — Plan of Care (Signed)
  Problem: Clinical Measurements: Goal: Diagnostic test results will improve Outcome: Not Progressing   Problem: Activity: Goal: Risk for activity intolerance will decrease Outcome: Not Progressing   Problem: Nutrition: Goal: Adequate nutrition will be maintained Outcome: Not Progressing   Problem: Safety: Goal: Ability to remain free from injury will improve Outcome: Not Progressing

## 2021-11-24 DIAGNOSIS — E870 Hyperosmolality and hypernatremia: Secondary | ICD-10-CM | POA: Diagnosis not present

## 2021-11-24 LAB — MAGNESIUM: Magnesium: 2.1 mg/dL (ref 1.7–2.4)

## 2021-11-24 LAB — BASIC METABOLIC PANEL
Anion gap: 3 — ABNORMAL LOW (ref 5–15)
BUN: 24 mg/dL — ABNORMAL HIGH (ref 8–23)
CO2: 23 mmol/L (ref 22–32)
Calcium: 8.4 mg/dL — ABNORMAL LOW (ref 8.9–10.3)
Chloride: 116 mmol/L — ABNORMAL HIGH (ref 98–111)
Creatinine, Ser: 1.21 mg/dL (ref 0.61–1.24)
GFR, Estimated: 56 mL/min — ABNORMAL LOW (ref >60)
Glucose, Bld: 92 mg/dL (ref 70–99)
Potassium: 4 mmol/L (ref 3.5–5.1)
Sodium: 142 mmol/L (ref 135–145)

## 2021-11-24 MED ORDER — POLYETHYLENE GLYCOL 3350 17 G PO PACK: 17.0000 g | PACK | Freq: Every day | ORAL | Status: DC | PRN

## 2021-11-24 MED ORDER — SENNOSIDES-DOCUSATE SODIUM 8.6-50 MG PO TABS: 2.0000 | ORAL_TABLET | Freq: Every evening | ORAL | Status: DC | PRN

## 2021-11-24 NOTE — Progress Notes (Signed)
Physical Therapy Treatment Patient Details Name: Corey Todd MRN: [id number] DOB: [birth year] Today's Date: 11/24/2021   History of Present Illness [name] is a 88 year old male with vascular dementia with nighttime agitation, hypothyroid, constipation, anxiety, history of sundowning, who presents to the emergency department from home for chief concerns of poor p.o. intake for 2 days.    PT Comments    Pt seen this afternoon, pleasantly confused and very willing to get up out of bed. Pt required Supervision to transfer to edge of bed with HOB raised. Sit to stand with CGA, Gait training in room with RW and close CGA ~79f with repeated vc's to stay close to AD and direct RW to recliner. Pt continues to make functional progress and will benefit from SNF placement once medically cleared.    Recommendations for follow up therapy are one component of a multi-disciplinary discharge planning process, led by the attending physician.  Recommendations may be updated based on patient status, additional functional criteria and insurance authorization.  Follow Up Recommendations  Home health PT     Assistance Recommended at Discharge Frequent or constant Supervision/Assistance  Patient can return home with the following A little help with walking and/or transfers;A lot of help with bathing/dressing/bathroom;Assistance with cooking/housework;Assistance with feeding;Direct supervision/assist for medications management;Direct supervision/assist for financial management;Assist for transportation;Help with stairs or ramp for entrance   Equipment Recommendations       Recommendations for Other Services       Precautions / Restrictions Precautions Precautions: Fall Restrictions Weight Bearing Restrictions: No     Mobility  Bed Mobility Overal bed mobility: Needs Assistance Bed Mobility: Supine to Sit     Supine to sit: Supervision, HOB elevated          Transfers Overall  transfer level: Needs assistance Equipment used: Rolling walker (2 wheels) Transfers: Sit to/from Stand Sit to Stand: Min guard           General transfer comment: Repeated verbal, tactile, and visual, cues    Ambulation/Gait Ambulation/Gait assistance: Min assist Gait Distance (Feet): 18 Feet Assistive device: Rolling walker (2 wheels) Gait Pattern/deviations: Decreased stride length, Shuffle, Decreased dorsiflexion - right, Decreased dorsiflexion - left           Stairs             Wheelchair Mobility    Modified Rankin (Stroke Patients Only)       Balance Overall balance assessment: Needs assistance Sitting-balance support: Feet supported, Bilateral upper extremity supported Sitting balance-Leahy Scale: Good     Standing balance support: Bilateral upper extremity supported Standing balance-Leahy Scale: Fair                              Cognition Arousal/Alertness: Awake/alert Behavior During Therapy: WFL for tasks assessed/performed Overall Cognitive Status: History of cognitive impairments - at baseline Area of Impairment: Orientation, Attention, Memory, Following commands, Safety/judgement, Problem solving, Awareness                 Orientation Level: Disoriented to, Place, Time, Situation Current Attention Level: Alternating           General Comments: Pt pleasantly confused        Exercises      General Comments        Pertinent Vitals/Pain Pain Assessment Pain Assessment: No/denies pain    Home Living  Prior Function            PT Goals (current goals can now be found in the care plan section) Progress towards PT goals: Progressing toward goals    Frequency    Min 2X/week      PT Plan Current plan remains appropriate    Co-evaluation              AM-PAC PT "6 Clicks" Mobility   Outcome Measure  Help needed turning from your back to your side while in  a flat bed without using bedrails?: None Help needed moving from lying on your back to sitting on the side of a flat bed without using bedrails?: None Help needed moving to and from a bed to a chair (including a wheelchair)?: A Little Help needed standing up from a chair using your arms (e.g., wheelchair or bedside chair)?: A Little Help needed to walk in hospital room?: A Little Help needed climbing 3-5 steps with a railing? : Total 6 Click Score: 18    End of Session Equipment Utilized During Treatment: Gait belt Activity Tolerance: Patient tolerated treatment well Patient left: in chair;with call bell/phone within reach;with chair alarm set Nurse Communication: Mobility status PT Visit Diagnosis: Unsteadiness on feet (R26.81);Muscle weakness (generalized) (M62.81);Difficulty in walking, not elsewhere classified (R26.2)     Time: 9937-1696 PT Time Calculation (min) (ACUTE ONLY): 16 min  Charges:  $Therapeutic Activity: 8-22 mins                    Mikel Cella, PTA    [name] 11/24/2021, 4:07 PM

## 2021-11-24 NOTE — TOC Progression Note (Signed)
Transition of Care [facility]) - Progression Note    Patient Details  Name: Corey Todd MRN: [id number] Date of Birth: January 30, 1928  Transition of Care [facility]) CM/SW Contact  Beverly Sessions, RN Phone Number: 11/24/2021, 3:18 PM  Clinical Narrative:     Confirmed with Roma Schanz [phone] VA social worker that patient is 100% service connected [place] completed and faxed to  [place]: [phone]   Per [facility] approval will have to be received through the [place].  Once Approved TOC will need to reach out to facilities below to check for availability.  Wife's preference would be [facility])                                        *updated 10/06/2021   [place] [facility]   [address]   [place], [place] [address] [place], [place] [address]   Phone:  [phone] Phone: [phone]   Fax:  [phone] Fax: [phone]   Admissions: Rogelia Rohrer  [email or web address]) Admissions: Maryan Rued  [email or web address])                   *SMOKE FREE FACILITY *SMOKE FREE FACILITY   Specialty Care: Dialysis, Peg tube, NG tube, 300 lb. weight limit Specialty Care: Dialysis, Peg tube, NG tube,   350 lb. weight limit         [place]    [address] [place]   [place], [place] [address] [place], [place] [address]   Phone: [phone] Phone: [phone]    Fax: [phone] Fax: [phone]   Admissions:  Maximiano Coss- [facility]    [email or web address]) Admissions: Manus Rudd ([email or web address])   *SMOKE FREE FACILITY *SMOKE FREE FACILITY    Specialty Care: Dialysis, Peg Tube, NG Tube, TPN, 300 lb. weight limit Specialty Care: Dialysis, Peg Tube, Established Trach, 350- 400 lb. weight limit         [place] [facility]     [facility]          **ADMISSIONS HOLD EFFECTIVE 11/02/21**   [facility]   [address]   [place], [place] [address] [place],  [address]   Phone: [phone] Phone: [phone]   Fax: [phone] Fax: [phone]   Admissions:  Rontelia Coulter ([email or web address]) Admissions: Rise Mu ([email or web address])                     Fax: [phone]       Smoking area outside *[place]: Dialysis, Peg tube/ NG tube, 325 lb. weight limit.  Specialty Care: Dialysis, Trach, Peg Tube, 350 lb. weight limit         [facility] [place]   [place], [place] [address] [place], [place] [address]   Phone: [phone] Phone: [phone]   Fax: [phone] Fax: [phone]   Admissions: Dione Booze ([email or web address])                           P: [phone]          Admissions: Angela Burke ([email or web address])   *SMOKE FREE FACILITY Smoking area outside   Specialty Care: Dialysis, Trach, Peg Tube, 400 lb. weight limit Speciality Care: Dialysis, Peg tube/ NG tube, 400 lb. weight limit.          [facility]                [address]     [place],  [address]     Phone: [phone]     Fax: [phone]     Admissions:  STR, short stay and Respite- Jerilynn Som  ([email or web address]) or LTC-  Dorene Sorrow ([email or web address]) [phone]     *SMOKE FREE FACILITY     Specialty Care: Secured/ memory care unit,  dialysis, Trach, Peg tube, TPN, 500 lb. weight limit                   Expected Discharge Plan and Services                                                 Social Determinants of Health (SDOH) Interventions    Readmission Risk Interventions     No data to display

## 2021-11-24 NOTE — Progress Notes (Signed)
PROGRESS NOTE    Corey Todd   QVZ:[id number] DOB: 06-27-1927  DOA: 11/22/2021 Date of Service: 11/24/21 PCP: Corey Mc, MD     Brief Narrative / Hospital Course:  [name] is a 88 year old male with vascular dementia with nighttime agitation, hypothyroid, constipation, anxiety, history of sundowning, who presents to the emergency department 11/22/2021 from home via EMS for chief concerns of poor p.o. intake for 1 month worse over past 2 days 08/09: sodium 151, creatinine 1.61, GFR 40, Free T4 / TSH was appropriate levels despite not on home medications per wife. COVID by PCR was negative. ED treatment: LR 500 mL bolus.  Admitted to hospitalist service for hyponatremia secondary to dehydration/poor p.o. intake.  Wife is requesting SNF placement, TOC consulted.  Synthroid restarted.  08/10: Sodium WNL at 141, potassium low at 3.0 and was repleted, creatinine improved to 1.31 with GFR 51.  Hgb dropped to 10.9 likely hemodilutional from IV fluids.  Placement pending, following potassium 08/11: Sodium and potassium WNL this morning.  SNF placement pending.   Consultants:  none  Procedures: none    Subjective: Unable to obtain subjective/ROS due to dementia. Pt is resting comfortably no concerns from RN      ASSESSMENT & PLAN:   Principal Problem:   Hypernatremia Active Problems:   BPH (benign prostatic hyperplasia)   Vascular dementia with behavioral disturbance (HCC)   Constipation   Acquired hypothyroidism   Sundowning   Dehydration   Hypokalemia   AKI (acute kidney injury) ([place])   Protein calorie malnutrition (HCC)   Weakness   Pressure injury of skin   Malnutrition of moderate degree   Hypokalemia repleted and resolved Presumed secondary to poor p.o. intake, malnutrition Magnesium level was within normal limits  AKI (acute kidney injury) ([place]) Resolved Status post LR 500 mL bolus Recheck BMP in a few days  Hypernatremia Secondary to  dehydration in setting of poor p.o. intake Resolved   Protein calorie malnutrition (HCC) moderate to severe Dietary has been consulted  Constipation Resolved - large BM 08/10 Asymptomatic on evaluation and physical exam Abdominal x-ray concerning for ileus 08/10 --> adjusted bowel regimen to scheduled Synthroid restarted, constipation may also be due to subclinical hypothyroid  Dehydration Resolved  Secondary to poor PO intake IV fluids d/c  Acquired hypothyroidism Versus sub-clinical TSH on admission was 5.287 Free T4 was within normal limits Per admitting physician's discussion with spouse over the phone, she did not know that her husband was prescribed thyroid medication and she has not given it to him in over a month, may explains why he has been more weak than baseline with poor p.o. intake in over a month, may explain constipation resumed his home hypothyroid medication however his TSH and free T4 is appropriate for his age Follow TSH outpatient  Sundowning Resumed home Seroquel 100 mg daily at 6 PM Placement/memory care pending  Vascular dementia with behavioral disturbance (HCC) Resumed Seroquel 100 mg at 6 PM daily and ordered Seroquel 50 mg nightly as needed Resumed home lorazepam 0.5 mg p.o. twice daily as needed for anxiety Placement pending for SNF/memory care  Weakness With poor p.o. intake no infection evident PT/OT as able given dementia Diet as tolerated Placement pending     DVT prophylaxis: lovenox Code Status: FULL Family Communication: TOC spoke w/ wife today Disposition Plan / TOC needs: SNF/memory care Barriers to discharge / significant pending items: Placement             Objective:  Vitals:   11/23/21 1955 11/24/21 0448 11/24/21 0946 11/24/21 1126  BP: 125/68 122/76 112/70 (!) 131/50  Pulse: 80 76 82 80  Resp: '16 16 17 17  '$ Temp: 97.8 F (36.6 C) 98.2 F (36.8 C) 97.7 F (36.5 C) 97.6 F (36.4 C)  TempSrc: Oral Axillary     SpO2: 97% 100% 95% 99%  Weight:      Height:        Intake/Output Summary (Last 24 hours) at 11/24/2021 1456 Last data filed at 11/24/2021 0900 Gross per 24 hour  Intake 118 ml  Output 1700 ml  Net -1582 ml   Filed Weights   11/22/21 1609  Weight: 75.3 kg    Examination:  Constitutional:  VS as above General Appearance: alert, NAD, pleasantly demented Eyes: Normal lids and conjunctive, non-icteric sclera Ears, Nose, Mouth, Throat: Normal external appearance Neck: No masses, trachea midline Respiratory: Normal respiratory effort No wheeze No rhonchi No rales Cardiovascular: S1/S2 normal No lower extremity edema Gastrointestinal: No tenderness Musculoskeletal:  No clubbing/cyanosis of digits Symmetrical movement in all extremities Neurological: No cranial nerve deficit on limited exam Alert Psychiatric: Poor judgment/insight Pleasant mood and affect       Scheduled Medications:   vitamin C  500 mg Oral BID   enoxaparin (LOVENOX) injection  40 mg Subcutaneous QHS   feeding supplement  237 mL Oral TID BM   levothyroxine  50 mcg Oral Q0600   lidocaine  1 Application Urethral Once   multivitamin with minerals  1 tablet Oral Daily   QUEtiapine  100 mg Oral q1800    Continuous Infusions:   PRN Medications:  acetaminophen **OR** acetaminophen, LORazepam, ondansetron **OR** ondansetron (ZOFRAN) IV, mouth rinse, polyethylene glycol, QUEtiapine, senna-docusate  Antimicrobials:  Anti-infectives (From admission, onward)    None       Data Reviewed: I have personally reviewed following labs and imaging studies  CBC: Recent Labs  Lab 11/22/21 1225 11/23/21 0504  WBC 10.4 8.8  NEUTROABS 6.3  --   HGB 13.1 10.9*  HCT 43.2 34.6*  MCV 90.9 89.6  PLT 304 073   Basic Metabolic Panel: Recent Labs  Lab 11/22/21 1225 11/23/21 0504 11/24/21 0442  NA 151* 141 142  K 3.3* 3.0* 4.0  CL 122* 114* 116*  CO2 '24 23 23  '$ GLUCOSE 93 100* 92  BUN 39*  30* 24*  CREATININE 1.61* 1.31* 1.21  CALCIUM 8.6* 7.5* 8.4*  MG 2.4  --  2.1   GFR: Estimated Creatinine Clearance: 37.7 mL/min (by C-G formula based on SCr of 1.21 mg/dL). Liver Function Tests: Recent Labs  Lab 11/22/21 1225  AST 36  ALT 63*  ALKPHOS 105  BILITOT 1.1  PROT 8.1  ALBUMIN 3.4*   Recent Labs  Lab 11/22/21 1225  LIPASE 36   No results for input(s): "AMMONIA" in the last 168 hours. Coagulation Profile: No results for input(s): "INR", "PROTIME" in the last 168 hours. Cardiac Enzymes: No results for input(s): "CKTOTAL", "CKMB", "CKMBINDEX", "TROPONINI" in the last 168 hours. BNP (last 3 results) No results for input(s): "PROBNP" in the last 8760 hours. HbA1C: No results for input(s): "HGBA1C" in the last 72 hours. CBG: No results for input(s): "GLUCAP" in the last 168 hours. Lipid Profile: No results for input(s): "CHOL", "HDL", "LDLCALC", "TRIG", "CHOLHDL", "LDLDIRECT" in the last 72 hours. Thyroid Function Tests: Recent Labs    11/22/21 1225  TSH 5.287*  FREET4 0.78   Anemia Panel: No results for input(s): "VITAMINB12", "FOLATE", "FERRITIN", "TIBC", "IRON", "  RETICCTPCT" in the last 72 hours. Urine analysis:    Component Value Date/Time   COLORURINE AMBER (A) 11/22/2021 0740   APPEARANCEUR CLOUDY (A) 11/22/2021 0740   LABSPEC 1.024 11/22/2021 0740   PHURINE 5.0 11/22/2021 0740   GLUCOSEU NEGATIVE 11/22/2021 0740   HGBUR LARGE (A) 11/22/2021 0740   BILIRUBINUR NEGATIVE 11/22/2021 0740   KETONESUR NEGATIVE 11/22/2021 0740   PROTEINUR 30 (A) 11/22/2021 0740   NITRITE NEGATIVE 11/22/2021 0740   LEUKOCYTESUR NEGATIVE 11/22/2021 0740   Sepsis Labs: '@LABRCNTIP'$ (procalcitonin:4,lacticidven:4)  Recent Results (from the past 240 hour(s))  SARS Coronavirus 2 by RT PCR (hospital order, performed in [place] hospital lab) *cepheid single result test* Anterior Nasal Swab     Status: None   Collection Time: 11/22/21 12:25 PM   Specimen: Anterior Nasal  Swab  Result Value Ref Range Status   SARS Coronavirus 2 by RT PCR NEGATIVE NEGATIVE Final    Comment: (NOTE) SARS-CoV-2 target nucleic acids are NOT DETECTED.  The SARS-CoV-2 RNA is generally detectable in upper and lower respiratory specimens during the acute phase of infection. The lowest concentration of SARS-CoV-2 viral copies this assay can detect is 250 copies / mL. A negative result does not preclude SARS-CoV-2 infection and should not be used as the sole basis for treatment or other patient management decisions.  A negative result may occur with improper specimen collection / handling, submission of specimen other than nasopharyngeal swab, presence of viral mutation(s) within the areas targeted by this assay, and inadequate number of viral copies (<250 copies / mL). A negative result must be combined with clinical observations, patient history, and epidemiological information.  Fact Sheet for Patients:   https://www.patel.info/  Fact Sheet for Healthcare Providers: https://hall.com/  This test is not yet approved or  cleared by the [place] FDA and has been authorized for detection and/or diagnosis of SARS-CoV-2 by FDA under an Emergency Use Authorization (EUA).  This EUA will remain in effect (meaning this test can be used) for the duration of the COVID-19 declaration under Section 564(b)(1) of the Act, 21 U.S.C. section 360bbb-3(b)(1), unless the authorization is terminated or revoked sooner.  Performed at [facility], [address] [place], [place] [address]          Radiology Studies: DG Abd 1 View  Result Date: 11/22/2021 CLINICAL DATA:  Constipation.  Poor p.o. intake EXAM: ABDOMEN - 1 VIEW COMPARISON:  10/08/2018 FINDINGS: Gaseous distension of large and small bowel within the abdomen including a prominent dilated loop of bowel in the right abdomen measuring 9 cm in diameter, likely representing the  cecum. There is a moderate volume of stool throughout the colon. No gross free intraperitoneal air. IMPRESSION: 1. Gaseous distension of large and small bowel with prominent dilated loop of bowel in the right abdomen, likely representing the cecum. Findings favor colonic ileus. 2. Moderate volume of stool within the colon. Electronically Signed   By: Davina Poke D.O.   On: 11/22/2021 16:51   DG Chest Port 1 View  Result Date: 11/22/2021 CLINICAL DATA:  Decreased p.o. intake EXAM: PORTABLE CHEST 1 VIEW COMPARISON:  Chest x-ray dated November 14, 2017 FINDINGS: Cardiac and mediastinal contours are within normal limits. Elevation of the right hemidiaphragm. Calcified granulomas of the right lung, unchanged when compared to prior exam. Mild bibasilar opacities, likely due to atelectasis. No large pleural effusion or pneumothorax. Prominent gas-filled loops of bowel are seen under the bilateral hemidiaphragms. IMPRESSION: 1. Mild bibasilar opacities, likely due to atelectasis.  2. Prominent gas-filled loops of bowel are seen under the bilateral hemidiaphragms. Consider dedicated abdominal radiograph for further evaluation. Electronically Signed   By: Yetta Glassman M.D.   On: 11/22/2021 15:05            LOS: 1 day      Emeterio Reeve, DO [facility] 11/24/2021, 2:56 PM   Staff may message me via secure chat in [place]  but this may not receive immediate response,  please page for urgent matters!  If 7PM-7AM, please contact night-coverage [email or web address]  Dictation software was used to generate the above note. Typos may occur and escape review, as with typed/written notes. Please contact Dr Sheppard Coil directly for clarity if needed.

## 2021-11-24 NOTE — Plan of Care (Signed)
  Problem: Activity: Goal: Risk for activity intolerance will decrease Outcome: Progressing   Problem: Pain Managment: Goal: General experience of comfort will improve Outcome: Progressing   Problem: Safety: Goal: Ability to remain free from injury will improve Outcome: Progressing   

## 2021-11-24 NOTE — Plan of Care (Signed)
  Problem: Elimination: Goal: Will not experience complications related to bowel motility Outcome: Not Progressing   Problem: Elimination: Goal: Will not experience complications related to urinary retention Outcome: Not Progressing    Problem: Safety: Goal: Ability to remain free from injury will improve Outcome: Not Progressing   Outcome: Not Progressing

## 2021-11-25 DIAGNOSIS — E870 Hyperosmolality and hypernatremia: Secondary | ICD-10-CM | POA: Diagnosis not present

## 2021-11-25 NOTE — Progress Notes (Signed)
PROGRESS NOTE    ORLAN AVERSA   [id number]:[id number] DOB: Feb 17, 1928  DOA: 11/22/2021 Date of Service: 11/25/21 PCP: Crecencio Mc, MD     Brief Narrative / Hospital Course:  Mr. Hasson Gaspard is a 88 year old male with vascular dementia with nighttime agitation, hypothyroid, constipation, anxiety, history of sundowning, who presents to the emergency department 11/22/2021 from home via EMS for chief concerns of poor p.o. intake for 1 month worse over past 2 days 08/09: sodium 151, creatinine 1.61, GFR 40, Free T4 / TSH was appropriate levels despite not on home medications per wife. COVID by PCR was negative. ED treatment: LR 500 mL bolus.  Admitted to hospitalist service for hyponatremia secondary to dehydration/poor p.o. intake.  Wife is requesting SNF placement, TOC consulted.  Synthroid restarted.  08/10: Sodium WNL at 141, potassium low at 3.0 and was repleted, creatinine improved to 1.31 with GFR 51.  Hgb dropped to 10.9 likely hemodilutional from IV fluids.  Placement pending, following potassium 08/11: Sodium and potassium WNL this morning.  SNF placement pending. 08/12: NO concerns on BMP. Remains calm overall but requiring sitter. SNF placement pending.    Consultants:  none  Procedures: none    Subjective: Unable to obtain subjective/ROS due to dementia. Pt is resting comfortably no concerns from RN      ASSESSMENT & PLAN:   Principal Problem:   Hypernatremia Active Problems:   BPH (benign prostatic hyperplasia)   Vascular dementia with behavioral disturbance (HCC)   Constipation   Acquired hypothyroidism   Sundowning   Dehydration   Hypokalemia   AKI (acute kidney injury) ([place])   Protein calorie malnutrition (HCC)   Weakness   Pressure injury of skin   Malnutrition of moderate degree   Hypokalemia repleted and resolved Presumed secondary to poor p.o. intake, malnutrition Magnesium level was within normal limits  AKI (acute kidney injury)  ([place]) Resolved Status post LR 500 mL bolus Recheck BMP in a few days  Hypernatremia Secondary to dehydration in setting of poor p.o. intake Resolved   Protein calorie malnutrition (HCC) moderate to severe Dietary has been consulted  Constipation Resolved - large BM 08/10 Asymptomatic on evaluation and physical exam Abdominal x-ray concerning for ileus 08/10 --> adjusted bowel regimen to scheduled Synthroid restarted, constipation may also be due to subclinical hypothyroid  Dehydration Resolved  Secondary to poor PO intake IV fluids d/c  Acquired hypothyroidism Versus sub-clinical TSH on admission was 5.287 Free T4 was within normal limits Per admitting physician's discussion with spouse over the phone, she did not know that her husband was prescribed thyroid medication and she has not given it to him in over a month, may explains why he has been more weak than baseline with poor p.o. intake in over a month, may explain constipation resumed his home hypothyroid medication however his TSH and free T4 is appropriate for his age Follow TSH outpatient  Sundowning Resumed home Seroquel 100 mg daily at 6 PM Placement/memory care pending  Vascular dementia with behavioral disturbance (HCC) Resumed Seroquel 100 mg at 6 PM daily and ordered Seroquel 50 mg nightly as needed Resumed home lorazepam 0.5 mg p.o. twice daily as needed for anxiety Placement pending for SNF/memory care  Weakness With poor p.o. intake no infection evident PT/OT as able given dementia Diet as tolerated Placement pending     DVT prophylaxis: lovenox Code Status: FULL Family Communication: TOC spoke w/ wife today Disposition Plan / TOC needs: SNF/memory care Barriers to discharge / significant pending  items: Placement             Objective: Vitals:   11/24/21 2009 11/25/21 0457 11/25/21 0927 11/25/21 1104  BP: 122/73 115/80 (!) 153/69 139/68  Pulse: 95 88 80 74  Resp: '19 16 17 17   '$ Temp: 98.5 F (36.9 C) 98 F (36.7 C) 97.7 F (36.5 C)   TempSrc: Oral Axillary    SpO2: 99% 95%    Weight:      Height:        Intake/Output Summary (Last 24 hours) at 11/25/2021 1359 Last data filed at 11/25/2021 1100 Gross per 24 hour  Intake --  Output 450 ml  Net -450 ml   Filed Weights   11/22/21 1609  Weight: 75.3 kg    Examination:  Constitutional:  VS as above General Appearance: NAD, pleasantly demented Respiratory: Normal respiratory effort Cardiovascular: S1/S2 normal No lower extremity edema Psychiatric: Poor judgment/insight Pleasant mood and affect       Scheduled Medications:   vitamin C  500 mg Oral BID   enoxaparin (LOVENOX) injection  40 mg Subcutaneous QHS   feeding supplement  237 mL Oral TID BM   levothyroxine  50 mcg Oral Q0600   lidocaine  1 Application Urethral Once   multivitamin with minerals  1 tablet Oral Daily   QUEtiapine  100 mg Oral q1800    Continuous Infusions:   PRN Medications:  acetaminophen **OR** acetaminophen, LORazepam, ondansetron **OR** ondansetron (ZOFRAN) IV, mouth rinse, polyethylene glycol, QUEtiapine, senna-docusate  Antimicrobials:  Anti-infectives (From admission, onward)    None       Data Reviewed: I have personally reviewed following labs and imaging studies  CBC: Recent Labs  Lab 11/22/21 1225 11/23/21 0504  WBC 10.4 8.8  NEUTROABS 6.3  --   HGB 13.1 10.9*  HCT 43.2 34.6*  MCV 90.9 89.6  PLT 304 485   Basic Metabolic Panel: Recent Labs  Lab 11/22/21 1225 11/23/21 0504 11/24/21 0442  NA 151* 141 142  K 3.3* 3.0* 4.0  CL 122* 114* 116*  CO2 '24 23 23  '$ GLUCOSE 93 100* 92  BUN 39* 30* 24*  CREATININE 1.61* 1.31* 1.21  CALCIUM 8.6* 7.5* 8.4*  MG 2.4  --  2.1   GFR: Estimated Creatinine Clearance: 37.7 mL/min (by C-G formula based on SCr of 1.21 mg/dL). Liver Function Tests: Recent Labs  Lab 11/22/21 1225  AST 36  ALT 63*  ALKPHOS 105  BILITOT 1.1  PROT 8.1  ALBUMIN  3.4*   Recent Labs  Lab 11/22/21 1225  LIPASE 36   No results for input(s): "AMMONIA" in the last 168 hours. Coagulation Profile: No results for input(s): "INR", "PROTIME" in the last 168 hours. Cardiac Enzymes: No results for input(s): "CKTOTAL", "CKMB", "CKMBINDEX", "TROPONINI" in the last 168 hours. BNP (last 3 results) No results for input(s): "PROBNP" in the last 8760 hours. HbA1C: No results for input(s): "HGBA1C" in the last 72 hours. CBG: No results for input(s): "GLUCAP" in the last 168 hours. Lipid Profile: No results for input(s): "CHOL", "HDL", "LDLCALC", "TRIG", "CHOLHDL", "LDLDIRECT" in the last 72 hours. Thyroid Function Tests: No results for input(s): "TSH", "T4TOTAL", "FREET4", "T3FREE", "THYROIDAB" in the last 72 hours.  Anemia Panel: No results for input(s): "VITAMINB12", "FOLATE", "FERRITIN", "TIBC", "IRON", "RETICCTPCT" in the last 72 hours. Urine analysis:    Component Value Date/Time   COLORURINE AMBER (A) 11/22/2021 0740   APPEARANCEUR CLOUDY (A) 11/22/2021 0740   LABSPEC 1.024 11/22/2021 0740   PHURINE 5.0  11/22/2021 0740   GLUCOSEU NEGATIVE 11/22/2021 0740   HGBUR LARGE (A) 11/22/2021 0740   BILIRUBINUR NEGATIVE 11/22/2021 0740   KETONESUR NEGATIVE 11/22/2021 0740   PROTEINUR 30 (A) 11/22/2021 0740   NITRITE NEGATIVE 11/22/2021 0740   LEUKOCYTESUR NEGATIVE 11/22/2021 0740   Sepsis Labs: '@LABRCNTIP'$ (procalcitonin:4,lacticidven:4)  Recent Results (from the past 240 hour(s))  SARS Coronavirus 2 by RT PCR (hospital order, performed in [facility] hospital lab) *cepheid single result test* Anterior Nasal Swab     Status: None   Collection Time: 11/22/21 12:25 PM   Specimen: Anterior Nasal Swab  Result Value Ref Range Status   SARS Coronavirus 2 by RT PCR NEGATIVE NEGATIVE Final    Comment: (NOTE) SARS-CoV-2 target nucleic acids are NOT DETECTED.  The SARS-CoV-2 RNA is generally detectable in upper and lower respiratory specimens during the  acute phase of infection. The lowest concentration of SARS-CoV-2 viral copies this assay can detect is 250 copies / mL. A negative result does not preclude SARS-CoV-2 infection and should not be used as the sole basis for treatment or other patient management decisions.  A negative result may occur with improper specimen collection / handling, submission of specimen other than nasopharyngeal swab, presence of viral mutation(s) within the areas targeted by this assay, and inadequate number of viral copies (<250 copies / mL). A negative result must be combined with clinical observations, patient history, and epidemiological information.  Fact Sheet for Patients:   https://www.patel.info/  Fact Sheet for Healthcare Providers: https://hall.com/  This test is not yet approved or  cleared by the [place] FDA and has been authorized for detection and/or diagnosis of SARS-CoV-2 by FDA under an Emergency Use Authorization (EUA).  This EUA will remain in effect (meaning this test can be used) for the duration of the COVID-19 declaration under Section 564(b)(1) of the Act, 21 U.S.C. section 360bbb-3(b)(1), unless the authorization is terminated or revoked sooner.  Performed at [facility], [address] [place], [place] [address]          Radiology Studies: DG Abd 1 View  Result Date: 11/22/2021 CLINICAL DATA:  Constipation.  Poor p.o. intake EXAM: ABDOMEN - 1 VIEW COMPARISON:  10/08/2018 FINDINGS: Gaseous distension of large and small bowel within the abdomen including a prominent dilated loop of bowel in the right abdomen measuring 9 cm in diameter, likely representing the cecum. There is a moderate volume of stool throughout the colon. No gross free intraperitoneal air. IMPRESSION: 1. Gaseous distension of large and small bowel with prominent dilated loop of bowel in the right abdomen, likely representing the cecum. Findings  favor colonic ileus. 2. Moderate volume of stool within the colon. Electronically Signed   By: Davina Poke D.O.   On: 11/22/2021 16:51   DG Chest Port 1 View  Result Date: 11/22/2021 CLINICAL DATA:  Decreased p.o. intake EXAM: PORTABLE CHEST 1 VIEW COMPARISON:  Chest x-ray dated November 14, 2017 FINDINGS: Cardiac and mediastinal contours are within normal limits. Elevation of the right hemidiaphragm. Calcified granulomas of the right lung, unchanged when compared to prior exam. Mild bibasilar opacities, likely due to atelectasis. No large pleural effusion or pneumothorax. Prominent gas-filled loops of bowel are seen under the bilateral hemidiaphragms. IMPRESSION: 1. Mild bibasilar opacities, likely due to atelectasis. 2. Prominent gas-filled loops of bowel are seen under the bilateral hemidiaphragms. Consider dedicated abdominal radiograph for further evaluation. Electronically Signed   By: Yetta Glassman M.D.   On: 11/22/2021 15:05  LOS: 2 days      Emeterio Reeve, DO [facility] 11/25/2021, 1:59 PM   Staff may message me via secure chat in [place]  but this may not receive immediate response,  please page for urgent matters!  If 7PM-7AM, please contact night-coverage [email or web address]  Dictation software was used to generate the above note. Typos may occur and escape review, as with typed/written notes. Please contact Dr Sheppard Coil directly for clarity if needed.

## 2021-11-25 NOTE — Plan of Care (Signed)
  Problem: Pain Managment: Goal: General experience of comfort will improve Outcome: Progressing   Problem: Safety: Goal: Ability to remain free from injury will improve Outcome: Progressing   

## 2021-11-26 DIAGNOSIS — E870 Hyperosmolality and hypernatremia: Secondary | ICD-10-CM | POA: Diagnosis not present

## 2021-11-26 LAB — BASIC METABOLIC PANEL
Anion gap: 5 (ref 5–15)
BUN: 24 mg/dL — ABNORMAL HIGH (ref 8–23)
CO2: 23 mmol/L (ref 22–32)
Calcium: 8.5 mg/dL — ABNORMAL LOW (ref 8.9–10.3)
Chloride: 112 mmol/L — ABNORMAL HIGH (ref 98–111)
Creatinine, Ser: 1.14 mg/dL (ref 0.61–1.24)
GFR, Estimated: >60 mL/min (ref >60)
Glucose, Bld: 105 mg/dL — ABNORMAL HIGH (ref 70–99)
Potassium: 3.6 mmol/L (ref 3.5–5.1)
Sodium: 140 mmol/L (ref 135–145)

## 2021-11-26 LAB — CBC
HCT: 38.6 % — ABNORMAL LOW (ref 39.0–52.0)
Hemoglobin: 12.4 g/dL — ABNORMAL LOW (ref 13.0–17.0)
MCH: 27.9 pg (ref 26.0–34.0)
MCHC: 32.1 g/dL (ref 30.0–36.0)
MCV: 86.9 fL (ref 80.0–100.0)
Platelets: 256 10*3/uL (ref 150–400)
RBC: 4.44 MIL/uL (ref 4.22–5.81)
RDW: 14 % (ref 11.5–15.5)
WBC: 9.6 10*3/uL (ref 4.0–10.5)
nRBC: 0 % (ref 0.0–0.2)

## 2021-11-26 NOTE — Progress Notes (Signed)
PROGRESS NOTE    Corey Todd   SWN:[id number] DOB: 22-Mar-1928  DOA: 11/22/2021 Date of Service: 11/26/21 PCP: Crecencio Mc, MD     Brief Narrative / Hospital Course:  [name] is a 88 year old male with vascular dementia with nighttime agitation, hypothyroid, constipation, anxiety, history of sundowning, who presents to the emergency department 11/22/2021 from home via EMS for chief concerns of poor p.o. intake for 1 month worse over past 2 days 08/09: sodium 151, creatinine 1.61, GFR 40, Free T4 / TSH was appropriate levels despite not on home medications per wife. COVID by PCR was negative. ED treatment: LR 500 mL bolus.  Admitted to hospitalist service for hyponatremia secondary to dehydration/poor p.o. intake.  Wife is requesting SNF placement, TOC consulted.  Synthroid restarted.  08/10: Sodium WNL at 141, potassium low at 3.0 and was repleted, creatinine improved to 1.31 with GFR 51.  Hgb dropped to 10.9 likely hemodilutional from IV fluids.  Placement pending, following potassium 08/11-08/13: No concerns on BMP. Remains calm overall but requiring sitter. SNF placement pending.    Consultants:  none  Procedures: none    Subjective: Unable to obtain subjective/ROS due to dementia. Pt is resting comfortably no concerns from RN, he is alert but not talking / answering questions at this point     ASSESSMENT & PLAN:   Principal Problem:   Hypernatremia Active Problems:   BPH (benign prostatic hyperplasia)   Vascular dementia with behavioral disturbance (HCC)   Constipation   Acquired hypothyroidism   Sundowning   Dehydration   Hypokalemia   AKI (acute kidney injury) ([place])   Protein calorie malnutrition (HCC)   Weakness   Pressure injury of skin   Malnutrition of moderate degree   Hypokalemia repleted and resolved Presumed secondary to poor p.o. intake, malnutrition Magnesium level was within normal limits Monitor periodic BMP  AKI (acute  kidney injury) ([place]) Resolved Status post LR 500 mL bolus Recheck BMP in a few days  Hypernatremia Secondary to dehydration in setting of poor p.o. intake Resolved  Monitor periodic BMP  Protein calorie malnutrition (HCC) moderate to severe Dietary has been consulted  Constipation Resolved - large BM 08/10 Asymptomatic on evaluation and physical exam Abdominal x-ray concerning for ileus 08/10 --> adjusted bowel regimen to scheduled Synthroid restarted, constipation may also be due to subclinical hypothyroid  Dehydration Resolved  Secondary to poor PO intake IV fluids d/c Encourage po   Acquired hypothyroidism Versus sub-clinical TSH on admission was 5.287 Free T4 was within normal limits Per admitting physician's discussion with spouse over the phone, she did not know that her husband was prescribed thyroid medication and she has not given it to him in over a month, may explains why he has been more weak than baseline with poor p.o. intake in over a month, may explain constipation resumed his home hypothyroid medication however his TSH and free T4 is appropriate for his age Follow TSH outpatient  Sundowning Resumed home Seroquel 100 mg daily at 6 PM Placement/memory care pending  Vascular dementia with behavioral disturbance (HCC) Resumed Seroquel 100 mg at 6 PM daily and ordered Seroquel 50 mg nightly as needed Resumed home lorazepam 0.5 mg p.o. twice daily as needed for anxiety Placement pending for SNF/memory care  Weakness With poor p.o. intake no infection evident PT/OT as able given dementia Diet as tolerated Placement pending     DVT prophylaxis: lovenox Code Status: FULL Family Communication: none at this time  Disposition Plan / TOC  needs: SNF/memory care Barriers to discharge / significant pending items: Placement             Objective: Vitals:   11/25/21 1506 11/25/21 1945 11/26/21 0500 11/26/21 1012  BP: (!) 167/74 (!) 143/76 139/74  138/71  Pulse: 78 80 77 77  Resp: '17 16 18 17  '$ Temp: 97.7 F (36.5 C) 97.7 F (36.5 C) 97.9 F (36.6 C) 97.7 F (36.5 C)  TempSrc:  Oral Oral Oral  SpO2:  96% 97% 97%  Weight:      Height:        Intake/Output Summary (Last 24 hours) at 11/26/2021 1145 Last data filed at 11/26/2021 0998 Gross per 24 hour  Intake 416 ml  Output 600 ml  Net -184 ml   Filed Weights   11/22/21 1609  Weight: 75.3 kg    Examination:  Constitutional:  VS as above General Appearance: NAD, pleasantly demented Respiratory: Normal respiratory effort CTABL Cardiovascular: S1/S2 normal No lower extremity edema Psychiatric: Poor judgment/insight Pleasant mood and affect       Scheduled Medications:   vitamin C  500 mg Oral BID   enoxaparin (LOVENOX) injection  40 mg Subcutaneous QHS   feeding supplement  237 mL Oral TID BM   levothyroxine  50 mcg Oral Q0600   lidocaine  1 Application Urethral Once   multivitamin with minerals  1 tablet Oral Daily   QUEtiapine  100 mg Oral q1800    Continuous Infusions:   PRN Medications:  acetaminophen **OR** acetaminophen, LORazepam, ondansetron **OR** ondansetron (ZOFRAN) IV, mouth rinse, polyethylene glycol, QUEtiapine, senna-docusate  Antimicrobials:  Anti-infectives (From admission, onward)    None       Data Reviewed: I have personally reviewed following labs and imaging studies  CBC: Recent Labs  Lab 11/22/21 1225 11/23/21 0504  WBC 10.4 8.8  NEUTROABS 6.3  --   HGB 13.1 10.9*  HCT 43.2 34.6*  MCV 90.9 89.6  PLT 304 338   Basic Metabolic Panel: Recent Labs  Lab 11/22/21 1225 11/23/21 0504 11/24/21 0442  NA 151* 141 142  K 3.3* 3.0* 4.0  CL 122* 114* 116*  CO2 '24 23 23  '$ GLUCOSE 93 100* 92  BUN 39* 30* 24*  CREATININE 1.61* 1.31* 1.21  CALCIUM 8.6* 7.5* 8.4*  MG 2.4  --  2.1   GFR: Estimated Creatinine Clearance: 37.7 mL/min (by C-G formula based on SCr of 1.21 mg/dL). Liver Function Tests: Recent Labs  Lab  11/22/21 1225  AST 36  ALT 63*  ALKPHOS 105  BILITOT 1.1  PROT 8.1  ALBUMIN 3.4*   Recent Labs  Lab 11/22/21 1225  LIPASE 36   No results for input(s): "AMMONIA" in the last 168 hours. Coagulation Profile: No results for input(s): "INR", "PROTIME" in the last 168 hours. Cardiac Enzymes: No results for input(s): "CKTOTAL", "CKMB", "CKMBINDEX", "TROPONINI" in the last 168 hours. BNP (last 3 results) No results for input(s): "PROBNP" in the last 8760 hours. HbA1C: No results for input(s): "HGBA1C" in the last 72 hours. CBG: No results for input(s): "GLUCAP" in the last 168 hours. Lipid Profile: No results for input(s): "CHOL", "HDL", "LDLCALC", "TRIG", "CHOLHDL", "LDLDIRECT" in the last 72 hours. Thyroid Function Tests: No results for input(s): "TSH", "T4TOTAL", "FREET4", "T3FREE", "THYROIDAB" in the last 72 hours.  Anemia Panel: No results for input(s): "VITAMINB12", "FOLATE", "FERRITIN", "TIBC", "IRON", "RETICCTPCT" in the last 72 hours. Urine analysis:    Component Value Date/Time   COLORURINE AMBER (A) 11/22/2021 0740  APPEARANCEUR CLOUDY (A) 11/22/2021 0740   LABSPEC 1.024 11/22/2021 0740   PHURINE 5.0 11/22/2021 0740   GLUCOSEU NEGATIVE 11/22/2021 0740   HGBUR LARGE (A) 11/22/2021 0740   BILIRUBINUR NEGATIVE 11/22/2021 0740   KETONESUR NEGATIVE 11/22/2021 0740   PROTEINUR 30 (A) 11/22/2021 0740   NITRITE NEGATIVE 11/22/2021 0740   LEUKOCYTESUR NEGATIVE 11/22/2021 0740   Sepsis Labs: '@LABRCNTIP'$ (procalcitonin:4,lacticidven:4)  Recent Results (from the past 240 hour(s))  SARS Coronavirus 2 by RT PCR (hospital order, performed in [facility] hospital lab) *cepheid single result test* Anterior Nasal Swab     Status: None   Collection Time: 11/22/21 12:25 PM   Specimen: Anterior Nasal Swab  Result Value Ref Range Status   SARS Coronavirus 2 by RT PCR NEGATIVE NEGATIVE Final    Comment: (NOTE) SARS-CoV-2 target nucleic acids are NOT DETECTED.  The SARS-CoV-2  RNA is generally detectable in upper and lower respiratory specimens during the acute phase of infection. The lowest concentration of SARS-CoV-2 viral copies this assay can detect is 250 copies / mL. A negative result does not preclude SARS-CoV-2 infection and should not be used as the sole basis for treatment or other patient management decisions.  A negative result may occur with improper specimen collection / handling, submission of specimen other than nasopharyngeal swab, presence of viral mutation(s) within the areas targeted by this assay, and inadequate number of viral copies (<250 copies / mL). A negative result must be combined with clinical observations, patient history, and epidemiological information.  Fact Sheet for Patients:   https://www.patel.info/  Fact Sheet for Healthcare Providers: https://hall.com/  This test is not yet approved or  cleared by the [place] FDA and has been authorized for detection and/or diagnosis of SARS-CoV-2 by FDA under an Emergency Use Authorization (EUA).  This EUA will remain in effect (meaning this test can be used) for the duration of the COVID-19 declaration under Section 564(b)(1) of the Act, 21 U.S.C. section 360bbb-3(b)(1), unless the authorization is terminated or revoked sooner.  Performed at [facility], [address] [place], [place] [address]          Radiology Studies: DG Abd 1 View  Result Date: 11/22/2021 CLINICAL DATA:  Constipation.  Poor p.o. intake EXAM: ABDOMEN - 1 VIEW COMPARISON:  10/08/2018 FINDINGS: Gaseous distension of large and small bowel within the abdomen including a prominent dilated loop of bowel in the right abdomen measuring 9 cm in diameter, likely representing the cecum. There is a moderate volume of stool throughout the colon. No gross free intraperitoneal air. IMPRESSION: 1. Gaseous distension of large and small bowel with prominent dilated  loop of bowel in the right abdomen, likely representing the cecum. Findings favor colonic ileus. 2. Moderate volume of stool within the colon. Electronically Signed   By: Davina Poke D.O.   On: 11/22/2021 16:51   DG Chest Port 1 View  Result Date: 11/22/2021 CLINICAL DATA:  Decreased p.o. intake EXAM: PORTABLE CHEST 1 VIEW COMPARISON:  Chest x-ray dated November 14, 2017 FINDINGS: Cardiac and mediastinal contours are within normal limits. Elevation of the right hemidiaphragm. Calcified granulomas of the right lung, unchanged when compared to prior exam. Mild bibasilar opacities, likely due to atelectasis. No large pleural effusion or pneumothorax. Prominent gas-filled loops of bowel are seen under the bilateral hemidiaphragms. IMPRESSION: 1. Mild bibasilar opacities, likely due to atelectasis. 2. Prominent gas-filled loops of bowel are seen under the bilateral hemidiaphragms. Consider dedicated abdominal radiograph for further evaluation. Electronically Signed  By: Yetta Glassman M.D.   On: 11/22/2021 15:05            LOS: 3 days      Emeterio Reeve, DO [facility] 11/26/2021, 11:45 AM   Staff may message me via secure chat in [place]  but this may not receive immediate response,  please page for urgent matters!  If 7PM-7AM, please contact night-coverage [email or web address]  Dictation software was used to generate the above note. Typos may occur and escape review, as with typed/written notes. Please contact Dr Sheppard Coil directly for clarity if needed.

## 2021-11-27 DIAGNOSIS — E870 Hyperosmolality and hypernatremia: Secondary | ICD-10-CM | POA: Diagnosis not present

## 2021-11-27 NOTE — NC FL2 (Signed)
[place] LEVEL OF CARE SCREENING TOOL     IDENTIFICATION  Patient Name: Corey Todd Birthdate: 03-19-1928 Sex: male Admission Date (Current Location): 11/22/2021  [facility] and [place] Number:  Engineering geologist and Address:         Provider Number: [id number]  Attending Physician Name and Address:  Emeterio Reeve, DO  Relative Name and Phone Number:       Current Level of Care: Hospital Recommended Level of Care: [place] Prior Approval Number:    Date Approved/Denied:   PASRR Number: [id number]  Discharge Plan: SNF    Current Diagnoses: Patient Active Problem List   Diagnosis Date Noted   Malnutrition of moderate degree 11/23/2021   Dehydration 11/22/2021   Hypokalemia 11/22/2021   AKI (acute kidney injury) ([place]) 11/22/2021   Hypernatremia 11/22/2021   Protein calorie malnutrition ([place]) 11/22/2021   Weakness 11/22/2021   Pressure injury of skin 11/22/2021   Incontinence of feces 04/20/2021   Weight gain 04/20/2021   Sundowning 05/02/2020   Cerumen impaction 12/23/2019   Acquired hypothyroidism 12/23/2019   Constipation 09/29/2018   Vascular dementia with behavioral disturbance ([place]) 04/20/2018   Incontinence of urine 04/20/2018   Iron deficiency anemia 12/15/2017   Hospital discharge follow-up 12/15/2017   Bilateral lower extremity edema 11/25/2017   Closed right hip fracture, sequela 11/14/2017   Right leg swelling 10/17/2017   Proximal leg weakness 10/17/2017   Herpes zoster without complication 04/18/7251   Encounter for preventive health examination 02/14/2017   Skin lesion of face 09/09/2015   Counseling regarding advanced directives and goals of care 12/26/2014   Do not resuscitate discussion 12/26/2014   Hypertension 08/07/2014   Encounter for Medicare annual wellness exam 11/18/2012   GERD (gastroesophageal reflux disease) 05/09/2012   Presbyacusis    BPH (benign prostatic hyperplasia)      Orientation RESPIRATION BLADDER Height & Weight     Self  Normal Incontinent Weight: 75.3 kg Height:  '5\' 8"'$  (172.7 cm)  BEHAVIORAL SYMPTOMS/MOOD NEUROLOGICAL BOWEL NUTRITION STATUS  Dangerous to self, others or property, Other (Comment) Conservator, museum/gallery)   Incontinent Diet (DYS 3)  AMBULATORY STATUS COMMUNICATION OF NEEDS Skin   Limited Assist Verbally PU Stage and Appropriate Care, Skin abrasions                       Personal Care Assistance Level of Assistance              Functional Limitations Info             SPECIAL CARE FACTORS FREQUENCY  PT (By licensed PT), OT (By licensed OT)                    Contractures Contractures Info: Not present    Additional Factors Info  Code Status, Allergies Code Status Info: DNR Allergies Info: NKDA           Current Medications (11/27/2021):  This is the current hospital active medication list Current Facility-Administered Medications  Medication Dose Route Frequency Provider Last Rate Last Admin   ascorbic acid (VITAMIN C) tablet 500 mg  500 mg Oral BID Emeterio Reeve, DO   500 mg at 11/27/21 1338   enoxaparin (LOVENOX) injection 40 mg  40 mg Subcutaneous QHS Cox, Amy N, DO   40 mg at 11/26/21 2159   feeding supplement (ENSURE ENLIVE / ENSURE PLUS) liquid 237 mL  237 mL Oral TID BM Emeterio Reeve, DO  237 mL at 11/27/21 1339   levothyroxine (SYNTHROID) tablet 50 mcg  50 mcg Oral Q0600 Cox, Amy N, DO   50 mcg at 11/27/21 5284   lidocaine (XYLOCAINE) 2 % jelly 1 Application  1 Application Urethral Once Cox, Amy N, DO       LORazepam (ATIVAN) tablet 0.5 mg  0.5 mg Oral BID PRN Cox, Amy N, DO   0.5 mg at 11/25/21 2006   multivitamin with minerals tablet 1 tablet  1 tablet Oral Daily Emeterio Reeve, DO   1 tablet at 11/27/21 1339   Oral care mouth rinse  15 mL Mouth Rinse PRN Cox, Amy N, DO       polyethylene glycol (MIRALAX / GLYCOLAX) packet 17 g  17 g Oral Daily PRN Emeterio Reeve, DO        QUEtiapine (SEROQUEL) tablet 100 mg  100 mg Oral q1800 Cox, Amy N, DO   100 mg at 11/26/21 1812   QUEtiapine (SEROQUEL) tablet 50 mg  50 mg Oral QHS PRN Cox, Amy N, DO   50 mg at 11/25/21 2006   senna-docusate (Senokot-S) tablet 2 tablet  2 tablet Oral QHS PRN Emeterio Reeve, DO         Discharge Medications: Please see discharge summary for a list of discharge medications.  Relevant Imaging Results:  Relevant Lab Results:   Additional Information ss [id number]  Beverly Sessions, RN

## 2021-11-27 NOTE — Progress Notes (Signed)
Mobility Specialist - Progress Note    11/27/21 1638  Mobility  Activity Ambulated with assistance in hallway;Stood at bedside;Dangled on edge of bed  Level of Assistance Minimal assist, patient does 75% or more  Assistive Device Front wheel walker  Distance Ambulated (ft) 80 ft  Activity Response Tolerated well  $Mobility charge 1 Mobility    Pt attempting to exit chair upon arrival, confused throughout session. Ambulates 87f MinA with heavy vc's for safety and hand placement. Author steered RW multiple times for correct walker use when turning. Pt returns to bed with needs in reach, bed alarm set, NT present.  MMerrily BrittleMobility Specialist 11/27/21, 4:40 PM

## 2021-11-27 NOTE — Plan of Care (Signed)
  Problem: Activity: Goal: Risk for activity intolerance will decrease Outcome: Progressing   Problem: Pain Managment: Goal: General experience of comfort will improve Outcome: Progressing   Problem: Safety: Goal: Ability to remain free from injury will improve Outcome: Progressing   

## 2021-11-27 NOTE — TOC Progression Note (Signed)
Transition of Care [facility]) - Progression Note    Patient Details  Name: Corey Todd MRN: [id number] Date of Birth: 10-03-1927  [facility]) CM/SW Contact  Beverly Sessions, RN Phone Number: 11/27/2021, 2:59 PM  Clinical Narrative:     Received call from [facility] stating that patient has been approved for 30 day optium contract.  The rep stated once facility has been secured to call Rennis Golden at [phone].  Wife updated.  Referral sent to all facilities listed below   [facility])                                         *updated 10/06/2021    [facility]    [address]    [place], [place] [address] [place], [place] [address]    Phone:  [phone] Phone: [phone]    Fax:  [phone] Fax: [phone]    Admissions: Rogelia Rohrer  [email or web address]) Admissions: Maryan Rued  [email or web address])                    *SMOKE FREE FACILITY *SMOKE FREE FACILITY    Specialty Care: Dialysis, Peg tube, NG tube, 300 lb. weight limit Specialty Care: Dialysis, Peg tube, NG tube,   350 lb. weight limit           [facility] [facility]     [address] [place]    [place], [place] [address] [place], [place] [address]    Phone: [phone] Phone: [phone]     Fax: [phone] Fax: [phone]    Admissions:  Maximiano Coss- [facility]    Altha [email or web address]) Admissions: Manus Rudd ([email or web address])    *SMOKE FREE FACILITY *SMOKE FREE FACILITY    Specialty Care: Dialysis, Peg Tube, NG Tube, TPN, 300 lb. weight limit Specialty Care: Dialysis, Peg Tube, Established Trach, 350- 400  lb. weight limit           [facility]     [facility]          **ADMISSIONS HOLD EFFECTIVE 11/02/21**    [facility]    [address]    [place], [place] [address] [place], [place] [address]    Phone: [phone] Phone: [phone]    Fax: [phone] Fax:  [phone]    Admissions: [facility] ([email or web address]) Admissions: Rise Mu ([email or web address])                     Fax: [phone]        Smoking area outside *[place]: Dialysis, Peg tube/ NG tube, 325 lb. weight limit.  Specialty Care: Dialysis, Trach, Peg Tube, 350 lb. weight limit           [facility] [place]    [address] [place], [place] [address] [place], [place] [address]    Phone: [phone] Phone: [phone]    Fax: [phone] Fax: [phone]    Admissions: Dione Booze ([email or web address])                           P: [phone]          Admissions: Angela Burke ([email or web address])    *SMOKE FREE FACILITY Smoking area outside    Specialty Care: Dialysis, Trach, Peg Tube, 400 lb. weight limit Speciality Care: Dialysis, Peg tube/ NG tube, 400 lb. weight limit.            [facility] AT [facility]                 [address]      [place], [place] [address]      Phone: [phone]      Fax: [phone]      Admissions:  STR, short stay and Respite- Jerilynn Som  ([email or web address]) or LTC-  Dorene Sorrow ([email or web address]) [phone]      *SMOKE FREE FACILITY      Specialty Care: Secured/ memory care unit,  dialysis, Trach, Peg tube, TPN, 500 lb. weight limit                   Expected Discharge Plan and Services                                                  Social Determinants of Health (SDOH) Interventions    Readmission Risk Interventions     No data to display

## 2021-11-27 NOTE — Plan of Care (Signed)

## 2021-11-27 NOTE — Progress Notes (Signed)
Physical Therapy Treatment Patient Details Name: HARPER SMOKER MRN: [id number] DOB: 1927/09/29 Today's Date: 11/27/2021   History of Present Illness Mr. Montrel Donahoe is a 88 year old male with vascular dementia with nighttime agitation, hypothyroid, constipation, anxiety, history of sundowning, who presents to the emergency department from home for chief concerns of poor p.o. intake for 2 days.    PT Comments    Pt received in bed with sitter by his side agreeable to participate in therapy. Pt continues to be pleasantly confused requiring max VC for initiating task, direction, and sequencing, safety. Pt performed bed mobility with sup and transfer OOB to chair with Min assist using RW . Ambulated in the hallway with min assist and used bathroom for BM. Pt mumbled constantly during the session. Pt performed AROM with Max assist for proper technique and to remain on task. Pt will benefit form SNF.   Recommendations for follow up therapy are one component of a multi-disciplinary discharge planning process, led by the attending physician.  Recommendations may be updated based on patient status, additional functional criteria and insurance authorization.  Follow Up Recommendations  Home health PT     Assistance Recommended at Discharge Frequent or constant Supervision/Assistance  Patient can return home with the following A little help with walking and/or transfers;A lot of help with bathing/dressing/bathroom;Assistance with cooking/housework;Assistance with feeding;Direct supervision/assist for medications management;Direct supervision/assist for financial management;Assist for transportation;Help with stairs or ramp for entrance   Equipment Recommendations  None recommended by PT    Recommendations for Other Services       Precautions / Restrictions Precautions Precautions: Fall Restrictions Weight Bearing Restrictions: No     Mobility  Bed Mobility Overal bed mobility: Needs  Assistance Bed Mobility: Supine to Sit     Supine to sit: Supervision, HOB elevated     General bed mobility comments: needs initiation.    Transfers Overall transfer level: Needs assistance Equipment used: Rolling walker (2 wheels) Transfers: Sit to/from Stand, Bed to chair/wheelchair/BSC Sit to Stand: Min assist   Step pivot transfers: Min guard            Ambulation/Gait Ambulation/Gait assistance: Min assist Gait Distance (Feet): 112 Feet Assistive device: Rolling walker (2 wheels) Gait Pattern/deviations: Decreased stride length, Shuffle, Decreased dorsiflexion - right, Decreased dorsiflexion - left Gait velocity: decreased         Stairs n/a             Wheelchair Mobility    Modified Rankin (Stroke Patients Only)       Balance Overall balance assessment: Needs assistance Sitting-balance support: Feet supported, Bilateral upper extremity supported Sitting balance-Leahy Scale: Good     Standing balance support: Bilateral upper extremity supported Standing balance-Leahy Scale: Fair Standing balance comment: unsteady due to lack of direction and cognitve impairment.                            Cognition Arousal/Alertness: Awake/alert Behavior During Therapy: Impulsive Overall Cognitive Status: History of cognitive impairments - at baseline Area of Impairment: Orientation, Attention, Memory, Following commands, Safety/judgement, Problem solving, Awareness                 Orientation Level: Disoriented to, Place, Time, Situation Current Attention Level: Alternating   Following Commands: Follows one step commands inconsistently Safety/Judgement: Decreased awareness of safety   Problem Solving: Difficulty sequencing, Slow processing, Requires verbal cues, Requires tactile cues General Comments: Pt pleasantly confused  Exercises General Exercises - Lower Extremity Ankle Circles/Pumps: AROM, 10 reps, Seated Long Arc  Quad: AROM, 10 reps, Seated Hip Flexion/Marching: AROM, 10 reps, Seated    General Comments        Pertinent Vitals/Pain Pain Assessment Pain Assessment: No/denies pain    Home Living                          Prior Function            PT Goals (current goals can now be found in the care plan section) Acute Rehab PT Goals PT Goal Formulation: Patient unable to participate in goal setting Time For Goal Achievement: 12/07/21 Potential to Achieve Goals: Good Progress towards PT goals: Progressing toward goals    Frequency    Min 2X/week      PT Plan Current plan remains appropriate    Co-evaluation              AM-PAC PT "6 Clicks" Mobility   Outcome Measure  Help needed turning from your back to your side while in a flat bed without using bedrails?: None Help needed moving from lying on your back to sitting on the side of a flat bed without using bedrails?: A Little Help needed moving to and from a bed to a chair (including a wheelchair)?: A Little Help needed standing up from a chair using your arms (e.g., wheelchair or bedside chair)?: A Little Help needed to walk in hospital room?: A Lot (max VC) Help needed climbing 3-5 steps with a railing? : Total 6 Click Score: 16    End of Session         PT Visit Diagnosis: Unsteadiness on feet (R26.81);Muscle weakness (generalized) (M62.81);Difficulty in walking, not elsewhere classified (R26.2)     Time: 7371-0626 PT Time Calculation (min) (ACUTE ONLY): 34 min  Charges:  $Gait Training: 8-22 mins $Therapeutic Activity: 8-22 mins                     Joaquin Music PT DPT 4:23 PM,11/27/21

## 2021-11-27 NOTE — Progress Notes (Signed)
PROGRESS NOTE    Corey Todd   XFG:[id number] DOB: June 16, 1927  DOA: 11/22/2021 Date of Service: 11/27/21 PCP: Crecencio Mc, MD     Brief Narrative / Hospital Course:  [name] is a 88 year old male with vascular dementia with nighttime agitation, hypothyroid, constipation, anxiety, history of sundowning, who presents to the emergency department 11/22/2021 from home via EMS for chief concerns of poor p.o. intake for 1 month worse over past 2 days 08/09: sodium 151, creatinine 1.61, GFR 40, Free T4 / TSH was appropriate levels despite not on home medications per wife. COVID by PCR was negative. ED treatment: LR 500 mL bolus.  Admitted to hospitalist service for hyponatremia secondary to dehydration/poor p.o. intake.  Wife is requesting SNF placement, TOC consulted.  Synthroid restarted.  08/10: Sodium WNL at 141, potassium low at 3.0 and was repleted, creatinine improved to 1.31 with GFR 51.  Hgb dropped to 10.9 likely hemodilutional from IV fluids.  Placement pending, following potassium 08/11-08/14: No concerns on BMP/CBC. Remains calm overall but requiring sitter. SNF placement pending.    Consultants:  none  Procedures: none    Subjective: Unable to obtain subjective/ROS due to dementia. Pt is resting comfortably no concerns from RN     ASSESSMENT & PLAN:   Principal Problem:   Hypernatremia Active Problems:   BPH (benign prostatic hyperplasia)   Vascular dementia with behavioral disturbance (HCC)   Constipation   Acquired hypothyroidism   Sundowning   Dehydration   Hypokalemia   AKI (acute kidney injury) ([place])   Protein calorie malnutrition (HCC)   Weakness   Pressure injury of skin   Malnutrition of moderate degree   Hypokalemia repleted and resolved Presumed secondary to poor p.o. intake, malnutrition Magnesium level was within normal limits Monitor periodic BMP  AKI (acute kidney injury) ([place]) Resolved Status post LR 500 mL  bolus Recheck BMP in a few days  Hypernatremia Secondary to dehydration in setting of poor p.o. intake Resolved  Monitor periodic BMP  Protein calorie malnutrition (HCC) moderate to severe Dietary has been consulted  Constipation Resolved - large BM 08/10 Asymptomatic on evaluation and physical exam Abdominal x-ray concerning for ileus 08/10 --> adjusted bowel regimen to scheduled Synthroid restarted, constipation may also be due to subclinical hypothyroid  Dehydration Resolved  Secondary to poor PO intake IV fluids d/c Encourage po   Acquired hypothyroidism Versus sub-clinical TSH on admission was 5.287 Free T4 was within normal limits Per admitting physician's discussion with spouse over the phone, she did not know that her husband was prescribed thyroid medication and she has not given it to him in over a month, may explains why he has been more weak than baseline with poor p.o. intake in over a month, may explain constipation resumed his home hypothyroid medication however his TSH and free T4 is appropriate for his age Follow TSH outpatient  Sundowning Resumed home Seroquel 100 mg daily at 6 PM Placement/memory care pending  Vascular dementia with behavioral disturbance (HCC) Resumed Seroquel 100 mg at 6 PM daily and ordered Seroquel 50 mg nightly as needed Resumed home lorazepam 0.5 mg p.o. twice daily as needed for anxiety Placement pending for SNF/memory care  Weakness With poor p.o. intake no infection evident PT/OT as able given dementia Diet as tolerated Placement pending     DVT prophylaxis: lovenox Code Status: FULL Family Communication: none at this time  Disposition Plan / TOC needs: SNF/memory care Barriers to discharge / significant pending items: difficult placement  Objective: Vitals:   11/26/21 1536 11/26/21 2103 11/27/21 0628 11/27/21 0837  BP: 128/73 (!) 100/59 118/71 (!) 164/59  Pulse: 78 70 73 65  Resp: '17 18 16  16  '$ Temp: 98.4 F (36.9 C) 98.4 F (36.9 C) 98.1 F (36.7 C) 98.3 F (36.8 C)  TempSrc: Oral Oral Oral   SpO2: 96% 95% 94% 98%  Weight:      Height:        Intake/Output Summary (Last 24 hours) at 11/27/2021 1356 Last data filed at 11/27/2021 1330 Gross per 24 hour  Intake 360 ml  Output 400 ml  Net -40 ml   Filed Weights   11/22/21 1609  Weight: 75.3 kg    Examination:  Constitutional:  VS as above General Appearance: NAD, pleasantly demented Respiratory: Normal respiratory effort CTABL Cardiovascular: S1/S2 normal No lower extremity edema Psychiatric: Poor judgment/insight Pleasant mood and affect       Scheduled Medications:   vitamin C  500 mg Oral BID   enoxaparin (LOVENOX) injection  40 mg Subcutaneous QHS   feeding supplement  237 mL Oral TID BM   levothyroxine  50 mcg Oral Q0600   lidocaine  1 Application Urethral Once   multivitamin with minerals  1 tablet Oral Daily   QUEtiapine  100 mg Oral q1800    Continuous Infusions:   PRN Medications:  acetaminophen **OR** acetaminophen, LORazepam, ondansetron **OR** ondansetron (ZOFRAN) IV, mouth rinse, polyethylene glycol, QUEtiapine, senna-docusate  Antimicrobials:  Anti-infectives (From admission, onward)    None       Data Reviewed: I have personally reviewed following labs and imaging studies  CBC: Recent Labs  Lab 11/22/21 1225 11/23/21 0504 11/26/21 1215  WBC 10.4 8.8 9.6  NEUTROABS 6.3  --   --   HGB 13.1 10.9* 12.4*  HCT 43.2 34.6* 38.6*  MCV 90.9 89.6 86.9  PLT 304 207 701   Basic Metabolic Panel: Recent Labs  Lab 11/22/21 1225 11/23/21 0504 11/24/21 0442 11/26/21 1215  NA 151* 141 142 140  K 3.3* 3.0* 4.0 3.6  CL 122* 114* 116* 112*  CO2 '24 23 23 23  '$ GLUCOSE 93 100* 92 105*  BUN 39* 30* 24* 24*  CREATININE 1.61* 1.31* 1.21 1.14  CALCIUM 8.6* 7.5* 8.4* 8.5*  MG 2.4  --  2.1  --    GFR: Estimated Creatinine Clearance: 40 mL/min (by C-G formula based on SCr of  1.14 mg/dL). Liver Function Tests: Recent Labs  Lab 11/22/21 1225  AST 36  ALT 63*  ALKPHOS 105  BILITOT 1.1  PROT 8.1  ALBUMIN 3.4*   Recent Labs  Lab 11/22/21 1225  LIPASE 36   No results for input(s): "AMMONIA" in the last 168 hours. Coagulation Profile: No results for input(s): "INR", "PROTIME" in the last 168 hours. Cardiac Enzymes: No results for input(s): "CKTOTAL", "CKMB", "CKMBINDEX", "TROPONINI" in the last 168 hours. BNP (last 3 results) No results for input(s): "PROBNP" in the last 8760 hours. HbA1C: No results for input(s): "HGBA1C" in the last 72 hours. CBG: No results for input(s): "GLUCAP" in the last 168 hours. Lipid Profile: No results for input(s): "CHOL", "HDL", "LDLCALC", "TRIG", "CHOLHDL", "LDLDIRECT" in the last 72 hours. Thyroid Function Tests: No results for input(s): "TSH", "T4TOTAL", "FREET4", "T3FREE", "THYROIDAB" in the last 72 hours.  Anemia Panel: No results for input(s): "VITAMINB12", "FOLATE", "FERRITIN", "TIBC", "IRON", "RETICCTPCT" in the last 72 hours. Urine analysis:    Component Value Date/Time   COLORURINE AMBER (A) 11/22/2021 0740  APPEARANCEUR CLOUDY (A) 11/22/2021 0740   LABSPEC 1.024 11/22/2021 0740   PHURINE 5.0 11/22/2021 0740   GLUCOSEU NEGATIVE 11/22/2021 0740   HGBUR LARGE (A) 11/22/2021 0740   BILIRUBINUR NEGATIVE 11/22/2021 0740   KETONESUR NEGATIVE 11/22/2021 0740   PROTEINUR 30 (A) 11/22/2021 0740   NITRITE NEGATIVE 11/22/2021 0740   LEUKOCYTESUR NEGATIVE 11/22/2021 0740   Sepsis Labs: '@LABRCNTIP'$ (procalcitonin:4,lacticidven:4)  Recent Results (from the past 240 hour(s))  SARS Coronavirus 2 by RT PCR (hospital order, performed in [facility] hospital lab) *cepheid single result test* Anterior Nasal Swab     Status: None   Collection Time: 11/22/21 12:25 PM   Specimen: Anterior Nasal Swab  Result Value Ref Range Status   SARS Coronavirus 2 by RT PCR NEGATIVE NEGATIVE Final    Comment: (NOTE) SARS-CoV-2  target nucleic acids are NOT DETECTED.  The SARS-CoV-2 RNA is generally detectable in upper and lower respiratory specimens during the acute phase of infection. The lowest concentration of SARS-CoV-2 viral copies this assay can detect is 250 copies / mL. A negative result does not preclude SARS-CoV-2 infection and should not be used as the sole basis for treatment or other patient management decisions.  A negative result may occur with improper specimen collection / handling, submission of specimen other than nasopharyngeal swab, presence of viral mutation(s) within the areas targeted by this assay, and inadequate number of viral copies (<250 copies / mL). A negative result must be combined with clinical observations, patient history, and epidemiological information.  Fact Sheet for Patients:   https://www.patel.info/  Fact Sheet for Healthcare Providers: https://hall.com/  This test is not yet approved or  cleared by the [place] FDA and has been authorized for detection and/or diagnosis of SARS-CoV-2 by FDA under an Emergency Use Authorization (EUA).  This EUA will remain in effect (meaning this test can be used) for the duration of the COVID-19 declaration under Section 564(b)(1) of the Act, 21 U.S.C. section 360bbb-3(b)(1), unless the authorization is terminated or revoked sooner.  Performed at [facility], [address], [place], [place] [address]          Radiology Studies: DG Abd 1 View  Result Date: 11/22/2021 CLINICAL DATA:  Constipation.  Poor p.o. intake EXAM: ABDOMEN - 1 VIEW COMPARISON:  10/08/2018 FINDINGS: Gaseous distension of large and small bowel within the abdomen including a prominent dilated loop of bowel in the right abdomen measuring 9 cm in diameter, likely representing the cecum. There is a moderate volume of stool throughout the colon. No gross free intraperitoneal air. IMPRESSION: 1. Gaseous  distension of large and small bowel with prominent dilated loop of bowel in the right abdomen, likely representing the cecum. Findings favor colonic ileus. 2. Moderate volume of stool within the colon. Electronically Signed   By: Davina Poke D.O.   On: 11/22/2021 16:51   DG Chest Port 1 View  Result Date: 11/22/2021 CLINICAL DATA:  Decreased p.o. intake EXAM: PORTABLE CHEST 1 VIEW COMPARISON:  Chest x-ray dated November 14, 2017 FINDINGS: Cardiac and mediastinal contours are within normal limits. Elevation of the right hemidiaphragm. Calcified granulomas of the right lung, unchanged when compared to prior exam. Mild bibasilar opacities, likely due to atelectasis. No large pleural effusion or pneumothorax. Prominent gas-filled loops of bowel are seen under the bilateral hemidiaphragms. IMPRESSION: 1. Mild bibasilar opacities, likely due to atelectasis. 2. Prominent gas-filled loops of bowel are seen under the bilateral hemidiaphragms. Consider dedicated abdominal radiograph for further evaluation. Electronically Signed  By: Yetta Glassman M.D.   On: 11/22/2021 15:05            LOS: 4 days      Emeterio Reeve, DO [facility] 11/27/2021, 1:56 PM   Staff may message me via secure chat in [place]  but this may not receive immediate response,  please page for urgent matters!  If 7PM-7AM, please contact night-coverage [email or web address]  Dictation software was used to generate the above note. Typos may occur and escape review, as with typed/written notes. Please contact Dr Sheppard Coil directly for clarity if needed.

## 2021-11-28 ENCOUNTER — Ambulatory Visit: Payer: Self-pay | Admitting: *Deleted

## 2021-11-28 DIAGNOSIS — E870 Hyperosmolality and hypernatremia: Secondary | ICD-10-CM | POA: Diagnosis not present

## 2021-11-28 NOTE — TOC Progression Note (Addendum)
Transition of Care [facility]) - Progression Note    Patient Details  Name: Corey Todd MRN: [id number] Date of Birth: 1927-07-14  Transition of [facility]) CM/SW Contact  Beverly Sessions, RN Phone Number: 11/28/2021, 3:58 PM  Clinical Narrative:      SNF update - [facility] [place] - unable to offer a bed - VM left for [facility]     [phone] - unable to reach admission or leave a voicemail       Expected Discharge Plan and Services                                                 Social Determinants of Health (SDOH) Interventions    Readmission Risk Interventions     No data to display

## 2021-11-28 NOTE — Plan of Care (Signed)
?  Problem: Coping: ?Goal: Level of anxiety will decrease ?Outcome: Progressing ?  ?Problem: Safety: ?Goal: Ability to remain free from injury will improve ?Outcome: Progressing ?  ?

## 2021-11-28 NOTE — Patient Instructions (Signed)
Visit Information  Thank you for taking time to visit with me today. Please don't hesitate to contact me if I can be of assistance to you.   Following are the goals we discussed today:   Goals Addressed               This Visit's Progress     Assistance with placement (pt-stated)        Care Coordination Interventions: Follow up with patient's spouse regarding placement needs and plan of care until placements occurs Confirmed that patient remains hospitalized and care management team is working on placement for patient Per patient's spouse, family is checking on patient daily but she is delaying visits at this time to avoid upsetting patient Spouse confirms that patient's behaviors have become difficult to manage and she is needing patient to be transitioned to long term care Active listening / Reflection utilized  Emotional support provided CSW to follow up with patient's spouse to provide support needs as they arise        Our next appointment is by telephone on 8/22/3 at 11am  Please call the care guide team at [phone] if you need to cancel or reschedule your appointment.   If you are experiencing a Mental Health or [place] or need someone to talk to, please call the Suicide and Crisis Lifeline: [phone]   Patient verbalizes understanding of instructions and care plan provided today and agrees to view in [place]. Active MyChart status and patient understanding of how to access instructions and care plan via MyChart confirmed with patient.     Telephone follow up appointment with care management team member scheduled for: 12/05/21  Elliot Gurney, [place] Worker  [facility] Care Management [phone]

## 2021-11-28 NOTE — Progress Notes (Signed)
Physical Therapy Treatment Patient Details Name: Corey Todd TREAT MRN: [id number] DOB: 04/26/1927 Today's Date: 11/28/2021   History of Present Illness [name] is a 88 year old male with vascular dementia with nighttime agitation, hypothyroid, constipation, anxiety, history of sundowning, who presents to the emergency department from home for chief concerns of poor p.o. intake for 2 days.    PT Comments    Pt is making good progress towards goals. Very HOH, however participates easily. Able to ambulate increased distance in hallway using RW with cues for direction. Cues for safety including keeping RW closer to body. Inconsistent with carry over. Recommend continue to mobilize with RN staff and mobility team to decrease risk for deconditioning. Updated to recommend follow up PT at LTC as appears close to baseline level. Sitter at bedside. Will continue to progress as able.   Recommendations for follow up therapy are one component of a multi-disciplinary discharge planning process, led by the attending physician.  Recommendations may be updated based on patient status, additional functional criteria and insurance authorization.  Follow Up Recommendations  Long-term institutional care without follow-up therapy Can patient physically be transported by private vehicle: Yes   Assistance Recommended at Discharge Frequent or constant Supervision/Assistance  Patient can return home with the following A little help with walking and/or transfers;A lot of help with bathing/dressing/bathroom;Assistance with cooking/housework;Assistance with feeding;Direct supervision/assist for medications management;Direct supervision/assist for financial management;Assist for transportation;Help with stairs or ramp for entrance   Equipment Recommendations  None recommended by PT    Recommendations for Other Services       Precautions / Restrictions Precautions Precautions: Fall Restrictions Weight  Bearing Restrictions: No     Mobility  Bed Mobility Overal bed mobility: Needs Assistance Bed Mobility: Supine to Sit     Supine to sit: Min guard     General bed mobility comments: cues for initiation. HHA given as pt reached out for hand. Once seated, upright posture noted    Transfers Overall transfer level: Needs assistance Equipment used: Rolling walker (2 wheels) Transfers: Sit to/from Stand Sit to Stand: Min assist           General transfer comment: responds to gestures. RW used    Ambulation/Gait Ambulation/Gait assistance: Counsellor (Feet): 200 Feet Assistive device: Rolling walker (2 wheels) Gait Pattern/deviations: Step-through pattern       General Gait Details: keeps R LE in ER during gait. Narrow BOS, reciprocal gait pattern. Pt tends to keep RW too far away, inconsistently can bring closer to body   Stairs             Wheelchair Mobility    Modified Rankin (Stroke Patients Only)       Balance Overall balance assessment: Needs assistance Sitting-balance support: Feet supported, Bilateral upper extremity supported Sitting balance-Leahy Scale: Good     Standing balance support: Bilateral upper extremity supported Standing balance-Leahy Scale: Fair                              Cognition Arousal/Alertness: Awake/alert Behavior During Therapy: Impulsive Overall Cognitive Status: History of cognitive impairments - at baseline                                 General Comments: pleasantly confused and very HOH. Sitter at bedside throughout session        Exercises Other Exercises Other Exercises:  seated ther-ex including LAQ, alt marching and B UE shoulder flexion. 10 reps performed with heavy cues for sequencing.    General Comments        Pertinent Vitals/Pain Pain Assessment Pain Assessment: No/denies pain    Home Living                          Prior Function             PT Goals (current goals can now be found in the care plan section) Acute Rehab PT Goals Patient Stated Goal: unable to state PT Goal Formulation: Patient unable to participate in goal setting Time For Goal Achievement: 12/07/21 Potential to Achieve Goals: Good Progress towards PT goals: Progressing toward goals    Frequency    Min 2X/week      PT Plan Current plan remains appropriate    Co-evaluation              AM-PAC PT "6 Clicks" Mobility   Outcome Measure  Help needed turning from your back to your side while in a flat bed without using bedrails?: None Help needed moving from lying on your back to sitting on the side of a flat bed without using bedrails?: A Little Help needed moving to and from a bed to a chair (including a wheelchair)?: A Little Help needed standing up from a chair using your arms (e.g., wheelchair or bedside chair)?: A Little Help needed to walk in hospital room?: A Little Help needed climbing 3-5 steps with a railing? : A Lot 6 Click Score: 18    End of Session Equipment Utilized During Treatment: Gait belt Activity Tolerance: Patient tolerated treatment well Patient left: in chair (with sitter present in room) Nurse Communication: Mobility status PT Visit Diagnosis: Unsteadiness on feet (R26.81);Muscle weakness (generalized) (M62.81);Difficulty in walking, not elsewhere classified (R26.2)     Time: 3419-3790 PT Time Calculation (min) (ACUTE ONLY): 19 min  Charges:  $Gait Training: 8-22 mins                     Greggory Stallion, PT, DPT, GCS [phone]    Brodan Grewell 11/28/2021, 2:57 PM

## 2021-11-28 NOTE — Patient Outreach (Signed)
  Care Coordination   Follow Up Visit Note   11/28/2021 Name: Corey Todd MRN: [id number] DOB: 10/30/1927  Corey Todd is a 88 y.o. year old male who sees [name], [name], MD for primary care. I spoke with spouse of  Corey Todd by phone today  What matters to the patients health and wellness today?  Facility Placement    Goals Addressed               This Visit's Progress     Assistance with placement (pt-stated)        Care Coordination Interventions: Follow up with patient's spouse regarding placement needs and plan of care until placements occurs Confirmed that patient remains hospitalized and care management team is working on placement for patient Per patient's spouse, family is checking on patient daily but she is delaying visits at this time to avoid upsetting patient Spouse confirms that patient's behaviors have become difficult to manage and she is needing patient to be transitioned to long term care Active listening / Reflection utilized  Emotional support provided CSW to follow up with patient's spouse to provide support needs as they arise        SDOH assessments and interventions completed:  Yes     Care Coordination Interventions Activated:  No  Care Coordination Interventions:  Yes, provided   Follow up plan: Follow up call scheduled for 8/22/232    Encounter Outcome:  Pt. Visit Completed

## 2021-11-28 NOTE — Progress Notes (Signed)
PROGRESS NOTE    NASSIR NEIDERT   HUT:[id number] DOB: November 20, 1927  DOA: 11/22/2021 Date of Service: 11/28/21 PCP: Crecencio Mc, MD     Brief Narrative / Hospital Course:  Mr. Baldomero Mirarchi is a 88 year old male with vascular dementia with nighttime agitation, hypothyroid, constipation, anxiety, history of sundowning, who presents to the emergency department 11/22/2021 from home via EMS for chief concerns of poor p.o. intake for 1 month worse over past 2 days 08/09: sodium 151, creatinine 1.61, GFR 40, Free T4 / TSH was appropriate levels despite not on home medications per wife. COVID by PCR was negative. ED treatment: LR 500 mL bolus.  Admitted to hospitalist service for hyponatremia secondary to dehydration/poor p.o. intake.  Wife is requesting SNF placement, TOC consulted.  Synthroid restarted.  08/10: Sodium WNL at 141, potassium low at 3.0 and was repleted, creatinine improved to 1.31 with GFR 51.  Hgb dropped to 10.9 likely hemodilutional from IV fluids.  Placement pending, following potassium 08/11-08/15: No concerns on BMP/CBC on 08/11 or 08/13. Remains calm overall but requiring sitter. SNF placement pending.    Consultants:  none  Procedures: none    Subjective: Unable to obtain subjective/ROS due to dementia. Pt is resting comfortably no concerns from RN     ASSESSMENT & PLAN:   Principal Problem:   Hypernatremia Active Problems:   BPH (benign prostatic hyperplasia)   Vascular dementia with behavioral disturbance (HCC)   Constipation   Acquired hypothyroidism   Sundowning   Dehydration   Hypokalemia   AKI (acute kidney injury) ([place])   Protein calorie malnutrition (HCC)   Weakness   Pressure injury of skin   Malnutrition of moderate degree   Hypokalemia repleted and resolved Presumed secondary to poor p.o. intake, malnutrition Magnesium level was within normal limits Monitor periodic BMP  AKI (acute kidney injury) ([place]) Resolved Status post LR  500 mL bolus Recheck BMP in a few days  Hypernatremia Secondary to dehydration in setting of poor p.o. intake Resolved  Monitor periodic BMP  Protein calorie malnutrition (HCC) moderate to severe Dietary has been consulted  Constipation Resolved - large BM 08/10 Asymptomatic on evaluation and physical exam Abdominal x-ray concerning for ileus 08/10 --> adjusted bowel regimen to scheduled Synthroid restarted, constipation may also be due to subclinical hypothyroid  Dehydration Resolved  Secondary to poor PO intake IV fluids d/c Encourage po   Acquired hypothyroidism Versus sub-clinical TSH on admission was 5.287 Free T4 was within normal limits Per admitting physician's discussion with spouse over the phone, she did not know that her husband was prescribed thyroid medication and she has not given it to him in over a month, may explains why he has been more weak than baseline with poor p.o. intake in over a month, may explain constipation resumed his home hypothyroid medication however his TSH and free T4 is appropriate for his age Follow TSH outpatient  Sundowning Resumed home Seroquel 100 mg daily at 6 PM Placement/memory care pending  Vascular dementia with behavioral disturbance (HCC) Resumed Seroquel 100 mg at 6 PM daily and ordered Seroquel 50 mg nightly as needed Resumed home lorazepam 0.5 mg p.o. twice daily as needed for anxiety Placement pending for SNF/memory care  Weakness With poor p.o. intake no infection evident PT/OT as able given dementia Diet as tolerated Placement pending     DVT prophylaxis: lovenox Code Status: FULL Family Communication: none at this time  Disposition Plan / TOC needs: SNF/memory care Barriers to discharge / significant  pending items: difficult placement              Objective: Vitals:   11/27/21 1556 11/28/21 0537 11/28/21 0920 11/28/21 1139  BP: (!) 146/94 110/69 (!) 126/56 132/65  Pulse: 86 72 70 67  Resp:  '16 16 18 17  '$ Temp: 98 F (36.7 C) 97.9 F (36.6 C) 97.6 F (36.4 C) 97.6 F (36.4 C)  TempSrc:  Oral    SpO2: 96% 99%    Weight:      Height:       No intake or output data in the 24 hours ending 11/28/21 1551  Filed Weights   11/22/21 1609  Weight: 75.3 kg    Examination:  Constitutional:  VS as above General Appearance: NAD, pleasantly demented Respiratory: Normal respiratory effort CTABL Cardiovascular: S1/S2 normal No lower extremity edema Psychiatric: Poor judgment/insight Pleasant mood and affect       Scheduled Medications:   vitamin C  500 mg Oral BID   enoxaparin (LOVENOX) injection  40 mg Subcutaneous QHS   feeding supplement  237 mL Oral TID BM   levothyroxine  50 mcg Oral Q0600   lidocaine  1 Application Urethral Once   multivitamin with minerals  1 tablet Oral Daily   QUEtiapine  100 mg Oral q1800    Continuous Infusions:   PRN Medications:  LORazepam, mouth rinse, polyethylene glycol, QUEtiapine, senna-docusate  Antimicrobials:  Anti-infectives (From admission, onward)    None       Data Reviewed: I have personally reviewed following labs and imaging studies  CBC: Recent Labs  Lab 11/22/21 1225 11/23/21 0504 11/26/21 1215  WBC 10.4 8.8 9.6  NEUTROABS 6.3  --   --   HGB 13.1 10.9* 12.4*  HCT 43.2 34.6* 38.6*  MCV 90.9 89.6 86.9  PLT 304 207 767   Basic Metabolic Panel: Recent Labs  Lab 11/22/21 1225 11/23/21 0504 11/24/21 0442 11/26/21 1215  NA 151* 141 142 140  K 3.3* 3.0* 4.0 3.6  CL 122* 114* 116* 112*  CO2 '24 23 23 23  '$ GLUCOSE 93 100* 92 105*  BUN 39* 30* 24* 24*  CREATININE 1.61* 1.31* 1.21 1.14  CALCIUM 8.6* 7.5* 8.4* 8.5*  MG 2.4  --  2.1  --    GFR: Estimated Creatinine Clearance: 40 mL/min (by C-G formula based on SCr of 1.14 mg/dL). Liver Function Tests: Recent Labs  Lab 11/22/21 1225  AST 36  ALT 63*  ALKPHOS 105  BILITOT 1.1  PROT 8.1  ALBUMIN 3.4*   Recent Labs  Lab 11/22/21 1225   LIPASE 36   No results for input(s): "AMMONIA" in the last 168 hours. Coagulation Profile: No results for input(s): "INR", "PROTIME" in the last 168 hours. Cardiac Enzymes: No results for input(s): "CKTOTAL", "CKMB", "CKMBINDEX", "TROPONINI" in the last 168 hours. BNP (last 3 results) No results for input(s): "PROBNP" in the last 8760 hours. HbA1C: No results for input(s): "HGBA1C" in the last 72 hours. CBG: No results for input(s): "GLUCAP" in the last 168 hours. Lipid Profile: No results for input(s): "CHOL", "HDL", "LDLCALC", "TRIG", "CHOLHDL", "LDLDIRECT" in the last 72 hours. Thyroid Function Tests: No results for input(s): "TSH", "T4TOTAL", "FREET4", "T3FREE", "THYROIDAB" in the last 72 hours.  Anemia Panel: No results for input(s): "VITAMINB12", "FOLATE", "FERRITIN", "TIBC", "IRON", "RETICCTPCT" in the last 72 hours. Urine analysis:    Component Value Date/Time   COLORURINE AMBER (A) 11/22/2021 0740   APPEARANCEUR CLOUDY (A) 11/22/2021 0740   LABSPEC 1.024 11/22/2021 0740  PHURINE 5.0 11/22/2021 0740   GLUCOSEU NEGATIVE 11/22/2021 0740   HGBUR LARGE (A) 11/22/2021 0740   BILIRUBINUR NEGATIVE 11/22/2021 0740   KETONESUR NEGATIVE 11/22/2021 0740   PROTEINUR 30 (A) 11/22/2021 0740   NITRITE NEGATIVE 11/22/2021 0740   LEUKOCYTESUR NEGATIVE 11/22/2021 0740   Sepsis Labs: '@LABRCNTIP'$ (procalcitonin:4,lacticidven:4)  Recent Results (from the past 240 hour(s))  SARS Coronavirus 2 by RT PCR (hospital order, performed in [facility] hospital lab) *cepheid single result test* Anterior Nasal Swab     Status: None   Collection Time: 11/22/21 12:25 PM   Specimen: Anterior Nasal Swab  Result Value Ref Range Status   SARS Coronavirus 2 by RT PCR NEGATIVE NEGATIVE Final    Comment: (NOTE) SARS-CoV-2 target nucleic acids are NOT DETECTED.  The SARS-CoV-2 RNA is generally detectable in upper and lower respiratory specimens during the acute phase of infection. The  lowest concentration of SARS-CoV-2 viral copies this assay can detect is 250 copies / mL. A negative result does not preclude SARS-CoV-2 infection and should not be used as the sole basis for treatment or other patient management decisions.  A negative result may occur with improper specimen collection / handling, submission of specimen other than nasopharyngeal swab, presence of viral mutation(s) within the areas targeted by this assay, and inadequate number of viral copies (<250 copies / mL). A negative result must be combined with clinical observations, patient history, and epidemiological information.  Fact Sheet for Patients:   https://www.patel.info/  Fact Sheet for Healthcare Providers: https://hall.com/  This test is not yet approved or  cleared by the [place] FDA and has been authorized for detection and/or diagnosis of SARS-CoV-2 by FDA under an Emergency Use Authorization (EUA).  This EUA will remain in effect (meaning this test can be used) for the duration of the COVID-19 declaration under Section 564(b)(1) of the Act, 21 U.S.C. section 360bbb-3(b)(1), unless the authorization is terminated or revoked sooner.  Performed at [facility], [address] [place], [place] [address]          Radiology Studies: DG Abd 1 View  Result Date: 11/22/2021 CLINICAL DATA:  Constipation.  Poor p.o. intake EXAM: ABDOMEN - 1 VIEW COMPARISON:  10/08/2018 FINDINGS: Gaseous distension of large and small bowel within the abdomen including a prominent dilated loop of bowel in the right abdomen measuring 9 cm in diameter, likely representing the cecum. There is a moderate volume of stool throughout the colon. No gross free intraperitoneal air. IMPRESSION: 1. Gaseous distension of large and small bowel with prominent dilated loop of bowel in the right abdomen, likely representing the cecum. Findings favor colonic ileus. 2. Moderate  volume of stool within the colon. Electronically Signed   By: Davina Poke D.O.   On: 11/22/2021 16:51   DG Chest Port 1 View  Result Date: 11/22/2021 CLINICAL DATA:  Decreased p.o. intake EXAM: PORTABLE CHEST 1 VIEW COMPARISON:  Chest x-ray dated November 14, 2017 FINDINGS: Cardiac and mediastinal contours are within normal limits. Elevation of the right hemidiaphragm. Calcified granulomas of the right lung, unchanged when compared to prior exam. Mild bibasilar opacities, likely due to atelectasis. No large pleural effusion or pneumothorax. Prominent gas-filled loops of bowel are seen under the bilateral hemidiaphragms. IMPRESSION: 1. Mild bibasilar opacities, likely due to atelectasis. 2. Prominent gas-filled loops of bowel are seen under the bilateral hemidiaphragms. Consider dedicated abdominal radiograph for further evaluation. Electronically Signed   By: Yetta Glassman M.D.   On: 11/22/2021 15:05  LOS: 5 days      Emeterio Reeve, DO [facility] 11/28/2021, 3:51 PM   Staff may message me via secure chat in [place]  but this may not receive immediate response,  please page for urgent matters!  If 7PM-7AM, please contact night-coverage [email or web address]  Dictation software was used to generate the above note. Typos may occur and escape review, as with typed/written notes. Please contact Dr Sheppard Coil directly for clarity if needed.

## 2021-11-29 ENCOUNTER — Encounter: Payer: Self-pay | Admitting: *Deleted

## 2021-11-29 DIAGNOSIS — E870 Hyperosmolality and hypernatremia: Secondary | ICD-10-CM | POA: Diagnosis not present

## 2021-11-29 LAB — CBC
HCT: 38.3 % — ABNORMAL LOW (ref 39.0–52.0)
Hemoglobin: 12.3 g/dL — ABNORMAL LOW (ref 13.0–17.0)
MCH: 28.1 pg (ref 26.0–34.0)
MCHC: 32.1 g/dL (ref 30.0–36.0)
MCV: 87.6 fL (ref 80.0–100.0)
Platelets: 249 10*3/uL (ref 150–400)
RBC: 4.37 MIL/uL (ref 4.22–5.81)
RDW: 14.1 % (ref 11.5–15.5)
WBC: 10.7 10*3/uL — ABNORMAL HIGH (ref 4.0–10.5)
nRBC: 0 % (ref 0.0–0.2)

## 2021-11-29 LAB — CREATININE, SERUM
Creatinine, Ser: 1.29 mg/dL — ABNORMAL HIGH (ref 0.61–1.24)
GFR, Estimated: 52 mL/min — ABNORMAL LOW (ref >60)

## 2021-11-29 NOTE — Progress Notes (Signed)
PROGRESS NOTE    Corey FREILICH   INO:[id number] DOB: November 30, 1927  DOA: 11/22/2021 Date of Service: 11/29/21 PCP: Crecencio Mc, MD     Brief Narrative / Hospital Course:  [name] is a 88 year old male with vascular dementia with nighttime agitation, hypothyroid, constipation, anxiety, history of sundowning, who presents to the emergency department 11/22/2021 from home via EMS for chief concerns of poor p.o. intake for 1 month worse over past 2 days 08/09: sodium 151, creatinine 1.61, GFR 40, Free T4 / TSH was appropriate levels despite not on home medications per wife. COVID by PCR was negative. ED treatment: LR 500 mL bolus.  Admitted to hospitalist service for hyponatremia secondary to dehydration/poor p.o. intake.  Wife is requesting SNF placement, TOC consulted.  Synthroid restarted.  08/10: Sodium WNL at 141, potassium low at 3.0 and was repleted, creatinine improved to 1.31 with GFR 51.  Hgb dropped to 10.9 likely hemodilutional from IV fluids.  Placement pending, following potassium 08/11-08/16: No concerns on BMP/CBC on 08/11 or 08/13. Remains calm overall but requiring sitter. SNF placement pending.    Consultants:  none  Procedures: none    Subjective: Patient more alert/awake today, pleasantly demented, no concerns from RN or sitter, patient denies pain or trouble breathing,      ASSESSMENT & PLAN:   Principal Problem:   Hypernatremia Active Problems:   BPH (benign prostatic hyperplasia)   Vascular dementia with behavioral disturbance (HCC)   Constipation   Acquired hypothyroidism   Sundowning   Dehydration   Hypokalemia   AKI (acute kidney injury) ([place])   Protein calorie malnutrition ([place])   Weakness   Pressure injury of skin   Malnutrition of moderate degree   Hypokalemia repleted and resolved Presumed secondary to poor p.o. intake, malnutrition Magnesium level was within normal limits Monitor periodic BMP  AKI (acute kidney injury)  ([place]) Resolved Status post LR 500 mL bolus Recheck BMP in a few days  Hypernatremia Secondary to dehydration in setting of poor p.o. intake Resolved  Monitor periodic BMP  Protein calorie malnutrition (HCC) moderate to severe Dietary has been consulted  Constipation Resolved - large BM 08/10 Asymptomatic on evaluation and physical exam Abdominal x-ray concerning for ileus 08/10 --> adjusted bowel regimen to scheduled Synthroid restarted, constipation may also be due to subclinical hypothyroid  Dehydration Resolved  Secondary to poor PO intake IV fluids d/c Encourage po   Acquired hypothyroidism Versus sub-clinical TSH on admission was 5.287 Free T4 was within normal limits Per admitting physician's discussion with spouse over the phone, she did not know that her husband was prescribed thyroid medication and she has not given it to him in over a month, may explains why he has been more weak than baseline with poor p.o. intake in over a month, may explain constipation resumed his home hypothyroid medication however his TSH and free T4 is appropriate for his age Follow TSH outpatient  Sundowning Resumed home Seroquel 100 mg daily at 6 PM Placement/memory care pending  Vascular dementia with behavioral disturbance (HCC) Resumed Seroquel 100 mg at 6 PM daily and ordered Seroquel 50 mg nightly as needed Resumed home lorazepam 0.5 mg p.o. twice daily as needed for anxiety Placement pending for SNF/memory care  Weakness With poor p.o. intake no infection evident PT/OT as able given dementia Diet as tolerated Placement pending     DVT prophylaxis: lovenox Code Status: FULL Family Communication: none at this time  Disposition Plan / TOC needs: SNF/memory care Barriers  to discharge / significant pending items: difficult placement              Objective: Vitals:   11/28/21 1935 11/29/21 0500 11/29/21 0739 11/29/21 0754  BP: 131/88 127/80 133/69   Pulse: 90  81 (!) 50 68  Resp: '18 17 16   '$ Temp: 98.8 F (37.1 C) 98.8 F (37.1 C) 98.4 F (36.9 C)   TempSrc: Oral Oral Oral   SpO2: 98% 97% (!) 83% 97%  Weight:      Height:        Intake/Output Summary (Last 24 hours) at 11/29/2021 1253 Last data filed at 11/29/2021 7989 Gross per 24 hour  Intake 237 ml  Output 450 ml  Net -213 ml    Filed Weights   11/22/21 1609  Weight: 75.3 kg    Examination:  Constitutional:  VS as above General Appearance: NAD, pleasantly demented Respiratory: Normal respiratory effort CTABL Cardiovascular: S1/S2 normal No lower extremity edema Psychiatric: Poor judgment/insight Pleasant mood and affect       Scheduled Medications:   vitamin C  500 mg Oral BID   enoxaparin (LOVENOX) injection  40 mg Subcutaneous QHS   feeding supplement  237 mL Oral TID BM   levothyroxine  50 mcg Oral Q0600   lidocaine  1 Application Urethral Once   multivitamin with minerals  1 tablet Oral Daily   QUEtiapine  100 mg Oral q1800    Continuous Infusions:   PRN Medications:  LORazepam, mouth rinse, polyethylene glycol, QUEtiapine, senna-docusate  Antimicrobials:  Anti-infectives (From admission, onward)    None       Data Reviewed: I have personally reviewed following labs and imaging studies  CBC: Recent Labs  Lab 11/23/21 0504 11/26/21 1215 11/29/21 0458  WBC 8.8 9.6 10.7*  HGB 10.9* 12.4* 12.3*  HCT 34.6* 38.6* 38.3*  MCV 89.6 86.9 87.6  PLT 207 256 211    Basic Metabolic Panel: Recent Labs  Lab 11/23/21 0504 11/24/21 0442 11/26/21 1215 11/29/21 0458  NA 141 142 140  --   K 3.0* 4.0 3.6  --   CL 114* 116* 112*  --   CO2 '23 23 23  '$ --   GLUCOSE 100* 92 105*  --   BUN 30* 24* 24*  --   CREATININE 1.31* 1.21 1.14 1.29*  CALCIUM 7.5* 8.4* 8.5*  --   MG  --  2.1  --   --     GFR: Estimated Creatinine Clearance: 35.3 mL/min (A) (by C-G formula based on SCr of 1.29 mg/dL (H)). Liver Function Tests: No results for input(s):  "AST", "ALT", "ALKPHOS", "BILITOT", "PROT", "ALBUMIN" in the last 168 hours.  No results for input(s): "LIPASE", "AMYLASE" in the last 168 hours.  No results for input(s): "AMMONIA" in the last 168 hours. Coagulation Profile: No results for input(s): "INR", "PROTIME" in the last 168 hours. Cardiac Enzymes: No results for input(s): "CKTOTAL", "CKMB", "CKMBINDEX", "TROPONINI" in the last 168 hours. BNP (last 3 results) No results for input(s): "PROBNP" in the last 8760 hours. HbA1C: No results for input(s): "HGBA1C" in the last 72 hours. CBG: No results for input(s): "GLUCAP" in the last 168 hours. Lipid Profile: No results for input(s): "CHOL", "HDL", "LDLCALC", "TRIG", "CHOLHDL", "LDLDIRECT" in the last 72 hours. Thyroid Function Tests: No results for input(s): "TSH", "T4TOTAL", "FREET4", "T3FREE", "THYROIDAB" in the last 72 hours.  Anemia Panel: No results for input(s): "VITAMINB12", "FOLATE", "FERRITIN", "TIBC", "IRON", "RETICCTPCT" in the last 72 hours. Urine analysis:  Component Value Date/Time   COLORURINE AMBER (A) 11/22/2021 0740   APPEARANCEUR CLOUDY (A) 11/22/2021 0740   LABSPEC 1.024 11/22/2021 0740   PHURINE 5.0 11/22/2021 0740   GLUCOSEU NEGATIVE 11/22/2021 0740   HGBUR LARGE (A) 11/22/2021 0740   BILIRUBINUR NEGATIVE 11/22/2021 0740   KETONESUR NEGATIVE 11/22/2021 0740   PROTEINUR 30 (A) 11/22/2021 0740   NITRITE NEGATIVE 11/22/2021 0740   LEUKOCYTESUR NEGATIVE 11/22/2021 0740   Sepsis Labs: '@LABRCNTIP'$ (procalcitonin:4,lacticidven:4)  Recent Results (from the past 240 hour(s))  SARS Coronavirus 2 by RT PCR (hospital order, performed in [place] [facility] hospital lab) *cepheid single result test* Anterior Nasal Swab     Status: None   Collection Time: 11/22/21 12:25 PM   Specimen: Anterior Nasal Swab  Result Value Ref Range Status   SARS Coronavirus 2 by RT PCR NEGATIVE NEGATIVE Final    Comment: (NOTE) SARS-CoV-2 target nucleic acids are NOT DETECTED.  The  SARS-CoV-2 RNA is generally detectable in upper and lower respiratory specimens during the acute phase of infection. The lowest concentration of SARS-CoV-2 viral copies this assay can detect is 250 copies / mL. A negative result does not preclude SARS-CoV-2 infection and should not be used as the sole basis for treatment or other patient management decisions.  A negative result may occur with improper specimen collection / handling, submission of specimen other than nasopharyngeal swab, presence of viral mutation(s) within the areas targeted by this assay, and inadequate number of viral copies (<250 copies / mL). A negative result must be combined with clinical observations, patient history, and epidemiological information.  Fact Sheet for Patients:   https://www.patel.info/  Fact Sheet for Healthcare Providers: https://hall.com/  This test is not yet approved or  cleared by the [place] FDA and has been authorized for detection and/or diagnosis of SARS-CoV-2 by FDA under an Emergency Use Authorization (EUA).  This EUA will remain in effect (meaning this test can be used) for the duration of the COVID-19 declaration under Section 564(b)(1) of the Act, 21 U.S.C. section 360bbb-3(b)(1), unless the authorization is terminated or revoked sooner.  Performed at [facility], [address] [place], [place] [address]          Radiology Studies: DG Abd 1 View  Result Date: 11/22/2021 CLINICAL DATA:  Constipation.  Poor p.o. intake EXAM: ABDOMEN - 1 VIEW COMPARISON:  10/08/2018 FINDINGS: Gaseous distension of large and small bowel within the abdomen including a prominent dilated loop of bowel in the right abdomen measuring 9 cm in diameter, likely representing the cecum. There is a moderate volume of stool throughout the colon. No gross free intraperitoneal air. IMPRESSION: 1. Gaseous distension of large and small bowel with  prominent dilated loop of bowel in the right abdomen, likely representing the cecum. Findings favor colonic ileus. 2. Moderate volume of stool within the colon. Electronically Signed   By: Davina Poke D.O.   On: 11/22/2021 16:51   DG Chest Port 1 View  Result Date: 11/22/2021 CLINICAL DATA:  Decreased p.o. intake EXAM: PORTABLE CHEST 1 VIEW COMPARISON:  Chest x-ray dated November 14, 2017 FINDINGS: Cardiac and mediastinal contours are within normal limits. Elevation of the right hemidiaphragm. Calcified granulomas of the right lung, unchanged when compared to prior exam. Mild bibasilar opacities, likely due to atelectasis. No large pleural effusion or pneumothorax. Prominent gas-filled loops of bowel are seen under the bilateral hemidiaphragms. IMPRESSION: 1. Mild bibasilar opacities, likely due to atelectasis. 2. Prominent gas-filled loops of bowel are seen under the bilateral  hemidiaphragms. Consider dedicated abdominal radiograph for further evaluation. Electronically Signed   By: Yetta Glassman M.D.   On: 11/22/2021 15:05            LOS: 6 days      Emeterio Reeve, DO [facility] 11/29/2021, 12:53 PM   Staff may message me via secure chat in [place]  but this may not receive immediate response,  please page for urgent matters!  If 7PM-7AM, please contact night-coverage [email or web address]  Dictation software was used to generate the above note. Typos may occur and escape review, as with typed/written notes. Please contact Dr Sheppard Coil directly for clarity if needed.

## 2021-11-29 NOTE — Progress Notes (Signed)
Physical Therapy Treatment Patient Details Name: Corey Todd MRN: [id number] DOB: 12/26/1927 Today's Date: 11/29/2021   History of Present Illness [name] is a 88 year old male with vascular dementia with nighttime agitation, hypothyroid, constipation, anxiety, history of sundowning, who presents to the emergency department from home for chief concerns of poor p.o. intake for 2 days.   PT Comments    The patient was sleeping on arrival to the room. Sitter at bedside. He initially declined getting OOB but was agreeable with encouragement. He is confused but is able to follow single step commands with repetition. The patient needed a little more assistance with ambulation today than yesterday. Min A provided for steadying and rolling walker negotiation with turns. He walked 149f and required frequent cues for posture and to keep rolling walker closer to base of support. Brief carry over demonstrated before returning to flexed posture with rolling walker too far from base of support. Would recommend SNF for short tern rehab with transition to long term care at discharge. PT will continue to follow to maximize independence and decrease caregiver burden.    Recommendations for follow up therapy are one component of a multi-disciplinary discharge planning process, led by the attending physician.  Recommendations may be updated based on patient status, additional functional criteria and insurance authorization.  Follow Up Recommendations  Skilled nursing-short term rehab (<3 hours/day) Can patient physically be transported by private vehicle: Yes   Assistance Recommended at Discharge Frequent or constant Supervision/Assistance  Patient can return home with the following A little help with walking and/or transfers;A lot of help with bathing/dressing/bathroom;Assistance with cooking/housework;Assistance with feeding;Direct supervision/assist for medications management;Direct  supervision/assist for financial management;Assist for transportation;Help with stairs or ramp for entrance   Equipment Recommendations  None recommended by PT    Recommendations for Other Services       Precautions / Restrictions Precautions Precautions: Fall Restrictions Weight Bearing Restrictions: No     Mobility  Bed Mobility Overal bed mobility: Needs Assistance Bed Mobility: Supine to Sit     Supine to sit: Min assist     General bed mobility comments: assistance for trunk support. cues for task initiation and encouragement to participate    Transfers Overall transfer level: Needs assistance Equipment used: Rolling walker (2 wheels) Transfers: Sit to/from Stand Sit to Stand: Min assist           General transfer comment: verbal cues for safety and task initiation.    Ambulation/Gait Ambulation/Gait assistance: Min guard, Min assist Gait Distance (Feet): 175 Feet Assistive device: Rolling walker (2 wheels) Gait Pattern/deviations: Step-through pattern, Narrow base of support, Trunk flexed Gait velocity: decreased     General Gait Details: verbal cues to keep rolling walker closer to base of support, to increased step length, and avoid flexed trunk posture with ambulation. occasional carry over demonstrated. steadying assistance provided with occasional assistance for rolling walker negotiation, especially with turns   Stairs             Wheelchair Mobility    Modified Rankin (Stroke Patients Only)       Balance Overall balance assessment: Needs assistance Sitting-balance support: Feet supported, Bilateral upper extremity supported Sitting balance-Leahy Scale: Good     Standing balance support: Bilateral upper extremity supported Standing balance-Leahy Scale: Fair Standing balance comment: with RW support in standing                            Cognition  Arousal/Alertness: Awake/alert Behavior During Therapy:  Impulsive Overall Cognitive Status: History of cognitive impairments - at baseline                                 General Comments: patient is very HOH. he is able to follow single step commands with repetition        Exercises      General Comments        Pertinent Vitals/Pain Pain Assessment Pain Assessment: No/denies pain    Home Living                          Prior Function            PT Goals (current goals can now be found in the care plan section) Acute Rehab PT Goals Patient Stated Goal: unable to state PT Goal Formulation: Patient unable to participate in goal setting Time For Goal Achievement: 12/07/21 Potential to Achieve Goals: Good Progress towards PT goals: Progressing toward goals    Frequency    Min 2X/week      PT Plan Discharge plan needs to be updated    Co-evaluation              AM-PAC PT "6 Clicks" Mobility   Outcome Measure  Help needed turning from your back to your side while in a flat bed without using bedrails?: None Help needed moving from lying on your back to sitting on the side of a flat bed without using bedrails?: A Little Help needed moving to and from a bed to a chair (including a wheelchair)?: A Little Help needed standing up from a chair using your arms (e.g., wheelchair or bedside chair)?: A Little Help needed to walk in hospital room?: A Little Help needed climbing 3-5 steps with a railing? : Total 6 Click Score: 17    End of Session Equipment Utilized During Treatment: Gait belt Activity Tolerance: Patient tolerated treatment well Patient left: in chair;with call bell/phone within reach;with nursing/sitter in room (sitter in the room) Nurse Communication: Mobility status (discussed with 1:1 sitter) PT Visit Diagnosis: Unsteadiness on feet (R26.81);Muscle weakness (generalized) (M62.81);Difficulty in walking, not elsewhere classified (R26.2)     Time: 1020-1036 PT Time Calculation  (min) (ACUTE ONLY): 16 min  Charges:  $Gait Training: 8-22 mins                     Minna Merritts, PT, MPT    Percell Locus 11/29/2021, 12:04 PM

## 2021-11-29 NOTE — Progress Notes (Signed)
Initial Nutrition Assessment  DOCUMENTATION CODES:   Non-severe (moderate) malnutrition in context of chronic illness  INTERVENTION:   Ensure Enlive po TID, each supplement provides 350 kcal and 20 grams of protein.  Magic cup TID with meals, each supplement provides 290 kcal and 9 grams of protein  MVI po daily   Vitamin C 500mg po BID  Pt at high refeed risk; recommend monitor potassium, magnesium and phosphorus labs daily until stable  NUTRITION DIAGNOSIS:   Moderate Malnutrition related to chronic illness (dementia) as evidenced by moderate fat depletion, severe fat depletion, moderate muscle depletion, severe muscle depletion.  GOAL:   Patient will meet greater than or equal to 90% of their needs -not met   MONITOR:   PO intake, Supplement acceptance, Labs, Weight trends, Skin, I & O's  ASSESSMENT:   88 y/o male with h/o dementia, anxiety, hypothyroidism, BPH, HOH, chronic constipation, GERD, HTN, IDA, DVT and hernia repair who is admitted with FTT.  Pt with poor appetite and oral intake in hospital. Pt eating <25% of most meals but pt did eat 100% of his breakfast today that included raisin bran, yogurt and milk. Pt is drinking some Ensure supplements. Pt is getting Magic Cups on trays. Pt remains at refeed risk. No new weight since admit; will request weekly weights. Pt pending SNF placement.   Medications reviewed and include: vitamin C, lovenox, synthroid, MVI  Labs reviewed: creat 1.29(H) Wbc- 10.7(H), Hgb 12.3(L), Hct 38.3(L)  Diet Order:   Diet Order             Diet regular Room service appropriate? Yes; Fluid consistency: Thin  Diet effective now                  EDUCATION NEEDS:   Not appropriate for education at this time  Skin:  Skin Assessment: Reviewed RN Assessment (Stage I left and right buttocks, skin tear arm)  Last BM:  8/16- type 7  Height:   Ht Readings from Last 1 Encounters:  11/22/21 5' 8" (1.727 m)    Weight:   Wt  Readings from Last 1 Encounters:  11/22/21 75.3 kg    Ideal Body Weight:  70 kg  BMI:  Body mass index is 25.26 kg/m.  Estimated Nutritional Needs:   Kcal:  1700-1900kcal/day  Protein:  85-95g/day  Fluid:  1.8-2.1L/day    MS, RD, LDN Please refer to [facility]ION for RD and/or RD on-call/weekend/after hours pager 

## 2021-11-29 NOTE — Plan of Care (Signed)
  Problem: Clinical Measurements: Goal: Diagnostic test results will improve Outcome: Not Progressing   Problem: Safety: Goal: Ability to remain free from injury will improve Outcome: Not Progressing   

## 2021-11-29 NOTE — Plan of Care (Signed)
  Problem: Activity: Goal: Risk for activity intolerance will decrease Outcome: Progressing   Problem: Coping: Goal: Level of anxiety will decrease Outcome: Progressing   Problem: Elimination: Goal: Will not experience complications related to bowel motility Outcome: Progressing Goal: Will not experience complications related to urinary retention Outcome: Progressing   Problem: Pain Managment: Goal: General experience of comfort will improve Outcome: Progressing   

## 2021-11-29 NOTE — TOC Progression Note (Addendum)
Transition of Care [facility]) - Progression Note    Patient Details  Name: Corey Todd MRN: [id number] Date of Birth: [birth year]  Transition of Care [facility] CM/SW Contact  Beverly Sessions, RN Phone Number: 11/29/2021, 2:51 PM  Clinical Narrative:     - [facility] does not have a bed available. She said to check back next Tuesday -  [place] [phone].  States she did not receive the  referral. Requested that it be faxed to [phone].  Clinical faxed  - [place] Admission coordinator in meeting.  VM left       Expected Discharge Plan and Services                                                 Social Determinants of Health (SDOH) Interventions    Readmission Risk Interventions     No data to display

## 2021-11-30 DIAGNOSIS — E039 Hypothyroidism, unspecified: Secondary | ICD-10-CM

## 2021-11-30 DIAGNOSIS — E870 Hyperosmolality and hypernatremia: Secondary | ICD-10-CM | POA: Diagnosis not present

## 2021-11-30 DIAGNOSIS — E440 Moderate protein-calorie malnutrition: Secondary | ICD-10-CM

## 2021-11-30 DIAGNOSIS — N179 Acute kidney failure, unspecified: Secondary | ICD-10-CM

## 2021-11-30 LAB — CBC
HCT: 35.2 % — ABNORMAL LOW (ref 39.0–52.0)
Hemoglobin: 11.3 g/dL — ABNORMAL LOW (ref 13.0–17.0)
MCH: 28.2 pg (ref 26.0–34.0)
MCHC: 32.1 g/dL (ref 30.0–36.0)
MCV: 87.8 fL (ref 80.0–100.0)
Platelets: 245 10*3/uL (ref 150–400)
RBC: 4.01 MIL/uL — ABNORMAL LOW (ref 4.22–5.81)
RDW: 14.1 % (ref 11.5–15.5)
WBC: 10 10*3/uL (ref 4.0–10.5)
nRBC: 0 % (ref 0.0–0.2)

## 2021-11-30 LAB — BASIC METABOLIC PANEL
Anion gap: 4 — ABNORMAL LOW (ref 5–15)
BUN: 28 mg/dL — ABNORMAL HIGH (ref 8–23)
CO2: 24 mmol/L (ref 22–32)
Calcium: 7.8 mg/dL — ABNORMAL LOW (ref 8.9–10.3)
Chloride: 110 mmol/L (ref 98–111)
Creatinine, Ser: 1.19 mg/dL (ref 0.61–1.24)
GFR, Estimated: 57 mL/min — ABNORMAL LOW (ref >60)
Glucose, Bld: 98 mg/dL (ref 70–99)
Potassium: 2.9 mmol/L — ABNORMAL LOW (ref 3.5–5.1)
Sodium: 138 mmol/L (ref 135–145)

## 2021-11-30 LAB — MAGNESIUM: Magnesium: 2.3 mg/dL (ref 1.7–2.4)

## 2021-11-30 MED ORDER — POTASSIUM CHLORIDE CRYS ER 20 MEQ PO TBCR
40.0000 meq | EXTENDED_RELEASE_TABLET | Freq: Once | ORAL | Status: AC
  Administered 2021-11-30: 40 meq via ORAL
  Filled 2021-11-30: qty 2

## 2021-11-30 NOTE — Plan of Care (Signed)
  Problem: Safety: Goal: Ability to remain free from injury will improve Outcome: Not Progressing   Problem: Nutrition: Goal: Adequate nutrition will be maintained Outcome: Not Progressing   

## 2021-11-30 NOTE — Progress Notes (Signed)
Progress Note   Patient: Corey Todd PQZ:[id number] DOB: [birth year] DOA: 11/22/2021     7 DOS: the patient was seen and examined on 11/30/2021   Brief hospital course: [name] is a 88 year old male with vascular dementia with nighttime agitation, hypothyroid, constipation, anxiety, history of sundowning, who presents to the emergency department 11/22/2021 from home via EMS for chief concerns of poor p.o. intake for 1 month worse over past 2 days . 08/09: sodium 151, creatinine 1.61, GFR 40, Free T4 / TSH was appropriate levels despite not on home medications per wife. COVID by PCR was negative. ED treatment: LR 500 mL bolus.  Admitted to hospitalist service for hyponatremia secondary to dehydration/poor p.o. intake.  Wife is requesting SNF placement, TOC consulted.  Synthroid restarted.  Marland Kitchen 08/10: Sodium WNL at 141, potassium low at 3.0 and was repleted, creatinine improved to 1.31 with GFR 51.  Hgb dropped to 10.9 likely hemodilutional from IV fluids.  Placement pending, following potassium . 08/11-08/16: SNF placement pending.  . 8/17: Palliative care consult, pending SNF   Assessment and Plan: * Hypernatremia Secondary to dehydration in setting of poor p.o. intake Resolved   Weakness  Multifactorial  PT/OT recommends SNF.  TOC working on placement  Protein-calorie malnutrition, moderate ([place])  Complicates overall prognosis  AKI (acute kidney injury) ([place]) Resolved Status post LR 500 mL bolus  Recheck BMP tomorrow  Hypokalemia  Replete and recheck  Dehydration Resolved  Secondary to poor PO intake  Encourage po   Sundowning  Resumed home Seroquel 100 mg daily at 6 PM  Placement/memory care pending.  Acquired hypothyroidism  Versus sub-clinical  TSH on admission was 5.287 Free T4 was within normal limits  Per admitting physician's discussion with spouse over the phone, she did not know that her husband was prescribed thyroid medication and she has  not given it to him in over a month, may explains why he has been more weak than baseline with poor p.o. intake in over a month, may explain constipation  resumed his home hypothyroid medication however his TSH and free T4 is appropriate for his age  Follow TSH outpatient.  Constipation Resolved - large BM 08/10  Asymptomatic on evaluation and physical exam  Abdominal x-ray concerning for ileus 08/10 --> adjusted bowel regimen to scheduled  Synthroid restarted, constipation may also be due to subclinical hypothyroid.  Vascular dementia with behavioral disturbance (HCC)  Resumed Seroquel 100 mg at 6 PM daily and ordered Seroquel 50 mg nightly as needed  Resumed home lorazepam 0.5 mg p.o. twice daily as needed for anxiety  Placement pending for SNF/memory care.        Subjective: Sleeping comfortably.  Denies any new issues  Physical Exam: Vitals:   11/29/21 1801 11/29/21 1912 11/30/21 0428 11/30/21 0721  BP:  110/72 (!) 106/48 (!) 112/55  Pulse:  85 68 71  Resp:  '16 18 16  '$ Temp:  97.6 F (36.4 C)  97.9 F (36.6 C)  TempSrc:    Oral  SpO2:  98% 99% 95%  Weight: 74.2 kg     Height:       88 year old male sleeping comfortably without any acute distress Lungs clear to auscultation bilaterally Cardiovascular regular rate and rhythm Abdomen soft, benign Neuro nonfocal, pleasantly confused likely from his baseline dementia Data Reviewed:  Potassium 2.9  Family Communication: None  Disposition: Status is: Inpatient Remains inpatient appropriate because: Waiting for SNF placement.  Palliative care consult pending   Planned Discharge Destination: Skilled  nursing facility    DVT plexus-Lovenox Time spent: 25 minutes  Author: Max Sane, MD 11/30/2021 6:08 PM  For on call review [email or web address].

## 2021-12-01 DIAGNOSIS — N179 Acute kidney failure, unspecified: Secondary | ICD-10-CM | POA: Diagnosis not present

## 2021-12-01 DIAGNOSIS — R531 Weakness: Secondary | ICD-10-CM

## 2021-12-01 DIAGNOSIS — E440 Moderate protein-calorie malnutrition: Secondary | ICD-10-CM

## 2021-12-01 DIAGNOSIS — F01518 Vascular dementia, unspecified severity, with other behavioral disturbance: Secondary | ICD-10-CM | POA: Diagnosis not present

## 2021-12-01 DIAGNOSIS — Z66 Do not resuscitate: Secondary | ICD-10-CM

## 2021-12-01 DIAGNOSIS — E039 Hypothyroidism, unspecified: Secondary | ICD-10-CM

## 2021-12-01 DIAGNOSIS — E870 Hyperosmolality and hypernatremia: Principal | ICD-10-CM

## 2021-12-01 DIAGNOSIS — K5900 Constipation, unspecified: Secondary | ICD-10-CM | POA: Diagnosis not present

## 2021-12-01 DIAGNOSIS — Z515 Encounter for palliative care: Secondary | ICD-10-CM

## 2021-12-01 DIAGNOSIS — E860 Dehydration: Secondary | ICD-10-CM

## 2021-12-01 DIAGNOSIS — F05 Delirium due to known physiological condition: Secondary | ICD-10-CM

## 2021-12-01 LAB — CBC
HCT: 37.2 % — ABNORMAL LOW (ref 39.0–52.0)
Hemoglobin: 11.9 g/dL — ABNORMAL LOW (ref 13.0–17.0)
MCH: 27.5 pg (ref 26.0–34.0)
MCHC: 32 g/dL (ref 30.0–36.0)
MCV: 86.1 fL (ref 80.0–100.0)
Platelets: 258 10*3/uL (ref 150–400)
RBC: 4.32 MIL/uL (ref 4.22–5.81)
RDW: 14.2 % (ref 11.5–15.5)
WBC: 8.7 10*3/uL (ref 4.0–10.5)
nRBC: 0 % (ref 0.0–0.2)

## 2021-12-01 LAB — BASIC METABOLIC PANEL
Anion gap: 4 — ABNORMAL LOW (ref 5–15)
BUN: 23 mg/dL (ref 8–23)
CO2: 24 mmol/L (ref 22–32)
Calcium: 8.4 mg/dL — ABNORMAL LOW (ref 8.9–10.3)
Chloride: 113 mmol/L — ABNORMAL HIGH (ref 98–111)
Creatinine, Ser: 1.03 mg/dL (ref 0.61–1.24)
GFR, Estimated: >60 mL/min (ref >60)
Glucose, Bld: 92 mg/dL (ref 70–99)
Potassium: 3 mmol/L — ABNORMAL LOW (ref 3.5–5.1)
Sodium: 141 mmol/L (ref 135–145)

## 2021-12-01 MED ORDER — POTASSIUM CHLORIDE CRYS ER 20 MEQ PO TBCR
40.0000 meq | EXTENDED_RELEASE_TABLET | Freq: Two times a day (BID) | ORAL | Status: AC
Start: 2021-12-01 — End: 2021-12-01
  Administered 2021-12-01 (×2): 40 meq via ORAL
  Filled 2021-12-01 (×2): qty 2

## 2021-12-01 MED ORDER — POTASSIUM CHLORIDE CRYS ER 20 MEQ PO TBCR
40.0000 meq | EXTENDED_RELEASE_TABLET | Freq: Once | ORAL | Status: AC
  Administered 2021-12-01: 40 meq via ORAL
  Filled 2021-12-01: qty 2

## 2021-12-01 NOTE — TOC Progression Note (Signed)
Transition of Care [facility]) - Progression Note    Patient Details  Name: Corey Todd MRN: [id number] Date of Birth: 1927-11-02  Transition of Care [facility]) CM/SW [place], LCSW Phone Number: 12/01/2021, 1:18 PM  Clinical Narrative:    [facility]- Left VM for Mechele Claude at [phone] requesting return call.  [place] to reach anyone at [phone] or [phone]. [place]- Attempted call to [place] at [phone]. Unable to leave a VM. [place]- Attempted to reach Admissions at [phone] . Left VM requesting return call.          Expected Discharge Plan and Services                                                 Social Determinants of Health (SDOH) Interventions    Readmission Risk Interventions     No data to display

## 2021-12-01 NOTE — Assessment & Plan Note (Signed)
   Versus sub-clinical  TSH on admission was 5.287 Free T4 was within normal limits  Per admitting physician's discussion with spouse over the phone, she did not know that her husband was prescribed thyroid medication and she has not given it to him in over a month, may explains why he has been more weak than baseline with poor p.o. intake in over a month, may explain constipation  resumed his home hypothyroid medication however his TSH and free T4 is appropriate for his age  Follow TSH outpatient

## 2021-12-01 NOTE — Progress Notes (Addendum)
Progress Note   Patient: Corey Todd:[id number] DOB: 1928/03/12 DOA: 11/22/2021     8 DOS: the patient was seen and examined on 12/01/2021   Brief hospital course: [name] is a 88 year old male with vascular dementia with nighttime agitation, hypothyroid, constipation, anxiety, history of sundowning, who presents to the emergency department 11/22/2021 from home via EMS for chief concerns of poor p.o. intake for 1 month worse over past 2 days 08/09: sodium 151, creatinine 1.61, GFR 40, Free T4 / TSH was appropriate levels despite not on home medications per wife. COVID by PCR was negative. ED treatment: LR 500 mL bolus.  Admitted to hospitalist service for hyponatremia secondary to dehydration/poor p.o. intake.  Wife is requesting SNF placement, TOC consulted.  Synthroid restarted.  08/10: Sodium WNL at 141, potassium low at 3.0 and was repleted, creatinine improved to 1.31 with GFR 51.  Hgb dropped to 10.9 likely hemodilutional from IV fluids.  Placement pending, following potassium 08/11-08/16: SNF placement pending.  8/17: Palliative care consult, pending SNF   Assessment and Plan: * Hypernatremia Secondary to dehydration in setting of poor p.o. intake Resolved .  Weakness Multifactorial PT/OT recommends SNF.  TOC working on placement.  Protein-calorie malnutrition, moderate ([place]) Complicates overall prognosis.  AKI (acute kidney injury) ([place]) Resolved   Hypokalemia Repleted  Dehydration Resolved  Secondary to poor PO intake Encourage po  Sundowning Resumed home Seroquel 100 mg daily at 6 PM Placement/memory care pending. Safety sitter in place  Acquired hypothyroidism Versus sub-clinical TSH on admission was 5.287 Free T4 was within normal limits Per admitting physician's discussion with spouse over the phone, she did not know that her husband was prescribed thyroid medication and she has not given it to him in over a month, may explains why he has  been more weak than baseline with poor p.o. intake in over a month, may explain constipation resumed his home hypothyroid medication however his TSH and free T4 is appropriate for his age Follow TSH outpatient  Constipation Resolved - large BM 08/10 Asymptomatic on evaluation and physical exam Abdominal x-ray concerning for ileus 08/10 --> adjusted bowel regimen to scheduled Synthroid restarted, constipation may also be due to subclinical hypothyroid  Vascular dementia with behavioral disturbance (HCC) Resumed Seroquel 100 mg at 6 PM daily and ordered Seroquel 50 mg nightly as needed Resumed home lorazepam 0.5 mg p.o. twice daily as needed for anxiety Placement pending for SNF/memory care        Subjective: No new issues, pleasantly confused.  Safety sitter at bedside  Physical Exam: Vitals:   11/30/21 0721 11/30/21 1933 12/01/21 0548 12/01/21 0755  BP: (!) 112/55 126/64 (!) 97/54 132/62  Pulse: 71 85 66 73  Resp: '16 16 16 17  '$ Temp: 97.9 F (36.6 C) 97.7 F (36.5 C) 98.1 F (36.7 C) 97.7 F (36.5 C)  TempSrc: Oral Oral Oral Oral  SpO2: 95% 97% 100% 97%  Weight:      Height:       88 year old male sleeping comfortably without any acute distress Lungs clear to auscultation bilaterally Cardiovascular regular rate and rhythm Abdomen soft, benign Neuro nonfocal, pleasantly confused likely at baseline, due to dementia Data Reviewed:  Potassium 3.0  Family Communication: Updated patient's son Corey Todd over phone  Disposition: Status is: Inpatient Remains inpatient appropriate because: Waiting for SNF placement, managing electrolytes   Planned Discharge Destination: Skilled nursing facility    DVT prophylaxis-Lovenox Time spent: 35 minutes  Author: Max Sane, MD 12/01/2021 3:21 PM  For on call review [email or web address].

## 2021-12-01 NOTE — Assessment & Plan Note (Signed)
   Resumed Seroquel 100 mg at 6 PM daily and ordered Seroquel 50 mg nightly as needed  Resumed home lorazepam 0.5 mg p.o. twice daily as needed for anxiety  Placement pending for SNF/memory care

## 2021-12-01 NOTE — Assessment & Plan Note (Signed)
Secondary to dehydration in setting of poor p.o. intake Resolved .

## 2021-12-01 NOTE — Assessment & Plan Note (Signed)
Resolved

## 2021-12-01 NOTE — Assessment & Plan Note (Signed)
Repleted. °

## 2021-12-01 NOTE — Assessment & Plan Note (Signed)
   Resumed home Seroquel 100 mg daily at 6 PM  Placement/memory care pending.  Safety sitter in place

## 2021-12-01 NOTE — Consult Note (Signed)
Consultation Note Date: 12/01/2021   Patient Name: Corey Todd  DOB: 1928/02/22  MRN: [id number]  Age / Sex: 88 y.o., male  PCP: Crecencio Mc, MD Referring Physician: Max Sane, MD  Reason for Consultation: Establishing goals of care  HPI/Patient Profile: 88 y.o. male  with past medical history of hypothyroidism, constipation, anxiety, and vascular dementia (agitation/sundowning at bedtime) admitted on 11/22/2021 with poor p.o. intake over the last month.  Patient is being treated for hypernatremia.  Patient is pending SNF placement for memory care unit as patient's wife is unable to provide adequate care for patient at home.  Palliative medicine team was consulted to discuss goals of care..   Clinical Assessment and Goals of Care: I have reviewed medical records including EPIC notes, labs and imaging, assessed the patient, who is unable to participate in [place] discussions. I then spoke with patient's wife [name] over the phone to discuss diagnosis prognosis, [place], EOL wishes, disposition and options.  I introduced Palliative Medicine as specialized medical care for people living with serious illness. It focuses on providing relief from the symptoms and stress of a serious illness. The goal is to improve quality of life for both the patient and the family.  We discussed a brief life review of the patient. 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 [name] shares that Corey Todd 'had more friends than you could ever imagine. He was friends with everyone."  As far as functional and nutritional status [name] shares the patient became too much of a physical burden to be able to appropriately care for him at home. She says this is one of the hardest things she has ever done because she wants to be with him but  knows she can no longer take care of him.   We discussed patient's current illness and what it means in the larger context of patient's on-going co-morbidities.  Natural disease trajectory and expectations at EOL were discussed. Progressive and irreversible nature of dementia briefly discussed. [name] was appropriately tearful.  Therapeutic silence and active listening provided for her to share thoughts and emotions regarding her husband's current medical situation.  I attempted to elicit values and goals of care important to the patient.  Cassandria Santee wants patient to be placed somewhere where he will get the right care and she wants to be able to visit with him.  Education offered regarding concept specific to human mortality and the limitations of medical interventions to prolong life when the body begins to fail to thrive.  Family is facing treatment option decisions, advanced directive, and anticipatory care needs.  [name] is awaiting bed offers for SNF with memory care.   Discussed with [name] the importance of continued conversation with family and the medical providers regarding overall plan of care and treatment options, ensuring decisions are within the context of the patient's values and GOCs.  I encouraged her to speak with the nursing staff  whenever she would like an update as she is unable to physically patient.  Questions and concerns were addressed.  [name] was encouraged to call with questions or concerns.  PMT contact information given.  She was made aware there is no in person coverage at [facility] for the palliative medicine team over the weekend.  PMT will follow-up with patient and family next week to continue [place] discussions.  Primary Decision Maker NEXT OF KIN  Code Status/Advance Care Planning: DNR  Prognosis:   Unable to determine  Discharge Planning: To Be Determined  Primary Diagnoses: Present on Admission:  Dehydration  Hypokalemia  Vascular dementia with behavioral  disturbance (HCC)  Sundowning  BPH (benign prostatic hyperplasia)  AKI (acute kidney injury) ([place])  Hypernatremia  Protein-calorie malnutrition, moderate (HCC)  Constipation  Acquired hypothyroidism   Physical Exam Vitals reviewed.  Constitutional:      General: He is not in acute distress.    Appearance: He is not ill-appearing.  HENT:     Head: Normocephalic.     Mouth/Throat:     Mouth: Mucous membranes are moist.  Eyes:     Pupils: Pupils are equal, round, and reactive to light.  Cardiovascular:     Rate and Rhythm: Normal rate.     Pulses: Normal pulses.  Pulmonary:     Effort: Pulmonary effort is normal.  Abdominal:     Palpations: Abdomen is soft.  Musculoskeletal:     Comments: Generalized weakness  Skin:    Findings: Bruising present.  Neurological:     Mental Status: He is alert.     Comments: Oriented to self, pleasant, cooperative, not combative  Psychiatric:        Mood and Affect: Mood normal.        Behavior: Behavior normal.     Palliative Assessment/Data: 40%     Thank you for this consult. Palliative medicine will continue to follow and assist holistically.   Time Total: 75 minutes Greater than 50%  of this time was spent counseling and coordinating care related to the above assessment and plan.  Signed by: Jordan Hawks, DNP, FNP-BC Palliative Medicine    Please contact Palliative Medicine Team phone at [phone] for questions and concerns.  For individual provider: See Shea Evans

## 2021-12-01 NOTE — Assessment & Plan Note (Signed)
Resolved  Secondary to poor PO intake  Encourage po

## 2021-12-01 NOTE — Assessment & Plan Note (Signed)
Complicates overall prognosis. 

## 2021-12-01 NOTE — Assessment & Plan Note (Signed)
Resolved - large BM 08/10  Asymptomatic on evaluation and physical exam  Abdominal x-ray concerning for ileus 08/10 --> adjusted bowel regimen to scheduled  Synthroid restarted, constipation may also be due to subclinical hypothyroid

## 2021-12-01 NOTE — Assessment & Plan Note (Signed)
   Multifactorial  PT/OT recommends SNF.  TOC working on placement.

## 2021-12-01 NOTE — Progress Notes (Signed)
Physical Therapy Treatment Patient Details Name: ZEPHANIAH LUBRANO MRN: [id number] DOB: [birth year] Today's Date: 12/01/2021   History of Present Illness Mr. Mylz Yuan is a 88 year old male with vascular dementia with nighttime agitation, hypothyroid, constipation, anxiety, history of sundowning, who presents to the emergency department from home for chief concerns of poor p.o. intake for 2 days.    PT Comments    The patient is agreeable to PT treatment. He is confused but cooperative and able to follow single step commands inconsistently with repetition and increased time. He required assistance for bed mobility, transfers, and ambulation. Safety cues required with mobility. He ambulated 17f in hallway with rolling walker. Patient is fatigued with activity with limited overall endurance in standing. Recommend to continue PT to maximize independence and decrease caregiver burden. SNF is recommended at discharge.    Recommendations for follow up therapy are one component of a multi-disciplinary discharge planning process, led by the attending physician.  Recommendations may be updated based on patient status, additional functional criteria and insurance authorization.  Follow Up Recommendations  Skilled nursing-short term rehab (<3 hours/day) Can patient physically be transported by private vehicle: Yes   Assistance Recommended at Discharge Frequent or constant Supervision/Assistance  Patient can return home with the following A little help with walking and/or transfers;A lot of help with bathing/dressing/bathroom;Assistance with cooking/housework;Assistance with feeding;Direct supervision/assist for medications management;Direct supervision/assist for financial management;Assist for transportation;Help with stairs or ramp for entrance   Equipment Recommendations  None recommended by PT    Recommendations for Other Services       Precautions / Restrictions Precautions Precautions:  Fall Restrictions Weight Bearing Restrictions: No     Mobility  Bed Mobility Overal bed mobility: Needs Assistance Bed Mobility: Supine to Sit, Sit to Supine     Supine to sit: Min assist Sit to supine: Min assist   General bed mobility comments: assistance for trunk support to sit upright. assistance for intermittent LE support to return to bed. verbal cues for sequencing and technique    Transfers Overall transfer level: Needs assistance Equipment used: Rolling walker (2 wheels) Transfers: Sit to/from Stand Sit to Stand: Min assist           General transfer comment: verbal cues for hand placement. intermittent lifting assistance provided    Ambulation/Gait Ambulation/Gait assistance: Min guard, Min assist Gait Distance (Feet): 120 Feet Assistive device: Rolling walker (2 wheels) Gait Pattern/deviations: Step-through pattern, Decreased stride length, Trunk flexed Gait velocity: decreased     General Gait Details: frequent verbal cues to stand closer to rolling walker for support and cues for erect posture as patient has flexed trunk with ambulation. he demonstrated brief carry over but has increased trunk flexion with fatigue. minimal assistance for steadying and for for assistance with turning rolling walker. navigational cues also required due to confusion   Stairs             Wheelchair Mobility    Modified Rankin (Stroke Patients Only)       Balance Overall balance assessment: Needs assistance Sitting-balance support: Feet supported Sitting balance-Leahy Scale: Good     Standing balance support: Bilateral upper extremity supported Standing balance-Leahy Scale: Fair Standing balance comment: with RW support in standing                            Cognition Arousal/Alertness: Awake/alert Behavior During Therapy: Impulsive Overall Cognitive Status: History of cognitive impairments - at baseline Area of Impairment:  Orientation,  Attention, Memory, Following commands, Safety/judgement, Problem solving, Awareness                 Orientation Level: Disoriented to, Place, Time, Situation Current Attention Level: Alternating Memory: Decreased recall of precautions, Decreased short-term memory Following Commands: Follows one step commands inconsistently (with repetition) Safety/Judgement: Decreased awareness of safety   Problem Solving: Difficulty sequencing, Slow processing, Requires verbal cues, Requires tactile cues General Comments: HOH        Exercises General Exercises - Lower Extremity Ankle Circles/Pumps: AAROM, Strengthening, Both, 10 reps, Supine Short Arc Quad: AAROM, Strengthening, Both, 10 reps, Supine Hip ABduction/ADduction: AAROM, Strengthening, Both, 10 reps, Supine Other Exercises Other Exercises: verbal and visual cues for exercise technique for strengthening    General Comments        Pertinent Vitals/Pain Pain Assessment Pain Assessment: No/denies pain    Home Living                          Prior Function            PT Goals (current goals can now be found in the care plan section) Acute Rehab PT Goals Patient Stated Goal: to get a razor and shave PT Goal Formulation: With patient Time For Goal Achievement: 12/07/21 Potential to Achieve Goals: Good Progress towards PT goals: Progressing toward goals    Frequency    Min 2X/week      PT Plan Discharge plan needs to be updated    Co-evaluation              AM-PAC PT "6 Clicks" Mobility   Outcome Measure  Help needed turning from your back to your side while in a flat bed without using bedrails?: None Help needed moving from lying on your back to sitting on the side of a flat bed without using bedrails?: A Little Help needed moving to and from a bed to a chair (including a wheelchair)?: A Little Help needed standing up from a chair using your arms (e.g., wheelchair or bedside chair)?: A  Little Help needed to walk in hospital room?: A Little Help needed climbing 3-5 steps with a railing? : Total 6 Click Score: 17    End of Session Equipment Utilized During Treatment: Gait belt Activity Tolerance: Patient tolerated treatment well Patient left: in bed;with call bell/phone within reach;with bed alarm set Nurse Communication:  (discussed with 1:1 sitter in the room) PT Visit Diagnosis: Unsteadiness on feet (R26.81);Muscle weakness (generalized) (M62.81);Difficulty in walking, not elsewhere classified (R26.2)     Time: 0539-7673 PT Time Calculation (min) (ACUTE ONLY): 23 min  Charges:  $Gait Training: 8-22 mins $Therapeutic Activity: 8-22 mins                    Minna Merritts, PT, MPT    Percell Locus 12/01/2021, 10:56 AM

## 2021-12-02 DIAGNOSIS — E870 Hyperosmolality and hypernatremia: Secondary | ICD-10-CM | POA: Diagnosis not present

## 2021-12-02 LAB — BASIC METABOLIC PANEL
Anion gap: 4 — ABNORMAL LOW (ref 5–15)
BUN: 26 mg/dL — ABNORMAL HIGH (ref 8–23)
CO2: 21 mmol/L — ABNORMAL LOW (ref 22–32)
Calcium: 8.2 mg/dL — ABNORMAL LOW (ref 8.9–10.3)
Chloride: 117 mmol/L — ABNORMAL HIGH (ref 98–111)
Creatinine, Ser: 1.14 mg/dL (ref 0.61–1.24)
GFR, Estimated: >60 mL/min (ref >60)
Glucose, Bld: 96 mg/dL (ref 70–99)
Potassium: 3.7 mmol/L (ref 3.5–5.1)
Sodium: 142 mmol/L (ref 135–145)

## 2021-12-02 LAB — CBC
HCT: 37.7 % — ABNORMAL LOW (ref 39.0–52.0)
Hemoglobin: 12.1 g/dL — ABNORMAL LOW (ref 13.0–17.0)
MCH: 28 pg (ref 26.0–34.0)
MCHC: 32.1 g/dL (ref 30.0–36.0)
MCV: 87.3 fL (ref 80.0–100.0)
Platelets: 272 10*3/uL (ref 150–400)
RBC: 4.32 MIL/uL (ref 4.22–5.81)
RDW: 14.6 % (ref 11.5–15.5)
WBC: 9.7 10*3/uL (ref 4.0–10.5)
nRBC: 0 % (ref 0.0–0.2)

## 2021-12-02 NOTE — Progress Notes (Signed)
PROGRESS NOTE    Corey Todd  XBJ:[id number] DOB: 1927/11/05 DOA: 11/22/2021 PCP: Crecencio Mc, MD   Brief Narrative:  [name] is a 88 year old male with vascular dementia with nighttime agitation, hypothyroid, constipation, anxiety, history of sundowning, who presents to the emergency department 11/22/2021 from home via EMS for chief concerns of poor p.o. intake for 1 month worse over past 2 days 08/09: sodium 151, creatinine 1.61, GFR 40, Free T4 / TSH was appropriate levels despite not on home medications per wife. COVID by PCR was negative. ED treatment: LR 500 mL bolus.  Admitted to hospitalist service for hyponatremia secondary to dehydration/poor p.o. intake.  Wife is requesting SNF placement, TOC consulted.  Synthroid restarted.  08/10: Sodium WNL at 141, potassium low at 3.0 and was repleted, creatinine improved to 1.31 with GFR 51.  Hgb dropped to 10.9 likely hemodilutional from IV fluids.  Placement pending, following potassium 08/11-08/16: SNF placement pending.  8/17-8/19: Palliative care consult, pending SNF placement     Assessment & Plan:   Principal Problem:   Hypernatremia Active Problems:   BPH (benign prostatic hyperplasia)   Vascular dementia with behavioral disturbance (HCC)   Constipation   Acquired hypothyroidism   Sundowning   Dehydration   Hypokalemia   AKI (acute kidney injury) ([place])   Protein-calorie malnutrition, moderate (HCC)   Weakness   Pressure injury of skin  Assessment and Plan: * Hypernatremia Secondary to dehydration in setting of poor p.o. intake Resolved .  Weakness Multifactorial PT/OT recommends SNF.  TOC working on placement.  Protein-calorie malnutrition, moderate ([facility]) Complicates overall prognosis.  AKI (acute kidney injury) ([place]) Resolved   Hypokalemia Repleted  Dehydration Resolved  Secondary to poor PO intake Encourage po  Sundowning Resumed home Seroquel 100 mg daily at 6 PM Placement/memory  care pending. Safety sitter in place  Acquired hypothyroidism Versus sub-clinical TSH on admission was 5.287 Free T4 was within normal limits Per admitting physician's discussion with spouse over the phone, she did not know that her husband was prescribed thyroid medication and she has not given it to him in over a month, may explains why he has been more weak than baseline with poor p.o. intake in over a month, may explain constipation resumed his home hypothyroid medication however his TSH and free T4 is appropriate for his age Follow TSH outpatient  Constipation Resolved - large BM 08/10 Asymptomatic on evaluation and physical exam Abdominal x-ray concerning for ileus 08/10 --> adjusted bowel regimen to scheduled Synthroid restarted, constipation may also be due to subclinical hypothyroid  Vascular dementia with behavioral disturbance (HCC) Resumed Seroquel 100 mg at 6 PM daily and ordered Seroquel 50 mg nightly as needed Resumed home lorazepam 0.5 mg p.o. twice daily as needed for anxiety Placement pending for SNF/memory care   DVT prophylaxis:Lovenox Code Status: DNR Family Communication: None at bedside Disposition Plan:  Status is: Inpatient Remains inpatient appropriate because: Awaiting placement.   Nutritional Assessment:  The patient's BMI is: Body mass index is 24.87 kg/m.Marland Kitchen  Seen by dietician.  I agree with the assessment and plan as outlined below:  Nutrition Status: Nutrition Problem: Moderate Malnutrition Etiology: chronic illness (dementia) Signs/Symptoms: moderate fat depletion, severe fat depletion, moderate muscle depletion, severe muscle depletion    .    Skin Assessment:  I have examined the patient's skin and I agree with the wound assessment as performed by the wound care RN as outlined below:  Pressure Injury 11/22/21 Buttocks Left Stage 1 -  Intact skin with non-blanchable redness of a localized area usually over a bony prominence.  white/pink discoloration of skin (Active)  11/22/21 1530  Location: Buttocks  Location Orientation: Left  Staging: Stage 1 -  Intact skin with non-blanchable redness of a localized area usually over a bony prominence.  Wound Description (Comments): white/pink discoloration of skin  Present on Admission: Yes  Dressing Type Foam - Lift dressing to assess site every shift 12/02/21 0808     Pressure Injury 11/22/21 Buttocks Right Stage 1 -  Intact skin with non-blanchable redness of a localized area usually over a bony prominence. pink/white color skin (Active)  11/22/21 1530  Location: Buttocks  Location Orientation: Right  Staging: Stage 1 -  Intact skin with non-blanchable redness of a localized area usually over a bony prominence.  Wound Description (Comments): pink/white color skin  Present on Admission: Yes  Dressing Type Foam - Lift dressing to assess site every shift 12/02/21 0808    Consultants:  None  Procedures:  None  Antimicrobials:  None   Subjective: Patient seen and evaluated today with no new acute complaints or concerns. No acute concerns or events noted overnight.  He remains pleasantly confused and is hard of hearing.  Safety sitter is at bedside.  Objective: Vitals:   12/01/21 1548 12/01/21 1912 12/02/21 0535 12/02/21 0808  BP: 120/66 128/70 131/67 125/76  Pulse: 73 75 69 70  Resp: '15 17 17 18  '$ Temp: 98.3 F (36.8 C) 98.3 F (36.8 C) 98 F (36.7 C) 98.4 F (36.9 C)  TempSrc: Oral Oral  Oral  SpO2: 98% 98% 99%   Weight:      Height:        Intake/Output Summary (Last 24 hours) at 12/02/2021 1051 Last data filed at 12/02/2021 0913 Gross per 24 hour  Intake 1095 ml  Output 950 ml  Net 145 ml   Filed Weights   11/22/21 1609 11/29/21 1801  Weight: 75.3 kg 74.2 kg    Examination:  General exam: Appears calm and comfortable, hard of hearing Respiratory system: Clear to auscultation. Respiratory effort normal. Cardiovascular system: S1 & S2  heard, RRR.  Gastrointestinal system: Abdomen is soft Central nervous system: Alert and awake Extremities: No edema Skin: No significant lesions noted Psychiatry: Flat affect.    Data Reviewed: I have personally reviewed following labs and imaging studies  CBC: Recent Labs  Lab 11/26/21 1215 11/29/21 0458 11/30/21 0505 12/01/21 0546 12/02/21 0550  WBC 9.6 10.7* 10.0 8.7 9.7  HGB 12.4* 12.3* 11.3* 11.9* 12.1*  HCT 38.6* 38.3* 35.2* 37.2* 37.7*  MCV 86.9 87.6 87.8 86.1 87.3  PLT 256 249 245 258 629   Basic Metabolic Panel: Recent Labs  Lab 11/26/21 1215 11/29/21 0458 11/30/21 0505 12/01/21 0546 12/02/21 0550  NA 140  --  138 141 142  K 3.6  --  2.9* 3.0* 3.7  CL 112*  --  110 113* 117*  CO2 23  --  24 24 21*  GLUCOSE 105*  --  98 92 96  BUN 24*  --  28* 23 26*  CREATININE 1.14 1.29* 1.19 1.03 1.14  CALCIUM 8.5*  --  7.8* 8.4* 8.2*  MG  --   --  2.3  --   --    GFR: Estimated Creatinine Clearance: 40 mL/min (by C-G formula based on SCr of 1.14 mg/dL). Liver Function Tests: No results for input(s): "AST", "ALT", "ALKPHOS", "BILITOT", "PROT", "ALBUMIN" in the last 168 hours. No results for input(s): "LIPASE", "  AMYLASE" in the last 168 hours. No results for input(s): "AMMONIA" in the last 168 hours. Coagulation Profile: No results for input(s): "INR", "PROTIME" in the last 168 hours. Cardiac Enzymes: No results for input(s): "CKTOTAL", "CKMB", "CKMBINDEX", "TROPONINI" in the last 168 hours. BNP (last 3 results) No results for input(s): "PROBNP" in the last 8760 hours. HbA1C: No results for input(s): "HGBA1C" in the last 72 hours. CBG: No results for input(s): "GLUCAP" in the last 168 hours. Lipid Profile: No results for input(s): "CHOL", "HDL", "LDLCALC", "TRIG", "CHOLHDL", "LDLDIRECT" in the last 72 hours. Thyroid Function Tests: No results for input(s): "TSH", "T4TOTAL", "FREET4", "T3FREE", "THYROIDAB" in the last 72 hours. Anemia Panel: No results for  input(s): "VITAMINB12", "FOLATE", "FERRITIN", "TIBC", "IRON", "RETICCTPCT" in the last 72 hours. Sepsis Labs: No results for input(s): "PROCALCITON", "LATICACIDVEN" in the last 168 hours.  Recent Results (from the past 240 hour(s))  SARS Coronavirus 2 by RT PCR (hospital order, performed in [facility] hospital lab) *cepheid single result test* Anterior Nasal Swab     Status: None   Collection Time: 11/22/21 12:25 PM   Specimen: Anterior Nasal Swab  Result Value Ref Range Status   SARS Coronavirus 2 by RT PCR NEGATIVE NEGATIVE Final    Comment: (NOTE) SARS-CoV-2 target nucleic acids are NOT DETECTED.  The SARS-CoV-2 RNA is generally detectable in upper and lower respiratory specimens during the acute phase of infection. The lowest concentration of SARS-CoV-2 viral copies this assay can detect is 250 copies / mL. A negative result does not preclude SARS-CoV-2 infection and should not be used as the sole basis for treatment or other patient management decisions.  A negative result may occur with improper specimen collection / handling, submission of specimen other than nasopharyngeal swab, presence of viral mutation(s) within the areas targeted by this assay, and inadequate number of viral copies (<250 copies / mL). A negative result must be combined with clinical observations, patient history, and epidemiological information.  Fact Sheet for Patients:   https://www.patel.info/  Fact Sheet for Healthcare Providers: https://hall.com/  This test is not yet approved or  cleared by the [place] FDA and has been authorized for detection and/or diagnosis of SARS-CoV-2 by FDA under an Emergency Use Authorization (EUA).  This EUA will remain in effect (meaning this test can be used) for the duration of the COVID-19 declaration under Section 564(b)(1) of the Act, 21 U.S.C. section 360bbb-3(b)(1), unless the authorization is terminated  or revoked sooner.  Performed at [facility], [address] [place], [place] [address]          Radiology Studies: No results found.      Scheduled Meds:  vitamin C  500 mg Oral BID   enoxaparin (LOVENOX) injection  40 mg Subcutaneous QHS   feeding supplement  237 mL Oral TID BM   levothyroxine  50 mcg Oral Q0600   lidocaine  1 Application Urethral Once   multivitamin with minerals  1 tablet Oral Daily   QUEtiapine  100 mg Oral q1800     LOS: 9 days    Time spent: 35 minutes    Dontarious Schaum Darleen Crocker, DO [facility]  If 7PM-7AM, please contact night-coverage [email or web address] 12/02/2021, 10:51 AM

## 2021-12-03 DIAGNOSIS — E870 Hyperosmolality and hypernatremia: Secondary | ICD-10-CM | POA: Diagnosis not present

## 2021-12-03 NOTE — Assessment & Plan Note (Signed)
Resolved

## 2021-12-03 NOTE — Assessment & Plan Note (Signed)
   Resumed Seroquel 100 mg at 6 PM daily and ordered Seroquel 50 mg nightly as needed  Resumed home lorazepam 0.5 mg p.o. twice daily as needed for anxiety  Placement pending for SNF/memory care

## 2021-12-03 NOTE — Progress Notes (Signed)
Progress Note   Patient: Corey Todd ZHG:[id number] DOB: [birth year] DOA: 11/22/2021     10 DOS: the patient was seen and examined on 12/03/2021   Brief hospital course: [name] is a 88 year old male with vascular dementia with nighttime agitation, hypothyroid, constipation, anxiety, history of sundowning, who presents to the emergency department 11/22/2021 from home via EMS for chief concerns of poor p.o. intake for 1 month worse over past 2 days . 08/09: sodium 151, creatinine 1.61, GFR 40, Free T4 / TSH was appropriate levels despite not on home medications per wife. COVID by PCR was negative. ED treatment: LR 500 mL bolus.  Admitted to hospitalist service for hyponatremia secondary to dehydration/poor p.o. intake.  Wife is requesting SNF placement, TOC consulted.  Synthroid restarted.  Marland Kitchen 08/10: Sodium WNL at 141, potassium low at 3.0 and was repleted, creatinine improved to 1.31 with GFR 51.  Hgb dropped to 10.9 likely hemodilutional from IV fluids.  Placement pending, following potassium . 08/11-08/16: SNF placement pending.  . 8/17-8/19: Palliative care consult, pending SNF placement.  8/20: Patient remained stable.  Awaiting SNF placement.   Assessment and Plan: * Hypernatremia Secondary to dehydration in setting of poor p.o. intake Resolved .  AKI (acute kidney injury) ([place]) Resolved   Protein-calorie malnutrition, moderate ([place]) Estimated body mass index is 24.87 kg/m as calculated from the following:   Height as of this encounter: '5\' 8"'$  (1.727 m).    Weight as of this encounter: 74.2 kg.    Complicates overall prognosis.  Dietitian consult  Vascular dementia with behavioral disturbance (HCC)  Resumed Seroquel 100 mg at 6 PM daily and ordered Seroquel 50 mg nightly as needed  Resumed home lorazepam 0.5 mg p.o. twice daily as needed for anxiety  Placement pending for SNF/memory care  Acquired hypothyroidism  Versus sub-clinical  TSH on admission  was 5.287 Free T4 was within normal limits  Per admitting physician's discussion with spouse over the phone, she did not know that her husband was prescribed thyroid medication and she has not given it to him in over a month, may explains why he has been more weak than baseline with poor p.o. intake in over a month, may explain constipation  resumed his home hypothyroid medication however his TSH and free T4 is appropriate for his age  Follow TSH outpatient  Weakness  Multifactorial  PT/OT recommends SNF.  TOC working on placement.  Hypokalemia  Repleted  Dehydration Resolved  Secondary to poor PO intake  Encourage po  Sundowning  Resumed home Seroquel 100 mg daily at 6 PM  Placement/memory care pending.  Safety sitter in place  Constipation Resolved - large BM 08/10  Asymptomatic on evaluation and physical exam  Abdominal x-ray concerning for ileus 08/10 --> adjusted bowel regimen to scheduled  Synthroid restarted, constipation may also be due to subclinical hypothyroid     Subjective: Patient was seen and examined today.  No new complaints.  Son at bedside.  Physical Exam: Vitals:   12/02/21 1935 12/03/21 0330 12/03/21 0724 12/03/21 1238  BP: 134/63 127/76 131/69 139/78  Pulse: 83 75 73 73  Resp: '16 16 16 16  '$ Temp: 97.7 F (36.5 C) 97.8 F (36.6 C) 98.6 F (37 C) 98.5 F (36.9 C)  TempSrc: Axillary Axillary Oral Oral  SpO2: 98% 98% 100% 99%  Weight:      Height:       Todd.  Frail elderly man, in no acute distress. Pulmonary.  Lungs clear bilaterally, normal  respiratory effort. CV.  Regular rate and rhythm, no JVD, rub or murmur. Abdomen.  Soft, nontender, nondistended, BS positive. CNS.  Alert and oriented .  No focal neurologic deficit. Extremities.  No edema, no cyanosis, pulses intact and symmetrical. Psychiatry.  Judgment and insight appears impaired.  Data Reviewed: Prior data reviewed  Family Communication: Discussed with son at  bedside  Disposition: Status is: Inpatient Remains inpatient appropriate because: Awaiting placement   Planned Discharge Destination: Skilled nursing facility  DVT prophylaxis.  Lovenox Time spent: 40 minutes   This record has been created using Systems analyst. Errors have been sought and corrected,but may not always be located. Such creation errors do not reflect on the standard of care.  Author: Lorella Nimrod, MD 12/03/2021 3:49 PM  For on call review [email or web address].

## 2021-12-03 NOTE — TOC Progression Note (Signed)
Transition of Care [facility]) - Progression Note    Patient Details  Name: Corey Todd MRN: [id number] Date of Birth: Jun 22, 1927  Transition of Care [facility]) CM/SW Contact  Beverly Sessions, RN Phone Number: 12/03/2021, 12:30 PM  Clinical Narrative:     TOC to [facility] up with [facility] tomorrow during business hours        Expected Discharge Plan and Services                                                 Social Determinants of Health (SDOH) Interventions    Readmission Risk Interventions     No data to display

## 2021-12-03 NOTE — Assessment & Plan Note (Signed)
Secondary to dehydration in setting of poor p.o. intake Resolved .

## 2021-12-03 NOTE — Assessment & Plan Note (Signed)
Estimated body mass index is 24.87 kg/m as calculated from the following:   Height as of this encounter: '5\' 8"'$  (1.727 m).    Weight as of this encounter: 74.2 kg.    Complicates overall prognosis.  Dietitian consult

## 2021-12-04 DIAGNOSIS — E870 Hyperosmolality and hypernatremia: Secondary | ICD-10-CM | POA: Diagnosis not present

## 2021-12-04 NOTE — Progress Notes (Signed)
Physical Therapy Treatment Corey Todd Details Name: Corey Todd MRN: [id number] DOB: 1927-12-07 Today's Date: 12/04/2021   History of Present Illness Corey Todd is a 88 year old male with vascular dementia with nighttime agitation, hypothyroid, constipation, anxiety, history of sundowning, who presents to the emergency department from home for chief concerns of poor p.o. intake for 2 days.    PT Comments    Pt is making gradual progress towards goals, very sleepy this date and difficult to arouse. Able to transfer from bed->chair with use of RW. Poor retention and carry over of tasks. Sitter dc, pt left in recliner with alarm activated. Will continue to progress.   Recommendations for follow up therapy are one component of a multi-disciplinary discharge planning process, led by the attending physician.  Recommendations may be updated based on Corey Todd status, additional functional criteria and insurance authorization.  Follow Up Recommendations  Skilled nursing-short term rehab (<3 hours/day) Can Corey Todd physically be transported by private vehicle: Yes   Assistance Recommended at Discharge Frequent or constant Supervision/Assistance  Corey Todd can return home with the following A little help with walking and/or transfers;A lot of help with bathing/dressing/bathroom;Assistance with cooking/housework;Assistance with feeding;Direct supervision/assist for medications management;Direct supervision/assist for financial management;Assist for transportation;Help with stairs or ramp for entrance   Equipment Recommendations  None recommended by PT    Recommendations for Other Services       Precautions / Restrictions Precautions Precautions: Fall Restrictions Weight Bearing Restrictions: No     Mobility  Bed Mobility Overal bed mobility: Needs Assistance Bed Mobility: Supine to Sit     Supine to sit: Mod assist     General bed mobility comments: needs cues for sequencing  including mod assist for sliding B LEs across bed. Once seated, able to sit for prolonged time and pt demonstrated more alertness    Transfers Overall transfer level: Needs assistance Equipment used: Rolling walker (2 wheels) Transfers: Sit to/from Stand Sit to Stand: Mod assist           General transfer comment: placed gait belt on Corey Todd, however pt took gait belt off. Transfers with mod assist and RW. forward flexed posture noted    Ambulation/Gait Ambulation/Gait assistance: Min assist Gait Distance (Feet): 5 Feet Assistive device: Rolling walker (2 wheels) Gait Pattern/deviations: Step-to pattern       General Gait Details: ambulated distance limited due to lethargy. Ambulated with RW to recliner   Stairs             Wheelchair Mobility    Modified Rankin (Stroke Patients Only)       Balance Overall balance assessment: Needs assistance Sitting-balance support: Feet supported Sitting balance-Leahy Scale: Good     Standing balance support: Bilateral upper extremity supported Standing balance-Leahy Scale: Fair                              Cognition Arousal/Alertness: Lethargic Behavior During Therapy: Flat affect Overall Cognitive Status: History of cognitive impairments - at baseline                                 General Comments: pt lethargic, takes extended time to respond to questions.        Exercises Other Exercises Other Exercises: attempted B LEs, able to do 2-3 reps of LAQ, however unable to further follow instructions for ther-ex. Other Exercises: gave cookie and water to  Corey Todd and repositioned for comfort.    General Comments        Pertinent Vitals/Pain Pain Assessment Pain Assessment: No/denies pain    Home Living                          Prior Function            PT Goals (current goals can now be found in the care plan section) Acute Rehab PT Goals Corey Todd Stated Goal: unable  to state PT Goal Formulation: With Corey Todd Time For Goal Achievement: 12/07/21 Potential to Achieve Goals: Good Progress towards PT goals: Progressing toward goals    Frequency    Min 2X/week      PT Plan Current plan remains appropriate    Co-evaluation              AM-PAC PT "6 Clicks" Mobility   Outcome Measure  Help needed turning from your back to your side while in a flat bed without using bedrails?: None Help needed moving from lying on your back to sitting on the side of a flat bed without using bedrails?: A Lot Help needed moving to and from a bed to a chair (including a wheelchair)?: A Lot Help needed standing up from a chair using your arms (e.g., wheelchair or bedside chair)?: A Lot Help needed to walk in hospital room?: A Little Help needed climbing 3-5 steps with a railing? : A Lot 6 Click Score: 15    End of Session   Activity Tolerance: Corey Todd limited by lethargy Corey Todd left: in chair;with chair alarm set Nurse Communication: Mobility status PT Visit Diagnosis: Unsteadiness on feet (R26.81);Muscle weakness (generalized) (M62.81);Difficulty in walking, not elsewhere classified (R26.2)     Time: 1761-6073 PT Time Calculation (min) (ACUTE ONLY): 13 min  Charges:  $Therapeutic Activity: 8-22 mins                     Corey Todd, PT, DPT, GCS [phone]    Corey Todd 12/04/2021, 11:15 AM

## 2021-12-04 NOTE — Progress Notes (Signed)
Progress Note   Patient: Corey Todd IRW:[id number] DOB: 1928/03/15 DOA: 11/22/2021     11 DOS: the patient was seen and examined on 12/04/2021   Brief hospital course: Mr. Remington Highbaugh is a 88 year old male with vascular dementia with nighttime agitation, hypothyroid, constipation, anxiety, history of sundowning, who presents to the emergency department 11/22/2021 from home via EMS for chief concerns of poor p.o. intake for 1 month worse over past 2 days . 08/09: sodium 151, creatinine 1.61, GFR 40, Free T4 / TSH was appropriate levels despite not on home medications per wife. COVID by PCR was negative. ED treatment: LR 500 mL bolus.  Admitted to hospitalist service for hyponatremia secondary to dehydration/poor p.o. intake.  Wife is requesting SNF placement, TOC consulted.  Synthroid restarted.  Marland Kitchen 08/10: Sodium WNL at 141, potassium low at 3.0 and was repleted, creatinine improved to 1.31 with GFR 51.  Hgb dropped to 10.9 likely hemodilutional from IV fluids.  Placement pending, following potassium . 08/11-08/16: SNF placement pending.  . 8/17-8/19: Palliative care consult, pending SNF placement.  8/20: Patient remained stable.  Awaiting SNF placement.  8/21: Still no [facility] approved bed available.   Assessment and Plan: * Hypernatremia Secondary to dehydration in setting of poor p.o. intake Resolved .  AKI (acute kidney injury) ([place]) Resolved   Protein-calorie malnutrition, moderate ([place]) Estimated body mass index is 24.87 kg/m as calculated from the following:   Height as of this encounter: '5\' 8"'$  (1.727 m).    Weight as of this encounter: 74.2 kg.    Complicates overall prognosis.  Dietitian consult  Vascular dementia with behavioral disturbance (HCC)  Resumed Seroquel 100 mg at 6 PM daily and ordered Seroquel 50 mg nightly as needed  Resumed home lorazepam 0.5 mg p.o. twice daily as needed for anxiety  Placement pending for SNF/memory care  Acquired  hypothyroidism  Versus sub-clinical  TSH on admission was 5.287 Free T4 was within normal limits  Per admitting physician's discussion with spouse over the phone, she did not know that her husband was prescribed thyroid medication and she has not given it to him in over a month, may explains why he has been more weak than baseline with poor p.o. intake in over a month, may explain constipation  resumed his home hypothyroid medication however his TSH and free T4 is appropriate for his age  Follow TSH outpatient  Weakness  Multifactorial  PT/OT recommends SNF.  TOC working on placement.  Hypokalemia  Repleted  Dehydration Resolved  Secondary to poor PO intake  Encourage po  Sundowning  Resumed home Seroquel 100 mg daily at 6 PM  Placement/memory care pending.  Safety sitter in place  Constipation Resolved - large BM 08/10  Asymptomatic on evaluation and physical exam  Abdominal x-ray concerning for ileus 08/10 --> adjusted bowel regimen to scheduled  Synthroid restarted, constipation may also be due to subclinical hypothyroid        Subjective: Patient appears somnolent.  Easily arousable, just following with eyes but not following any other commands.  Per nursing staff this is his normal behavior specially after waking up as he takes longer to become little more oriented.  Physical Exam: Vitals:   12/03/21 1930 12/04/21 0500 12/04/21 0817 12/04/21 1559  BP: 136/76 121/68 127/76 134/77  Pulse: 75  73 78  Resp: '16 16 16 18  '$ Temp: 98.4 F (36.9 C)  97.6 F (36.4 C) 98.1 F (36.7 C)  TempSrc: Oral  Oral Oral  SpO2: 98%  97% 96%  Weight:      Height:       General.  Frail elderly man, in no acute distress. Pulmonary.  Lungs clear bilaterally, normal respiratory effort. CV.  Regular rate and rhythm, no JVD, rub or murmur. Abdomen.  Soft, nontender, nondistended, BS positive. CNS.  Somnolent, no apparent focal deficit Extremities.  No edema, no  cyanosis, pulses intact and symmetrical. Psychiatry.  Judgment and insight appears impaired.  Data Reviewed: Prior data reviewed  Family Communication:   Disposition: Status is: Inpatient Remains inpatient appropriate because: Awaiting placement   Planned Discharge Destination: Skilled nursing facility  DVT prophylaxis.  Lovenox Time spent: 38 minutes  This record has been created using Systems analyst. Errors have been sought and corrected,but may not always be located. Such creation errors do not reflect on the standard of care.  Author: Lorella Nimrod, MD 12/04/2021 4:43 PM  For on call review [email or web address].

## 2021-12-04 NOTE — Progress Notes (Addendum)
Mobility Specialist - Progress Note   12/04/21 1400  Mobility  Activity Ambulated with assistance in hallway;Transferred from chair to bed  Level of Assistance Standby assist, set-up cues, supervision of patient - no hands on  Assistive Device Front wheel walker  Distance Ambulated (ft) 160 ft  Activity Response Tolerated well  $Mobility charge 1 Mobility     Pt sitting in recliner upon arrival, utilizing RA. Pt stood with minA and momentum. Ambulated in hallway with SBA-CGA d/t pt maintaining unsafe distance from RW despite repetitive multi-cueing. Forward flexed posture worsens with increased distance. No complaints. Follows cues fairly. Pt left in bed with alarm set, needs in reach.    Kathee Delton Mobility Specialist 12/04/21, 2:58 PM

## 2021-12-04 NOTE — TOC Progression Note (Signed)
Transition of Care [facility]) - Progression Note    Patient Details  Name: Corey Todd MRN: [id number] Date of Birth: 1927-08-02  Transition of Care [facility]) CM/SW Contact  Beverly Sessions, RN Phone Number: 12/04/2021, 3:05 PM  Clinical Narrative:      [place] left with secretary  The greens at [place] - spoke with [facility].  They are only taking patients for STR, and would not be able to transition patient to LTC.  She states they only way they would be able to take patient for STR is if family has somewhere for patient to go after [place]- spoke with Levada Dy they are unable to take [facility]      Expected Discharge Plan and Services                                                 Social Determinants of Health (SDOH) Interventions    Readmission Risk Interventions     No data to display

## 2021-12-05 DIAGNOSIS — E039 Hypothyroidism, unspecified: Secondary | ICD-10-CM | POA: Diagnosis not present

## 2021-12-05 DIAGNOSIS — E440 Moderate protein-calorie malnutrition: Secondary | ICD-10-CM | POA: Diagnosis not present

## 2021-12-05 DIAGNOSIS — E860 Dehydration: Secondary | ICD-10-CM | POA: Diagnosis not present

## 2021-12-05 DIAGNOSIS — E870 Hyperosmolality and hypernatremia: Secondary | ICD-10-CM | POA: Diagnosis not present

## 2021-12-05 NOTE — Progress Notes (Signed)
Progress Note   Patient: Corey Todd [id number]:[id number] DOB: 04/23/1927 DOA: 11/22/2021     12 DOS: the patient was seen and examined on 12/05/2021   Brief hospital course: [name] is a 88 year old male with vascular dementia with nighttime agitation, hypothyroid, constipation, anxiety, history of sundowning, who presents to the emergency department 11/22/2021 from home via EMS for chief concerns of poor p.o. intake for 1 month worse over past 2 days . 08/09: sodium 151, creatinine 1.61, GFR 40, Free T4 / TSH was appropriate levels despite not on home medications per wife. COVID by PCR was negative. ED treatment: LR 500 mL bolus.  Admitted to hospitalist service for hyponatremia secondary to dehydration/poor p.o. intake.  Wife is requesting SNF placement, TOC consulted.  Synthroid restarted.  Marland Kitchen 08/10: Sodium WNL at 141, potassium low at 3.0 and was repleted, creatinine improved to 1.31 with GFR 51.  Hgb dropped to 10.9 likely hemodilutional from IV fluids.  Placement pending, following potassium . 08/11-08/16: SNF placement pending.  . 8/17-8/19: Palliative care consult, pending SNF placement.  8/20: Patient remained stable.  Awaiting SNF placement.  8/21: Still no [facility] approved bed available.  8/22: Still no [facility] bed available.   Assessment and Plan: * Hypernatremia Secondary to dehydration in setting of poor p.o. intake Resolved .  AKI (acute kidney injury) ([place]) Resolved   Protein-calorie malnutrition, moderate ([place]) Estimated body mass index is 24.87 kg/m as calculated from the following:   Height as of this encounter: '5\' 8"'$  (1.727 m).    Weight as of this encounter: 74.2 kg.    Complicates overall prognosis.  Dietitian consult  Vascular dementia with behavioral disturbance (HCC)  Resumed Seroquel 100 mg at 6 PM daily and ordered Seroquel 50 mg nightly as needed  Resumed home lorazepam 0.5 mg p.o. twice daily as needed for anxiety  Placement pending for  SNF/memory care  Acquired hypothyroidism  Versus sub-clinical  TSH on admission was 5.287 Free T4 was within normal limits  Per admitting physician's discussion with spouse over the phone, she did not know that her husband was prescribed thyroid medication and she has not given it to him in over a month, may explains why he has been more weak than baseline with poor p.o. intake in over a month, may explain constipation  resumed his home hypothyroid medication however his TSH and free T4 is appropriate for his age  Follow TSH outpatient  Weakness  Multifactorial  PT/OT recommends SNF.  TOC working on placement.  Hypokalemia  Repleted  Dehydration Resolved  Secondary to poor PO intake  Encourage po  Sundowning  Resumed home Seroquel 100 mg daily at 6 PM  Placement/memory care pending.  Safety sitter in place  Constipation Resolved - large BM 08/10  Asymptomatic on evaluation and physical exam  Abdominal x-ray concerning for ileus 08/10 --> adjusted bowel regimen to scheduled  Synthroid restarted, constipation may also be due to subclinical hypothyroid        Subjective: Patient was sleeping comfortably when seen today.  He does not want to get discharged and would like to get some sleep.  No other concerns.  Physical Exam: Vitals:   12/04/21 1949 12/05/21 0345 12/05/21 0746 12/05/21 1543  BP: 110/71 113/72 117/61 119/69  Pulse: 85 87 81 74  Resp: '16 20 18 18  '$ Temp: 97.9 F (36.6 C) 97.9 F (36.6 C) 99.1 F (37.3 C) 98.5 F (36.9 C)  TempSrc: Axillary Axillary    SpO2: 94% 95% 97%  94%  Weight:      Height:       General.  Frail elderly man, in no acute distress. Pulmonary.  Lungs clear bilaterally, normal respiratory effort. CV.  Regular rate and rhythm, no JVD, rub or murmur. Abdomen.  Soft, nontender, nondistended, BS positive. CNS.  Somnolent. Extremities.  No edema, no cyanosis, pulses intact and symmetrical. Psychiatry.  Judgment and  insight appears impaired.  Data Reviewed: Prior data reviewed  Family Communication: Discussed with wife on phone.  Disposition: Status is: Inpatient Remains inpatient appropriate because: Unsafe discharge, difficult disposition.   Planned Discharge Destination: Skilled nursing facility  DVT prophylaxis: Lovenox Time spent: 38 minutes   This record has been created using Systems analyst. Errors have been sought and corrected,but may not always be located. Such creation errors do not reflect on the standard of care.  Author: Lorella Nimrod, MD 12/05/2021 3:54 PM  For on call review [email or web address].

## 2021-12-05 NOTE — TOC Progression Note (Signed)
Transition of Care [facility]) - Progression Note    Patient Details  Name: DRAVON NOTT MRN: [id number] Date of Birth: 09/03/1927  Transition of Care [facility]) CM/SW Contact  Beverly Sessions, RN Phone Number: 12/05/2021, 2:03 PM  Clinical Narrative:     [facility]aud[facility] Lick - still no beds available  - Received call from April at [facility].  She recommended checking with Arbie Cookey at the [facility]  Arbie Cookey states that they are only able to accept patients that are 0-70% service connected.  She states that patients like Mr Clymer that are 100% service connected are listed as v7, and they are only able to have 3 (V7) at a time.  She states that due to covid they currently have 16 (v7)  so they would be unable to assist    Spoke with wife and granddaughter about services [employer] can provide. They are both in agreement for me to reach out Danielle with [employer] to see if she has any suggestions regarding placement.   Referral made to [facility]      Expected Discharge Plan and Services                                                 Social Determinants of Health (SDOH) Interventions    Readmission Risk Interventions     No data to display

## 2021-12-05 NOTE — Progress Notes (Signed)
Mobility Specialist - Progress Note   12/05/21 1700  Mobility  Activity Ambulated with assistance in hallway  Level of Assistance Standby assist, set-up cues, supervision of patient - no hands on  Assistive Device Front wheel walker  Distance Ambulated (ft) 160 ft  Activity Response Tolerated well  $Mobility charge 1 Mobility     Pt lying in bed upon arrival, utilizing RA. Bed mobility completed with minA. Requires task initiating and directional cueing. SBA-CGA for RW gait with flexed posture. Pt returned to bed with alarm set, needs in reach.    Kathee Delton Mobility Specialist 12/05/21, 5:06 PM

## 2021-12-05 NOTE — Progress Notes (Signed)
Palliative Care Progress Note, Assessment & Plan   Patient Name: Corey Todd       Date: 12/05/2021 DOB: 1927-09-07  Age: 88 y.o. MRN#: [id number] Attending Physician: Lorella Nimrod, MD Primary Care Physician: Crecencio Mc, MD Admit Date: 11/22/2021  Reason for Consultation/Follow-up: Establishing goals of care  Subjective: Patient is lying in bed in no apparent distress.  He acknowledges my presence and is able to make his wishes known.  He quickly falls back to sleep.  No family at bedside.  HPI: 88 y.o. male  with past medical history of hypothyroidism, constipation, anxiety, and vascular dementia (agitation/sundowning at bedtime) admitted on 11/22/2021 with poor p.o. intake over the last month.  Patient is being treated for hypernatremia.  Patient is pending SNF placement for memory care unit as patient's wife is unable to provide adequate care for patient at home.   Palliative medicine team was consulted to discuss goals of care.     Summary of counseling/coordination of care: After reviewing the patient's chart and assessing the patient at bedside, I spoke with dayshift RN.  Nurse endorses patient has been compliant and pleasant.  He is able to take his medications.  He is cooperating with mobility specialist.  After receiving updates from nursing, I spoke with patient's wife over the phone.  Medical update given.  I spoke with [name] and introduced concept of MOST form. Reviewed if patient ever made his wishes known for feeding tube, artificial hydration, or other advance care planning decisions.    [name] shares that she had a discussion with her husband in regards to his CODE STATUS but did not discuss anything further.  She believes "he would not want any of that done and want to pass  naturally".  However, she is not prepared to make these decisions at this time.  She is appropriately tearful and saying she will likely follow him to the LTC facility as her health is not well.  Therapeutic silence and active listening provided for her to share thoughts and emotions regarding patient's current medical situation.  Reviewed patient's functional, cognitive, and nutritional status.  Conveyed patient worked with mobility specialist today.  I again encouraged patient's wife to call nurses station to receive updates in regards to patient's current medical state.  Discussed outpatient palliative services as extra layer of support for patient and family when he is transferred out of hospital.  [name] is appreciative and shares she would like to have someone to discuss "these things" with but just cannot make a decision as this time.  TOC referral placed.  Goals are clear. DNR remains. Plan is for LTC with outpatient palliative to follow. Dispo pending.   PMT will continue to monitor the patient and shadow his chart.  PMT will reengage at patient/family's request, if goals change, or if patient's health status declines.  Code Status: DNR  Prognosis: Unable to determine  Discharge Planning: LTC with outpatient palliative to follow  Physical Exam Vitals reviewed.  Constitutional:      General: He is not in acute distress.    Appearance: Normal appearance. He is not ill-appearing.  HENT:     Mouth/Throat:     Mouth: Mucous membranes  are moist.  Pulmonary:     Effort: Pulmonary effort is normal.  Abdominal:     Palpations: Abdomen is soft.  Skin:    General: Skin is warm and dry.             Palliative Assessment/Data: 40%    Total Time 25 minutes  Greater than 50%  of this time was spent counseling and coordinating care related to the above assessment and plan.  Thank you for allowing the Palliative Medicine Team to assist in the care of this patient.  [place]  Ilsa Iha, FNP-BC Palliative Medicine Team Team Phone # [phone]

## 2021-12-06 ENCOUNTER — Encounter: Payer: Self-pay | Admitting: *Deleted

## 2021-12-06 DIAGNOSIS — E870 Hyperosmolality and hypernatremia: Secondary | ICD-10-CM | POA: Diagnosis not present

## 2021-12-06 LAB — CREATININE, SERUM
Creatinine, Ser: 1.26 mg/dL — ABNORMAL HIGH (ref 0.61–1.24)
GFR, Estimated: 54 mL/min — ABNORMAL LOW (ref >60)

## 2021-12-06 NOTE — Progress Notes (Signed)
Mobility Specialist - Progress Note    12/06/21 1100  Mobility  Activity Ambulated with assistance in hallway;Stood at bedside;Transferred from bed to chair  Level of Assistance Minimal assist, patient does 75% or more  Assistive Device Front wheel walker  Distance Ambulated (ft) 80 ft  Activity Response Tolerated well  $Mobility charge 1 Mobility     Pt supine upon arrival using RA. Completes bed mobility with HHA and STS CGA with verbal cues for hand placement. Ambulates 57f in hallway MinA --- anterior lean throughout. Has incontinent BM and is rolled back into room for pericare. Pt tolerates well and is left in chair with alarm set and needs in reach.  MMerrily BrittleMobility Specialist 12/06/21, 11:38 AM

## 2021-12-06 NOTE — Progress Notes (Signed)
End of shift note:  Pt had decreased PO intake today. VSS. Family was updated about plan of care.

## 2021-12-06 NOTE — Progress Notes (Signed)
Physical Therapy Treatment Patient Details Name: Corey Todd MRN: [id number] DOB: [birth year] Today's Date: 12/06/2021   History of Present Illness Mr. Saintclair Schroader is a 88 year old male with vascular dementia with nighttime agitation, hypothyroid, constipation, anxiety, history of sundowning, who presents to the emergency department from home for chief concerns of poor p.o. intake for 2 days.    PT Comments    Pt is making gradual progress towards goals. Demonstrates zero retention with safety cues including keeping RW closer to body center of mass. Very pleasant and agreeable to work with therapist, however unable to follow commands for there-ex. Would consider discharging from physical therapy caseload and continue mobility efforts with mobility specialist and nursing staff if pt continues to be unable to progress towards goals.   Recommendations for follow up therapy are one component of a multi-disciplinary discharge planning process, led by the attending physician.  Recommendations may be updated based on patient status, additional functional criteria and insurance authorization.  Follow Up Recommendations  Long-term institutional care without follow-up therapy Can patient physically be transported by private vehicle: Yes   Assistance Recommended at Discharge Frequent or constant Supervision/Assistance  Patient can return home with the following A little help with walking and/or transfers;A lot of help with bathing/dressing/bathroom;Assistance with cooking/housework;Assistance with feeding;Direct supervision/assist for medications management;Direct supervision/assist for financial management;Assist for transportation;Help with stairs or ramp for entrance   Equipment Recommendations  None recommended by PT    Recommendations for Other Services       Precautions / Restrictions Precautions Precautions: Fall Restrictions Weight Bearing Restrictions: No     Mobility  Bed  Mobility Overal bed mobility: Needs Assistance Bed Mobility: Sit to Supine       Sit to supine: Min guard   General bed mobility comments: safe technique, needs heavy cues for commands    Transfers Overall transfer level: Needs assistance Equipment used: Rolling walker (2 wheels) Transfers: Sit to/from Stand Sit to Stand: Min assist           General transfer comment: forward flexed posture    Ambulation/Gait Ambulation/Gait assistance: Min assist Gait Distance (Feet): 120 Feet Assistive device: Rolling walker (2 wheels) Gait Pattern/deviations: Step-through pattern       General Gait Details: ambulated with forward flexed posture and keeps RW too far away, unable to correct. Unsafe with turns   Marine scientist Rankin (Stroke Patients Only)       Balance Overall balance assessment: Needs assistance Sitting-balance support: Feet supported Sitting balance-Leahy Scale: Good     Standing balance support: Bilateral upper extremity supported Standing balance-Leahy Scale: Fair                              Cognition Arousal/Alertness: Lethargic Behavior During Therapy: Flat affect Overall Cognitive Status: History of cognitive impairments - at baseline                                 General Comments: sleeping, however once awakens is more alert than previous session        Exercises      General Comments        Pertinent Vitals/Pain Pain Assessment Pain Assessment: No/denies pain    Home Living  Prior Function            PT Goals (current goals can now be found in the care plan section) Acute Rehab PT Goals Patient Stated Goal: unable to state PT Goal Formulation: With patient Time For Goal Achievement: 12/07/21 Potential to Achieve Goals: Good Progress towards PT goals: Progressing toward goals    Frequency    Min 2X/week       PT Plan Discharge plan needs to be updated    Co-evaluation              AM-PAC PT "6 Clicks" Mobility   Outcome Measure  Help needed turning from your back to your side while in a flat bed without using bedrails?: None Help needed moving from lying on your back to sitting on the side of a flat bed without using bedrails?: A Little Help needed moving to and from a bed to a chair (including a wheelchair)?: A Little Help needed standing up from a chair using your arms (e.g., wheelchair or bedside chair)?: A Little Help needed to walk in hospital room?: A Little Help needed climbing 3-5 steps with a railing? : A Lot 6 Click Score: 18    End of Session Equipment Utilized During Treatment: Gait belt Activity Tolerance: Patient tolerated treatment well Patient left: in bed;with bed alarm set Nurse Communication: Mobility status PT Visit Diagnosis: Unsteadiness on feet (R26.81);Muscle weakness (generalized) (M62.81);Difficulty in walking, not elsewhere classified (R26.2)     Time: 2376-2831 PT Time Calculation (min) (ACUTE ONLY): 13 min  Charges:  $Gait Training: 8-22 mins                     Greggory Stallion, PT, DPT, GCS [phone]    Taylin Mans 12/06/2021, 4:36 PM

## 2021-12-06 NOTE — Progress Notes (Signed)
Progress Note   Patient: Corey Todd QBH:[id number] DOB: Feb 14, 1928 DOA: 11/22/2021     13 DOS: the patient was seen and examined on 12/06/2021   Brief hospital course: [name] is a 88 year old male with vascular dementia with nighttime agitation, hypothyroid, constipation, anxiety, history of sundowning, who presents to the emergency department 11/22/2021 from home via EMS for chief concerns of poor p.o. intake for 1 month worse over past 2 days 08/09: sodium 151, creatinine 1.61, GFR 40, Free T4 / TSH was appropriate levels despite not on home medications per wife. COVID by PCR was negative. ED treatment: LR 500 mL bolus.  Admitted to hospitalist service for hyponatremia secondary to dehydration/poor p.o. intake.  Wife is requesting SNF placement, TOC consulted.  Synthroid restarted.  08/10: Sodium WNL at 141, potassium low at 3.0 and was repleted, creatinine improved to 1.31 with GFR 51.  Hgb dropped to 10.9 likely hemodilutional from IV fluids.  Placement pending, following potassium 08/11-08/16: SNF placement pending.  8/17-8/19: Palliative care consult, pending SNF placement.  8/20: Patient remained stable.  Awaiting SNF placement.  8/21: Still no [facility] bed available.  8/22: Still no [facility] bed available.   Assessment and Plan: * Hypernatremia Secondary to dehydration in setting of poor p.o. intake Resolved .  AKI (acute kidney injury) ([place]) Resolved   Protein-calorie malnutrition, moderate ([place]) Estimated body mass index is 24.87 kg/m as calculated from the following:   Height as of this encounter: '5\' 8"'$  (1.727 m).   Weight as of this encounter: 74.2 kg.  Complicates overall prognosis. Dietitian consult  Vascular dementia with behavioral disturbance (HCC) Resumed Seroquel 100 mg at 6 PM daily and ordered Seroquel 50 mg nightly as needed Resumed home lorazepam 0.5 mg p.o. twice daily as needed for anxiety Placement pending for SNF/memory care  Acquired  hypothyroidism Versus sub-clinical TSH on admission was 5.287 Free T4 was within normal limits Per admitting physician's discussion with spouse over the phone, she did not know that her husband was prescribed thyroid medication and she has not given it to him in over a month, may explains why he has been more weak than baseline with poor p.o. intake in over a month, may explain constipation resumed his home hypothyroid medication however his TSH and free T4 is appropriate for his age Follow TSH outpatient  Weakness Multifactorial PT/OT recommends SNF.  TOC working on placement.  Hypokalemia Repleted  Dehydration Resolved  Secondary to poor PO intake Encourage po  Sundowning Resumed home Seroquel 100 mg daily at 6 PM Placement/memory care pending. Safety sitter in place  Constipation Resolved - large BM 08/10 Asymptomatic on evaluation and physical exam Abdominal x-ray concerning for ileus 08/10 --> adjusted bowel regimen to scheduled Synthroid restarted, constipation may also be due to subclinical hypothyroid        Subjective: No new concern.  Physical Exam: Vitals:   12/05/21 1543 12/05/21 1955 12/06/21 0358 12/06/21 0743  BP: 119/69 112/66 (!) 125/59 126/68  Pulse: 74 75 74 72  Resp: '18 20 20 18  '$ Temp: 98.5 F (36.9 C) 97.9 F (36.6 C) 97.8 F (36.6 C) 98 F (36.7 C)  TempSrc:  Oral Axillary Oral  SpO2: 94% 96% 97% 99%  Weight:      Height:       General.  Frail elderly man, in no acute distress. Pulmonary.  Lungs clear bilaterally, normal respiratory effort. CV.  Regular rate and rhythm, no JVD, rub or murmur. Abdomen.  Soft, nontender, nondistended,  BS positive. CNS.    No focal neurologic deficit. Extremities.  No edema, no cyanosis, pulses intact and symmetrical. Psychiatry.  Judgment and insight appears impaired  Data Reviewed: Prior data reviewed  Family Communication: Discussed with wife on phone.  Disposition: Status is: Inpatient Remains  inpatient appropriate because: Unsafe discharge, difficult disposition.   Planned Discharge Destination: Skilled nursing facility  DVT prophylaxis: Lovenox Time spent: 35 minutes   This record has been created using Systems analyst. Errors have been sought and corrected,but may not always be located. Such creation errors do not reflect on the standard of care.  Author: Lorella Nimrod, MD 12/06/2021 2:26 PM  For on call review [email or web address].

## 2021-12-06 NOTE — Progress Notes (Signed)
Nutrition Follow Up Note   DOCUMENTATION CODES:   Non-severe (moderate) malnutrition in context of chronic illness  INTERVENTION:   Ensure Enlive po TID, each supplement provides 350 kcal and 20 grams of protein.  Magic cup TID with meals, each supplement provides 290 kcal and 9 grams of protein  MVI po daily   Vitamin C 538m po BID  Pt at high refeed risk; recommend monitor potassium, magnesium and phosphorus labs daily until stable  NUTRITION DIAGNOSIS:   Moderate Malnutrition related to chronic illness (dementia) as evidenced by moderate fat depletion, severe fat depletion, moderate muscle depletion, severe muscle depletion.  GOAL:   Patient will meet greater than or equal to 90% of their needs -not met   MONITOR:   PO intake, Supplement acceptance, Labs, Weight trends, Skin, I & O's  ASSESSMENT:   88y/o male with h/o dementia, anxiety, hypothyroidism, BPH, HOH, chronic constipation, GERD, HTN, IDA, DVT and hernia repair who is admitted with FTT.  Pt continues to have poor appetite and oral intake in hospital; pt eating anywhere from sips/bites to 100% of small meals. Pt is drinking some Ensure. Pt drank 1/2 of an Ensure for breakfast this morning but declined his evening one. Pt remains at high refeed risk. No new weight since 8/16; pt is ordered for weekly weights. Plan is for SNF at discharge.   Medications reviewed and include: vitamin C, lovenox, synthroid, MVI  Labs reviewed: creat 1.26(H)  Diet Order:   Diet Order             Diet regular Room service appropriate? Yes; Fluid consistency: Thin  Diet effective now                  EDUCATION NEEDS:   Not appropriate for education at this time  Skin:  Skin Assessment: Reviewed RN Assessment (Stage I left and right buttocks, skin tear arm)  Last BM:  8/23- type 6  Height:   Ht Readings from Last 1 Encounters:  11/22/21 _0  (1.727 m)    Weight:   Wt Readings from Last 1 Encounters:   11/29/21 74.2 kg    Ideal Body Weight:  70 kg  BMI:  Body mass index is 24.87 kg/m.  Estimated Nutritional Needs:   Kcal:  1700-1900kcal/day  Protein:  85-95g/day  Fluid:  1.8-2.1L/day  CKoleen DistanceMS, RD, LDN Please refer to [facility]for RD and/or RD on-call/weekend/after hours pager

## 2021-12-07 DIAGNOSIS — E870 Hyperosmolality and hypernatremia: Secondary | ICD-10-CM | POA: Diagnosis not present

## 2021-12-07 LAB — BASIC METABOLIC PANEL
Anion gap: 7 (ref 5–15)
BUN: 34 mg/dL — ABNORMAL HIGH (ref 8–23)
CO2: 21 mmol/L — ABNORMAL LOW (ref 22–32)
Calcium: 8.5 mg/dL — ABNORMAL LOW (ref 8.9–10.3)
Chloride: 117 mmol/L — ABNORMAL HIGH (ref 98–111)
Creatinine, Ser: 1.12 mg/dL (ref 0.61–1.24)
GFR, Estimated: >60 mL/min (ref >60)
Glucose, Bld: 99 mg/dL (ref 70–99)
Potassium: 2.5 mmol/L — CL (ref 3.5–5.1)
Sodium: 145 mmol/L (ref 135–145)

## 2021-12-07 LAB — MAGNESIUM: Magnesium: 2.3 mg/dL (ref 1.7–2.4)

## 2021-12-07 MED ORDER — POTASSIUM CHLORIDE 20 MEQ PO PACK
40.0000 meq | PACK | Freq: Three times a day (TID) | ORAL | Status: AC
  Administered 2021-12-07 (×3): 40 meq via ORAL
  Filled 2021-12-07 (×3): qty 2

## 2021-12-07 NOTE — Assessment & Plan Note (Signed)
   Potassium again at 2.5 which is being repleted.  Magnesium at 2.3

## 2021-12-07 NOTE — Progress Notes (Signed)
Physical Therapy Treatment Patient Details Name: Corey Todd MRN: [id number] DOB: February 27, 1928 Today's Date: 12/07/2021   History of Present Illness [name] is a 88 year old male with vascular dementia with nighttime agitation, hypothyroid, constipation, anxiety, history of sundowning, who presents to the emergency department from home for chief concerns of poor p.o. intake for 2 days.    PT Comments    Pt received in supine position and agreeable to therapy. Pt initially declining mobility, but with encouragement agreed to activity.  Pt performed multiple STS's and when attempting ambulation, had a large BM that required mobility to be stopped and for pt and room to be cleaned.  Nursing assisted with cleaning of the room and pt's gown and linens were doffed and new ones donned before leaving.  Pt resting comfortably in bed upon leaving with call bell within reach and nursing in room still.  Current discharge plans to LTC facility remain appropriate at this time.  Pt will continue to benefit from skilled therapy in order to address deficits listed below.     Recommendations for follow up therapy are one component of a multi-disciplinary discharge planning process, led by the attending physician.  Recommendations may be updated based on patient status, additional functional criteria and insurance authorization.  Follow Up Recommendations  Long-term institutional care without follow-up therapy Can patient physically be transported by private vehicle: Yes   Assistance Recommended at Discharge Frequent or constant Supervision/Assistance  Patient can return home with the following A little help with walking and/or transfers;A lot of help with bathing/dressing/bathroom;Assistance with cooking/housework;Assistance with feeding;Direct supervision/assist for medications management;Direct supervision/assist for financial management;Assist for transportation;Help with stairs or ramp for  entrance   Equipment Recommendations  None recommended by PT    Recommendations for Other Services       Precautions / Restrictions Precautions Precautions: Fall Restrictions Weight Bearing Restrictions: No     Mobility  Bed Mobility Overal bed mobility: Needs Assistance Bed Mobility: Sit to Supine       Sit to supine: Min guard   General bed mobility comments: safe technique, needs heavy cues for commands    Transfers Overall transfer level: Needs assistance Equipment used: Rolling walker (2 wheels) Transfers: Sit to/from Stand Sit to Stand: Min assist           General transfer comment: forward flexed posture    Ambulation/Gait Ambulation/Gait assistance: Min assist Gait Distance (Feet): 40 Feet Assistive device: Rolling walker (2 wheels) Gait Pattern/deviations: Step-through pattern Gait velocity: decreased     General Gait Details: ambulated limited to room due to BM on the floor.   Stairs             Wheelchair Mobility    Modified Rankin (Stroke Patients Only)       Balance Overall balance assessment: Needs assistance Sitting-balance support: Feet supported Sitting balance-Leahy Scale: Good     Standing balance support: Bilateral upper extremity supported Standing balance-Leahy Scale: Fair                              Cognition Arousal/Alertness: Lethargic Behavior During Therapy: Flat affect Overall Cognitive Status: History of cognitive impairments - at baseline                           Safety/Judgement: Decreased awareness of safety   Problem Solving: Difficulty sequencing, Slow processing, Requires verbal cues, Requires tactile cues General  Comments: Pt with very slow cognition and difficulty hearing/processing commands.        Exercises      General Comments        Pertinent Vitals/Pain Pain Assessment Pain Assessment: No/denies pain    Home Living                           Prior Function            PT Goals (current goals can now be found in the care plan section) Acute Rehab PT Goals Patient Stated Goal: unable to state PT Goal Formulation: With patient Time For Goal Achievement: 12/07/21 Potential to Achieve Goals: Good Progress towards PT goals: Progressing toward goals    Frequency    Min 2X/week      PT Plan Discharge plan needs to be updated    Co-evaluation              AM-PAC PT "6 Clicks" Mobility   Outcome Measure  Help needed turning from your back to your side while in a flat bed without using bedrails?: None Help needed moving from lying on your back to sitting on the side of a flat bed without using bedrails?: A Little Help needed moving to and from a bed to a chair (including a wheelchair)?: A Little Help needed standing up from a chair using your arms (e.g., wheelchair or bedside chair)?: A Little Help needed to walk in hospital room?: A Little Help needed climbing 3-5 steps with a railing? : A Lot 6 Click Score: 18    End of Session Equipment Utilized During Treatment: Gait belt Activity Tolerance: Patient tolerated treatment well Patient left: in bed;with bed alarm set Nurse Communication: Mobility status PT Visit Diagnosis: Unsteadiness on feet (R26.81);Muscle weakness (generalized) (M62.81);Difficulty in walking, not elsewhere classified (R26.2)     Time: 6378-5885 PT Time Calculation (min) (ACUTE ONLY): 31 min  Charges:  $Therapeutic Activity: 23-37 mins                     Gwenlyn Saran, PT, DPT 12/07/21, 3:48 PM

## 2021-12-07 NOTE — Plan of Care (Signed)

## 2021-12-07 NOTE — Progress Notes (Signed)
Progress Note   Patient: Corey Todd CWC:[id number] DOB: 1927-07-30 DOA: 11/22/2021     14 DOS: the patient was seen and examined on 12/07/2021   Brief hospital course: Mr. Carmine Carrozza is a 88 year old male with vascular dementia with nighttime agitation, hypothyroid, constipation, anxiety, history of sundowning, who presents to the emergency department 11/22/2021 from home via EMS for chief concerns of poor p.o. intake for 1 month worse over past 2 days . 08/09: sodium 151, creatinine 1.61, GFR 40, Free T4 / TSH was appropriate levels despite not on home medications per wife. COVID by PCR was negative. ED treatment: LR 500 mL bolus.  Admitted to hospitalist service for hyponatremia secondary to dehydration/poor p.o. intake.  Wife is requesting SNF placement, TOC consulted.  Synthroid restarted.  Marland Kitchen 08/10: Sodium WNL at 141, potassium low at 3.0 and was repleted, creatinine improved to 1.31 with GFR 51.  Hgb dropped to 10.9 likely hemodilutional from IV fluids.  Placement pending, following potassium . 08/11-08/16: SNF placement pending.  . 8/17-8/19: Palliative care consult, pending SNF placement.  8/20: Patient remained stable.  Awaiting SNF placement.  8/21: Still no [facility] approved bed available.  8/22: Still no VA bed available.  8/24: Still no VA bed availability.  Repeat BMP today with hypokalemia, magnesium 2.3.  Potassium is being repleted. Patient refusing most of the food except ice cream. Told nursing staff to try Ensure or boost.   Assessment and Plan: * Hypernatremia Secondary to dehydration in setting of poor p.o. intake Resolved .  AKI (acute kidney injury) ([place]) Resolved   Protein-calorie malnutrition, moderate ([place]) Estimated body mass index is 24.87 kg/m as calculated from the following:   Height as of this encounter: '5\' 8"'$  (1.727 m).    Weight as of this encounter: 74.2 kg.    Complicates overall prognosis.  Dietitian consult  Vascular dementia with  behavioral disturbance (HCC)  Resumed Seroquel 100 mg at 6 PM daily and ordered Seroquel 50 mg nightly as needed  Resumed home lorazepam 0.5 mg p.o. twice daily as needed for anxiety  Placement pending for SNF/memory care  Acquired hypothyroidism  Versus sub-clinical  TSH on admission was 5.287 Free T4 was within normal limits  Per admitting physician's discussion with spouse over the phone, she did not know that her husband was prescribed thyroid medication and she has not given it to him in over a month, may explains why he has been more weak than baseline with poor p.o. intake in over a month, may explain constipation  resumed his home hypothyroid medication however his TSH and free T4 is appropriate for his age  Follow TSH outpatient  Weakness  Multifactorial  PT/OT recommends SNF.  TOC working on placement.  Hypokalemia  Potassium again at 2.5 which is being repleted.  Magnesium at 2.3  Dehydration Resolved  Secondary to poor PO intake  Encourage po  Sundowning  Resumed home Seroquel 100 mg daily at 6 PM  Placement/memory care pending.  Safety sitter in place  Constipation Resolved - large BM 08/10  Asymptomatic on evaluation and physical exam  Abdominal x-ray concerning for ileus 08/10 --> adjusted bowel regimen to scheduled  Synthroid restarted, constipation may also be due to subclinical hypothyroid        Subjective: Patient was little more alert when seen today.  He was refusing most of the care.  Denies any pain.  Physical Exam: Vitals:   12/06/21 1634 12/06/21 1950 12/07/21 0604 12/07/21 0752  BP: 136/72 130/72 101/64  123/65  Pulse: 81 88 68 70  Resp: '16 16 16 18  '$ Temp: 98.3 F (36.8 C) 97.6 F (36.4 C) 98.2 F (36.8 C) 98.6 F (37 C)  TempSrc: Oral Oral Oral Oral  SpO2: 97% 100% 98% 96%  Weight:      Height:       General.  Ill-appearing elderly man, in no acute distress. Pulmonary.  Lungs clear bilaterally, normal  respiratory effort. CV.  Regular rate and rhythm, no JVD, rub or murmur. Abdomen.  Soft, nontender, nondistended, BS positive. CNS.  Alert .  No focal neurologic deficit. Extremities.  No edema, no cyanosis, pulses intact and symmetrical. Psychiatry.  Judgment and insight appears impaired  Data Reviewed: Prior data reviewed  Family Communication:   Disposition: Status is: Inpatient Remains inpatient appropriate because: Awaiting disposition   Planned Discharge Destination: Skilled nursing facility  DVT prophylaxis.  Lovenox Time spent: 40 minutes  This record has been created using Systems analyst. Errors have been sought and corrected,but may not always be located. Such creation errors do not reflect on the standard of care.  Author: Lorella Nimrod, MD 12/07/2021 3:20 PM  For on call review [email or web address].

## 2021-12-07 NOTE — TOC Progression Note (Signed)
Transition of Care [facility]) - Progression Note    Patient Details  Name: Corey Todd MRN: [id number] Date of Birth: 12/10/1927  [facility]) CM/SW Contact  Beverly Sessions, RN Phone Number: 12/07/2021, 3:38 PM  Clinical Narrative:      No SNF bed offers.  Danielle with care patrol to follow up with wife.  Wife updated       Expected Discharge Plan and Services                                                 Social Determinants of Health (SDOH) Interventions    Readmission Risk Interventions     No data to display

## 2021-12-08 DIAGNOSIS — E870 Hyperosmolality and hypernatremia: Secondary | ICD-10-CM | POA: Diagnosis not present

## 2021-12-08 LAB — BASIC METABOLIC PANEL
Anion gap: 5 (ref 5–15)
BUN: 35 mg/dL — ABNORMAL HIGH (ref 8–23)
CO2: 22 mmol/L (ref 22–32)
Calcium: 8.3 mg/dL — ABNORMAL LOW (ref 8.9–10.3)
Chloride: 120 mmol/L — ABNORMAL HIGH (ref 98–111)
Creatinine, Ser: 1.1 mg/dL (ref 0.61–1.24)
GFR, Estimated: >60 mL/min (ref >60)
Glucose, Bld: 93 mg/dL (ref 70–99)
Potassium: 2.5 mmol/L — CL (ref 3.5–5.1)
Sodium: 147 mmol/L — ABNORMAL HIGH (ref 135–145)

## 2021-12-08 MED ORDER — POTASSIUM CHLORIDE 20 MEQ PO PACK
40.0000 meq | PACK | Freq: Three times a day (TID) | ORAL | Status: AC
  Administered 2021-12-08 (×2): 40 meq via ORAL
  Filled 2021-12-08 (×2): qty 2

## 2021-12-08 MED ORDER — POTASSIUM CHLORIDE 2 MEQ/ML IV SOLN
INTRAVENOUS | Status: DC
  Filled 2021-12-08 (×6): qty 1000

## 2021-12-08 NOTE — Assessment & Plan Note (Signed)
Secondary to dehydration in setting of poor p.o. intake.  Sodium started trending up again today, at 147 -Give him some D5. -1 and her sodium

## 2021-12-08 NOTE — Progress Notes (Signed)
Mobility Specialist - Progress Note   12/08/21 1512  Mobility  Activity Ambulated with assistance in hallway  Level of Assistance Standby assist, set-up cues, supervision of patient - no hands on  Assistive Device Front wheel walker  Distance Ambulated (ft) 80 ft  Activity Response Tolerated well  $Mobility charge 1 Mobility     Pt agreeable to activity. Task initiation required but sat EOB and stood with supervision. Ambulated in hallway with supervision-CGA d/t far forward flex posture. Inconsistent with following single-step commands. Incontinent loose stool during ambulation, limiting further activity. Pt returned to room for hygiene care and gown change. Pt left in bed with alarm set, needs in reach.    Kathee Delton Mobility Specialist 12/08/21, 3:36 PM

## 2021-12-08 NOTE — TOC Progression Note (Signed)
Transition of Care [facility]) - Progression Note    Patient Details  Name: Corey Todd MRN: [id number] Date of Birth: 1927/10/01  Transition of Care [facility]) CM/SW [place], LCSW Phone Number: 12/08/2021, 2:19 PM  Clinical Narrative:    [place] [facility]- Left VM for [facility] in Admissions.   [place]- Left VM with Receptionist Otila Kluver for [place] in Admissions.   [place]- Attempted to reach Admissions, was transferred by Receptionist but mailbox was full.  [place] to Admissions Worker [place]. She stated they do have [place] beds. Referral faxed to [facility] at [phone]. Lexine Baton stated she will need to have it reviewed by Nursing which would not occur until Monday, provided RNCM call back number for follow up on Monday.        Expected Discharge Plan and Services                                                 Social Determinants of Health (SDOH) Interventions    Readmission Risk Interventions     No data to display

## 2021-12-08 NOTE — Progress Notes (Signed)
Progress Note   Patient: Corey Todd [id number]:[id number] DOB: October 26, 1927 DOA: 11/22/2021     15 DOS: the patient was seen and examined on 12/08/2021   Brief hospital course: Corey Todd is a 88 year old male with vascular dementia with nighttime agitation, hypothyroid, constipation, anxiety, history of sundowning, who presents to the emergency department 11/22/2021 from home via EMS for chief concerns of poor p.o. intake for 1 month worse over past 2 days . 08/09: sodium 151, creatinine 1.61, GFR 40, Free T4 / TSH was appropriate levels despite not on home medications per wife. COVID by PCR was negative. ED treatment: LR 500 mL bolus.  Admitted to hospitalist service for hyponatremia secondary to dehydration/poor p.o. intake.  Wife is requesting SNF placement, TOC consulted.  Synthroid restarted.  Marland Kitchen 08/10: Sodium WNL at 141, potassium low at 3.0 and was repleted, creatinine improved to 1.31 with GFR 51.  Hgb dropped to 10.9 likely hemodilutional from IV fluids.  Placement pending, following potassium . 08/11-08/16: SNF placement pending.  . 8/17-8/19: Palliative care consult, pending SNF placement.  8/20: Patient remained stable.  Awaiting SNF placement.  8/21: Still no VA approved bed available.  8/22: Still no VA bed available.  8/24: Still no VA bed availability.  Repeat BMP today with hypokalemia, magnesium 2.3.  Potassium is being repleted. Patient refusing most of the food except ice cream. Told nursing staff to try Ensure or boost.  8/25: Patient refusing most of the p.o. intake.  Potassium remains low despite replacement.  Sodium started to trend up at 147 today. Likely secondary to poor p.o. intake. Another message sent to palliative care to revisit. We will try giving him some IV. If continue to refuse he will be appropriate for hospice. Tried reaching wife with no success today.   Assessment and Plan: * Hypernatremia Secondary to dehydration in setting of poor p.o.  intake.  Sodium started trending up again today, at 147 -Give him some D5. -1 and her sodium  AKI (acute kidney injury) ([place]) Resolved   Protein-calorie malnutrition, moderate ([place]) Estimated body mass index is 24.87 kg/m as calculated from the following:   Height as of this encounter: '5\' 8"'$  (1.727 m).   Weight as of this encounter: 74.2 kg.    Complicates overall prognosis.  Dietitian consult  Patient refusing most of the p.o. intake  Vascular dementia with behavioral disturbance (HCC)  Resumed Seroquel 100 mg at 6 PM daily and ordered Seroquel 50 mg nightly as needed  Resumed home lorazepam 0.5 mg p.o. twice daily as needed for anxiety  Placement pending for SNF/memory care  Acquired hypothyroidism  Versus sub-clinical  TSH on admission was 5.287 Free T4 was within normal limits  Per admitting physician's discussion with spouse over the phone, she did not know that her husband was prescribed thyroid medication and she has not given it to him in over a month, may explains why he has been more weak than baseline with poor p.o. intake in over a month, may explain constipation  resumed his home hypothyroid medication however his TSH and free T4 is appropriate for his age  Follow TSH outpatient  Weakness  Multifactorial  PT/OT recommends SNF.  TOC working on placement.  Hypokalemia  Potassium again at 2.5 ,Magnesium at 2.3  No change in potassium despite ordering replacement yesterday.  -Ordered IV and p.o. replacement.  -Continue to monitor  Dehydration Secondary to poor PO intake  Encourage po  Giving some IV fluid  Sundowning  Resumed home  Seroquel 100 mg daily at 6 PM  Placement/memory care pending.  Safety sitter in place  Constipation Resolved - large BM 08/10  Asymptomatic on evaluation and physical exam  Abdominal x-ray concerning for ileus 08/10 --> adjusted bowel regimen to scheduled  Synthroid restarted, constipation may also be  due to subclinical hypothyroid        Subjective: Patient was awake but appears little lethargic when seen today.  Refusing all the food and meds.  He will try to push you away if you go closer or try to do exam.  Physical Exam: Vitals:   12/07/21 0604 12/07/21 0752 12/07/21 1626 12/08/21 0414  BP: 101/64 123/65 (!) 142/105 129/68  Pulse: 68 70 78 72  Resp: '16 18 18 17  '$ Temp: 98.2 F (36.8 C) 98.6 F (37 C) 98.1 F (36.7 C) 97.8 F (36.6 C)  TempSrc: Oral Oral  Oral  SpO2: 98% 96% 94% 100%  Weight:      Height:       General.  Lethargic elderly man, in no acute distress. Pulmonary.  Lungs clear bilaterally, normal respiratory effort. CV.  Regular rate and rhythm, no JVD, rub or murmur. Abdomen.  Soft, nontender, nondistended, BS positive. CNS.  Awake but not much cooperative today Extremities.  No edema, no cyanosis, pulses intact and symmetrical. Psychiatry.  Judgment and insight appears impaired.  Data Reviewed: Prior data reviewed  Family Communication: Called wife with no response  Disposition: Status is: Inpatient Remains inpatient appropriate because: Unsafe discharge, no bed offer yet.   Planned Discharge Destination: Skilled nursing facility  DVT prophylaxis.  Lovenox Time spent: 45 minutes  This record has been created using Systems analyst. Errors have been sought and corrected,but may not always be located. Such creation errors do not reflect on the standard of care.  Author: Lorella Nimrod, MD 12/08/2021 2:59 PM  For on call review [email or web address].

## 2021-12-08 NOTE — Assessment & Plan Note (Signed)
Secondary to poor PO intake  Encourage po  Giving some IV fluid

## 2021-12-08 NOTE — Assessment & Plan Note (Signed)
Estimated body mass index is 24.87 kg/m as calculated from the following:   Height as of this encounter: '5\' 8"'$  (1.727 m).   Weight as of this encounter: 74.2 kg.    Complicates overall prognosis.  Dietitian consult  Patient refusing most of the p.o. intake

## 2021-12-08 NOTE — Assessment & Plan Note (Signed)
   Potassium again at 2.5 ,Magnesium at 2.3  No change in potassium despite ordering replacement yesterday.  -Ordered IV and p.o. replacement.  -Continue to monitor

## 2021-12-09 DIAGNOSIS — E870 Hyperosmolality and hypernatremia: Secondary | ICD-10-CM | POA: Diagnosis not present

## 2021-12-09 LAB — BASIC METABOLIC PANEL
Anion gap: 5 (ref 5–15)
BUN: 29 mg/dL — ABNORMAL HIGH (ref 8–23)
CO2: 20 mmol/L — ABNORMAL LOW (ref 22–32)
Calcium: 7.9 mg/dL — ABNORMAL LOW (ref 8.9–10.3)
Chloride: 115 mmol/L — ABNORMAL HIGH (ref 98–111)
Creatinine, Ser: 1.21 mg/dL (ref 0.61–1.24)
GFR, Estimated: 56 mL/min — ABNORMAL LOW (ref >60)
Glucose, Bld: 105 mg/dL — ABNORMAL HIGH (ref 70–99)
Potassium: 2.6 mmol/L — CL (ref 3.5–5.1)
Sodium: 140 mmol/L (ref 135–145)

## 2021-12-09 MED ORDER — POTASSIUM CHLORIDE 10 MEQ/100ML IV SOLN
10.0000 meq | INTRAVENOUS | Status: AC
  Administered 2021-12-09 (×6): 10 meq via INTRAVENOUS
  Filled 2021-12-09 (×6): qty 100

## 2021-12-09 NOTE — Assessment & Plan Note (Signed)
Resolved

## 2021-12-09 NOTE — Progress Notes (Signed)
Progress Note   Patient: Corey Todd [id number] DOB: March 07, 1928 DOA: 11/22/2021     16 DOS: the patient was seen and examined on 12/09/2021   Brief hospital course: Mr. [name] is a 88 year old male with vascular dementia with nighttime agitation, hypothyroid, constipation, anxiety, history of sundowning, who presents to the emergency department 11/22/2021 from home via EMS for chief concerns of poor p.o. intake for 1 month worse over past 2 days . 08/09: sodium 151, creatinine 1.61, GFR 40, Free T4 / TSH was appropriate levels despite not on home medications per wife. COVID by PCR was negative. ED treatment: LR 500 mL bolus.  Admitted to hospitalist service for hyponatremia secondary to dehydration/poor p.o. intake.  Wife is requesting SNF placement, TOC consulted.  Synthroid restarted.  Marland Kitchen 08/10: Sodium WNL at 141, potassium low at 3.0 and was repleted, creatinine improved to 1.31 with GFR 51.  Hgb dropped to 10.9 likely hemodilutional from IV fluids.  Placement pending, following potassium . 08/11-08/16: SNF placement pending.  . 8/17-8/19: Palliative care consult, pending SNF placement.  8/20: Patient remained stable.  Awaiting SNF placement.  8/21: Still no VA approved bed available.  8/22: Still no VA bed available.  8/24: Still no VA bed availability.  Repeat BMP today with hypokalemia, magnesium 2.3.  Potassium is being repleted. Patient refusing most of the food except ice cream. Told nursing staff to try Ensure or boost.  8/25: Patient refusing most of the p.o. intake.  Potassium remains low despite replacement.  Sodium started to trend up at 147 today. Likely secondary to poor p.o. intake. Another message sent to palliative care to revisit. We will try giving him some IV. If continue to refuse he will be appropriate for hospice.  8/26: Patient continued to refuse most of the p.o. intake.  Potassium remains low, he was refusing most of the replacement yesterday.   Ordered IV potassium. Had a long discussion with wife yesterday as he might need to go to hospice for failure to thrive, if he continues to refuse p.o. intake and most of the care.  She will discuss with her kids and let us know about their decision in 1 to 2 days.    Assessment and Plan: * Hypernatremia Improved with IV fluid. -Continue IV fluids with D5   AKI (acute kidney injury) ([place]) Resolved   Protein-calorie malnutrition, moderate (HCC) Estimated body mass index is 24.87 kg/m as calculated from the following:   Height as of this encounter: '5\' 8"'$  (1.727 m).   Weight as of this encounter: 74.2 kg.    Complicates overall prognosis.  Dietitian consult  Patient refusing most of the p.o. intake  Vascular dementia with behavioral disturbance (HCC)  Resumed Seroquel 100 mg at 6 PM daily and ordered Seroquel 50 mg nightly as needed  Resumed home lorazepam 0.5 mg p.o. twice daily as needed for anxiety  Placement pending for SNF/memory care  Acquired hypothyroidism  Versus sub-clinical  TSH on admission was 5.287 Free T4 was within normal limits  Per admitting physician's discussion with spouse over the phone, she did not know that her husband was prescribed thyroid medication and she has not given it to him in over a month, may explains why he has been more weak than baseline with poor p.o. intake in over a month, may explain constipation  resumed his home hypothyroid medication however his TSH and free T4 is appropriate for his age  Follow TSH outpatient  Weakness  Multifactorial  PT/OT recommends  SNF.  TOC working on placement.  Hypokalemia  Persistent hyponatremia.  Patient was refusing to take p.o. replacement yesterday.  -Replete potassium with IV x6  Continue to monitor  Dehydration Secondary to poor PO intake  Encourage po  Giving some IV fluid  Sundowning  Resumed home Seroquel 100 mg daily at 6 PM  Placement/memory care  pending.  Safety sitter in place  Constipation Resolved - large BM 08/10  Asymptomatic on evaluation and physical exam  Abdominal x-ray concerning for ileus 08/10 --> adjusted bowel regimen to scheduled  Synthroid restarted, constipation may also be due to subclinical hypothyroid        Subjective: Patient was seen and examined today.  Refusing most of the p.o. intake.  Agrees to eat some ice cream when asked today.  Denies any pain  Physical Exam: Vitals:   12/08/21 2009 12/09/21 0455 12/09/21 0745 12/09/21 1625  BP: 132/81 (!) 132/57 127/65 (!) 120/98  Pulse: 72 65 65 73  Resp: '18 20 18 16  '$ Temp: 97.9 F (36.6 C) 97.7 F (36.5 C) (!) 97.5 F (36.4 C) 97.8 F (36.6 C)  TempSrc: Oral Oral Oral   SpO2: 99% 100% 100% 99%  Weight:      Height:       General.  Ill-appearing elderly man, in no acute distress. Pulmonary.  Lungs clear bilaterally, normal respiratory effort. CV.  Regular rate and rhythm, no JVD, rub or murmur. Abdomen.  Soft, nontender, nondistended, BS positive. CNS.  Alert and oriented .  No focal neurologic deficit. Extremities.  No edema, no cyanosis, pulses intact and symmetrical. Psychiatry.  Judgment and insight appears normal.  Data Reviewed: Data reviewed  Family Communication: Wife on phone  Disposition: Status is: Inpatient Remains inpatient appropriate because: Unsafe discharge, no bed offer yet.  Need long-term placement   Planned Discharge Destination: Skilled nursing facility  DVT prophylaxis.  Lovenox Time spent: 40 minutes  This record has been created using Systems analyst. Errors have been sought and corrected,but may not always be located. Such creation errors do not reflect on the standard of care.  Author: Lorella Nimrod, MD 12/09/2021 4:36 PM  For on call review [email or web address].

## 2021-12-09 NOTE — Assessment & Plan Note (Signed)
   Persistent hyponatremia.  Patient was refusing to take p.o. replacement yesterday.  -Replete potassium with IV x6  Continue to monitor

## 2021-12-09 NOTE — Assessment & Plan Note (Signed)
Improved with IV fluid. -Continue IV fluids with D5

## 2021-12-10 DIAGNOSIS — E870 Hyperosmolality and hypernatremia: Secondary | ICD-10-CM | POA: Diagnosis not present

## 2021-12-10 LAB — BASIC METABOLIC PANEL
Anion gap: 4 — ABNORMAL LOW (ref 5–15)
BUN: 22 mg/dL (ref 8–23)
CO2: 21 mmol/L — ABNORMAL LOW (ref 22–32)
Calcium: 8.2 mg/dL — ABNORMAL LOW (ref 8.9–10.3)
Chloride: 117 mmol/L — ABNORMAL HIGH (ref 98–111)
Creatinine, Ser: 1.15 mg/dL (ref 0.61–1.24)
GFR, Estimated: 60 mL/min — ABNORMAL LOW (ref >60)
Glucose, Bld: 95 mg/dL (ref 70–99)
Potassium: 3.3 mmol/L — ABNORMAL LOW (ref 3.5–5.1)
Sodium: 142 mmol/L (ref 135–145)

## 2021-12-10 MED ORDER — POTASSIUM CHLORIDE 10 MEQ/100ML IV SOLN
10.0000 meq | INTRAVENOUS | Status: AC
  Administered 2021-12-10 (×4): 10 meq via INTRAVENOUS
  Filled 2021-12-10: qty 100

## 2021-12-10 MED ORDER — DEXTROSE IN LACTATED RINGERS 5 % IV SOLN: INTRAVENOUS | Status: DC

## 2021-12-10 NOTE — Assessment & Plan Note (Signed)
Improved with IV fluid. -Continue IV fluids with D5

## 2021-12-10 NOTE — Assessment & Plan Note (Signed)
Estimated body mass index is 24.87 kg/m as calculated from the following:   Height as of this encounter: '5\' 8"'$  (1.727 m).   Weight as of this encounter: 74.2 kg.    Complicates overall prognosis.  Dietitian consult  Patient refusing most of the p.o. intake

## 2021-12-10 NOTE — TOC Progression Note (Signed)
Transition of Care [facility]) - Progression Note    Patient Details  Name: Corey Todd MRN: [id number] Date of Birth: [birth year]  Transition of Care [facility]) CM/SW [place], LCSW Phone Number: 12/10/2021, 12:30 PM  Clinical Narrative:    Reached out to Benjamine Mola with [facility] per MD request to inquire if patient would qualify for hospice home.         Expected Discharge Plan and Services                                                 Social Determinants of Health (SDOH) Interventions    Readmission Risk Interventions     No data to display

## 2021-12-10 NOTE — Progress Notes (Signed)
Progress Note   Patient: Corey Todd OBS:[id number] DOB: Jan 24, 1928 DOA: 11/22/2021     17 DOS: the patient was seen and examined on 12/10/2021   Brief hospital course: Mr. Malone Vanblarcom is a 88 year old male with vascular dementia with nighttime agitation, hypothyroid, constipation, anxiety, history of sundowning, who presents to the emergency department 11/22/2021 from home via EMS for chief concerns of poor p.o. intake for 1 month worse over past 2 days . 08/09: sodium 151, creatinine 1.61, GFR 40, Free T4 / TSH was appropriate levels despite not on home medications per wife. COVID by PCR was negative. ED treatment: LR 500 mL bolus.  Admitted to hospitalist service for hyponatremia secondary to dehydration/poor p.o. intake.  Wife is requesting SNF placement, TOC consulted.  Synthroid restarted.  Marland Kitchen 08/10: Sodium WNL at 141, potassium low at 3.0 and was repleted, creatinine improved to 1.31 with GFR 51.  Hgb dropped to 10.9 likely hemodilutional from IV fluids.  Placement pending, following potassium . 08/11-08/16: SNF placement pending.  . 8/17-8/19: Palliative care consult, pending SNF placement.  8/20: Patient remained stable.  Awaiting SNF placement.  8/21: Still no [facility] approved bed available.  8/22: Still no VA bed available.  8/24: Still no VA bed availability.  Repeat BMP today with hypokalemia, magnesium 2.3.  Potassium is being repleted. Patient refusing most of the food except ice cream. Told nursing staff to try Ensure or boost.  8/25: Patient refusing most of the p.o. intake.  Potassium remains low despite replacement.  Sodium started to trend up at 147 today. Likely secondary to poor p.o. intake. Another message sent to palliative care to revisit. We will try giving him some IV. If continue to refuse he will be appropriate for hospice.  8/26: Patient continued to refuse most of the p.o. intake.  Potassium remains low, he was refusing most of the replacement yesterday.   Ordered IV potassium. Had a long discussion with wife yesterday as he might need to go to hospice for failure to thrive, if he continues to refuse p.o. intake and most of the care.  She will discuss with her kids and let us know about their decision in 1 to 2 days.  8/27: Patient continued to refuse most of the p.o. intake, drinking couple of sips of boost or 1-2 bites of ice cream only. Discussed with son and family wants him to get evaluated for hospice home.  TOC to send the referral. Palliative care was consulted earlier and should be able to see him tomorrow. Potassium with some improvement but still low-ordered more replacement.  Continuing IV fluid at this time as family has not decided about full comfort only.   Assessment and Plan: * Hypernatremia Improved with IV fluid. -Continue IV fluids with D5   AKI (acute kidney injury) ([place]) Resolved   Protein-calorie malnutrition, moderate (HCC) Estimated body mass index is 24.87 kg/m as calculated from the following:   Height as of this encounter: '5\' 8"'$  (1.727 m).   Weight as of this encounter: 74.2 kg.    Complicates overall prognosis.  Dietitian consult  Patient refusing most of the p.o. intake  Vascular dementia with behavioral disturbance (HCC)  Resumed Seroquel 100 mg at 6 PM daily and ordered Seroquel 50 mg nightly as needed  Resumed home lorazepam 0.5 mg p.o. twice daily as needed for anxiety  Placement pending for SNF/memory care  Acquired hypothyroidism  Versus sub-clinical  TSH on admission was 5.287 Free T4 was within normal limits  Per  admitting physician's discussion with spouse over the phone, she did not know that her husband was prescribed thyroid medication and she has not given it to him in over a month, may explains why he has been more weak than baseline with poor p.o. intake in over a month, may explain constipation  resumed his home hypothyroid medication however his TSH and free T4 is  appropriate for his age  Follow TSH outpatient  Weakness  Multifactorial  PT/OT recommends SNF.  TOC working on placement.  Hypokalemia  Some improvement to potassium, at 3.3 this morning after IV replacement.  -Ordered 4 more bags of potassium.  Continue to monitor  Dehydration Secondary to poor PO intake  Encourage po  Giving some IV fluid  Sundowning  Resumed home Seroquel 100 mg daily at 6 PM  Placement/memory care pending.  Safety sitter in place  Constipation Resolved - large BM 08/10  Asymptomatic on evaluation and physical exam  Abdominal x-ray concerning for ileus 08/10 --> adjusted bowel regimen to scheduled  Synthroid restarted, constipation may also be due to subclinical hypothyroid        Subjective: Patient was seen and examined today.  No new concerns.  Still refusing most of the p.o. intake.  Physical Exam: Vitals:   12/09/21 0745 12/09/21 1625 12/09/21 1922 12/10/21 0830  BP: 127/65 (!) 120/98 123/76 (!) 156/88  Pulse: 65 73 70 67  Resp: '18 16 17 18  '$ Temp: (!) 97.5 F (36.4 C) 97.8 F (36.6 C) 97.8 F (36.6 C) 98 F (36.7 C)  TempSrc: Oral  Oral Oral  SpO2: 100% 99% 98% 96%  Weight:      Height:       General.  Frail elderly man, in no acute distress. Pulmonary.  Lungs clear bilaterally, normal respiratory effort. CV.  Regular rate and rhythm, no JVD, rub or murmur. Abdomen.  Soft, nontender, nondistended, BS positive. CNS.  Alert and oriented to self.  No focal neurologic deficit. Extremities.  No edema, no cyanosis, pulses intact and symmetrical. Psychiatry.  Judgment and insight appears impaired.  Data Reviewed: Prior data reviewed  Family Communication: Discussed with son  Disposition: Status is: Inpatient Remains inpatient appropriate because: Unsafe discharge, still no VA approved bed offer for long-term care.  Might qualify for hospice home   Planned Discharge Destination: Skilled nursing facility  DVT  prophylaxis.  Lovenox Time spent: 40 minutes  This record has been created using Systems analyst. Errors have been sought and corrected,but may not always be located. Such creation errors do not reflect on the standard of care.  Author: Lorella Nimrod, MD 12/10/2021 12:51 PM  For on call review [email or web address].

## 2021-12-10 NOTE — Assessment & Plan Note (Signed)
   Some improvement to potassium, at 3.3 this morning after IV replacement.  -Ordered 4 more bags of potassium.  Continue to monitor

## 2021-12-11 DIAGNOSIS — E870 Hyperosmolality and hypernatremia: Secondary | ICD-10-CM | POA: Diagnosis not present

## 2021-12-11 NOTE — Progress Notes (Signed)
Manufacturing [employer] [facility]) hospital Liaison note:  Notified by [employer] of family interest in [facility] home in [place]. Chart notes reviewed with hospice physician Dr. Lyman Speller. At this time patient does not appear Hospice Home appropriate.  Patient would be eligible for hospice services in his home if the family chooses. Hospital team updated.  Thank you for this referral.  Flo Shanks BSN, RN, [facility] [phone]

## 2021-12-11 NOTE — Assessment & Plan Note (Signed)
Improved with IV fluid. -Stop IV fluid and monitor

## 2021-12-11 NOTE — Progress Notes (Signed)
Progress Note   Patient: Corey Todd KWI:[id number] DOB: 01-12-1928 DOA: 11/22/2021     18 DOS: the patient was seen and examined on 12/11/2021   Brief hospital course: [name] is a 88 year old male with vascular dementia with nighttime agitation, hypothyroid, constipation, anxiety, history of sundowning, who presents to the emergency department 11/22/2021 from home via EMS for chief concerns of poor p.o. intake for 1 month worse over past 2 days . 08/09: sodium 151, creatinine 1.61, GFR 40, Free T4 / TSH was appropriate levels despite not on home medications per wife. COVID by PCR was negative. ED treatment: LR 500 mL bolus.  Admitted to hospitalist service for hyponatremia secondary to dehydration/poor p.o. intake.  Wife is requesting SNF placement, TOC consulted.  Synthroid restarted.  Marland Kitchen 08/10: Sodium WNL at 141, potassium low at 3.0 and was repleted, creatinine improved to 1.31 with GFR 51.  Hgb dropped to 10.9 likely hemodilutional from IV fluids.  Placement pending, following potassium . 08/11-08/16: SNF placement pending.  . 8/17-8/19: Palliative care consult, pending SNF placement.  8/20: Patient remained stable.  Awaiting SNF placement.  8/21: Still no VA approved bed available.  8/22: Still no VA bed available.  8/24: Still no VA bed availability.  Repeat BMP today with hypokalemia, magnesium 2.3.  Potassium is being repleted. Patient refusing most of the food except ice cream. Told nursing staff to try Ensure or boost.  8/25: Patient refusing most of the p.o. intake.  Potassium remains low despite replacement.  Sodium started to trend up at 147 today. Likely secondary to poor p.o. intake. Another message sent to palliative care to revisit. We will try giving him some IV. If continue to refuse he will be appropriate for hospice.  8/26: Patient continued to refuse most of the p.o. intake.  Potassium remains low, he was refusing most of the replacement yesterday.   Ordered IV potassium. Had a long discussion with wife yesterday as he might need to go to hospice for failure to thrive, if he continues to refuse p.o. intake and most of the care.  She will discuss with her kids and let us know about their decision in 1 to 2 days.  8/27: Patient continued to refuse most of the p.o. intake, drinking couple of sips of boost or 1-2 bites of ice cream only. Discussed with son and family wants him to get evaluated for hospice home.  TOC to send the referral. Palliative care was consulted earlier and should be able to see him tomorrow. Potassium with some improvement but still low-ordered more replacement.  Continuing IV fluid at this time as family has not decided about full comfort only.  8/28: Patient remained the same.  Intermittently refusing p.o. intake and taking some time.  Discontinuing IV fluid. He does not qualify for hospice home.  Still no bed offer.   Assessment and Plan: * Hypernatremia Improved with IV fluid. -Stop IV fluid and monitor   AKI (acute kidney injury) ([place]) Resolved   Protein-calorie malnutrition, moderate (HCC) Estimated body mass index is 24.87 kg/m as calculated from the following:   Height as of this encounter: '5\' 8"'$  (1.727 m).   Weight as of this encounter: 74.2 kg.    Complicates overall prognosis.  Dietitian consult  Patient refusing most of the p.o. intake  Vascular dementia with behavioral disturbance (HCC)  Resumed Seroquel 100 mg at 6 PM daily and ordered Seroquel 50 mg nightly as needed  Resumed home lorazepam 0.5 mg p.o. twice  daily as needed for anxiety  Placement pending for SNF/memory care  Acquired hypothyroidism  Versus sub-clinical  TSH on admission was 5.287 Free T4 was within normal limits  Per admitting physician's discussion with spouse over the phone, she did not know that her husband was prescribed thyroid medication and she has not given it to him in over a month, may explains why he  has been more weak than baseline with poor p.o. intake in over a month, may explain constipation  resumed his home hypothyroid medication however his TSH and free T4 is appropriate for his age  Follow TSH outpatient  Weakness  Multifactorial  PT/OT recommends SNF.  TOC working on placement.  Hypokalemia  Repleted.  Refused labs today.  Continue to monitor  Dehydration Secondary to poor PO intake  Encourage po  Giving some IV fluid  Sundowning  Resumed home Seroquel 100 mg daily at 6 PM  Placement/memory care pending.  Safety sitter in place  Constipation Resolved - large BM 08/10  Asymptomatic on evaluation and physical exam  Abdominal x-ray concerning for ileus 08/10 --> adjusted bowel regimen to scheduled  Synthroid restarted, constipation may also be due to subclinical hypothyroid        Subjective: Patient was seen and examined today.  Stating that I am fine.  No complaints.  Physical Exam: Vitals:   12/10/21 1720 12/10/21 2045 12/11/21 0552 12/11/21 1456  BP: (!) 123/96 129/70 (!) 134/92 127/71  Pulse: 68 69 70 83  Resp: '18 18 18 14  '$ Temp: 97.9 F (36.6 C) 97.7 F (36.5 C) (!) 97.2 F (36.2 C) 97.9 F (36.6 C)  TempSrc: Oral Oral Oral   SpO2: 99% 97% 97% 98%  Weight:      Height:       General.  Frail elderly man, in no acute distress. Pulmonary.  Lungs clear bilaterally, normal respiratory effort. CV.  Regular rate and rhythm, no JVD, rub or murmur. Abdomen.  Soft, nontender, nondistended, BS positive. CNS.  Alert and oriented to self.  No focal neurologic deficit. Extremities.  No edema, no cyanosis, pulses intact and symmetrical. Psychiatry.  Judgment and insight appears impaired  Data Reviewed: Prior data reviewed.  Family Communication:   Disposition: Status is: Inpatient Remains inpatient appropriate because: Unsafe  discharge as wife cannot take care of him, need placement, no bed offer yet.   Planned Discharge Destination:  Skilled nursing facility  Time spent: 38 minutes  This record has been created using Systems analyst. Errors have been sought and corrected,but may not always be located. Such creation errors do not reflect on the standard of care.  Author: Lorella Nimrod, MD 12/11/2021 2:58 PM  For on call review [email or web address].

## 2021-12-11 NOTE — Assessment & Plan Note (Signed)
   Repleted.  Refused labs today.  Continue to monitor

## 2021-12-11 NOTE — TOC Progression Note (Addendum)
Transition of Care [facility]) - Progression Note    Patient Details  Name: Corey Todd MRN: [id number] Date of Birth: 1927/05/27  Transition of Care [facility]) CM/SW Contact  Beverly Sessions, RN Phone Number: 12/11/2021, 3:49 PM  Clinical Narrative:     Hospice Screening Checklist filled out and packet completed.  MD to sign tomorrow    Notified By Marolyn Hammock at [facility] that since the patient has a 30 [employer] contract there is a different list of faclities that need to be called for 30 day rehab - [facility] - per [employer] coordinator she will have to check to see if they are in network with the optium contract. Referral faxed to [phone].  - [facility] - Vm left for admissions - [facility]-  Per Daleen Snook at admission they do not accept [place] - [phone]  - Per Olivia Mackie at admissions they do not accept VA contract.   Expected Discharge Plan and Services                                                 Social Determinants of Health (SDOH) Interventions    Readmission Risk Interventions     No data to display

## 2021-12-11 NOTE — Assessment & Plan Note (Signed)
Resolved

## 2021-12-11 NOTE — TOC Progression Note (Addendum)
Transition of Care [facility]) - Progression Note    Patient Details  Name: HENDERSON FRAMPTON MRN: [id number] Date of Birth: April 21, 1927  Transition of Care [facility]) CM/SW Contact  Beverly Sessions, RN Phone Number: 12/11/2021, 1:08 PM  Clinical Narrative:    Patient wife agreeable to look at hospice home Per MD.  Confirmed with wife  Referral was made to [employer].  Per Santiago Glad with Manufacturing engineer patient does not meet criteria at this time  Attempted to call wife back x3 to update.  Unable to reach or leave a message   MD to trail patient off IV fluid and Monitor PO intake   Spoke with Marchia Meiers at [facility].  They currently have a wait list .  Faxed referral for review to [phone]  Message left for Roma Schanz [phone] ext [phone] VA social worker to update and determine if they have any recommendations for hospice services          Expected Discharge Plan and Services                                                 Social Determinants of Health (SDOH) Interventions    Readmission Risk Interventions     No data to display

## 2021-12-12 DIAGNOSIS — E870 Hyperosmolality and hypernatremia: Secondary | ICD-10-CM | POA: Diagnosis not present

## 2021-12-12 DIAGNOSIS — Z515 Encounter for palliative care: Secondary | ICD-10-CM | POA: Diagnosis not present

## 2021-12-12 LAB — BASIC METABOLIC PANEL
Anion gap: 7 (ref 5–15)
BUN: 24 mg/dL — ABNORMAL HIGH (ref 8–23)
CO2: 21 mmol/L — ABNORMAL LOW (ref 22–32)
Calcium: 7.9 mg/dL — ABNORMAL LOW (ref 8.9–10.3)
Chloride: 112 mmol/L — ABNORMAL HIGH (ref 98–111)
Creatinine, Ser: 1.14 mg/dL (ref 0.61–1.24)
GFR, Estimated: >60 mL/min (ref >60)
Glucose, Bld: 88 mg/dL (ref 70–99)
Potassium: 2.4 mmol/L — CL (ref 3.5–5.1)
Sodium: 140 mmol/L (ref 135–145)

## 2021-12-12 MED ORDER — POTASSIUM CHLORIDE 10 MEQ/100ML IV SOLN
10.0000 meq | INTRAVENOUS | Status: DC
  Filled 2021-12-12 (×2): qty 100

## 2021-12-12 MED ORDER — POTASSIUM CHLORIDE 10 MEQ/100ML IV SOLN
10.0000 meq | INTRAVENOUS | Status: AC
  Administered 2021-12-12 – 2021-12-13 (×6): 10 meq via INTRAVENOUS
  Filled 2021-12-12 (×4): qty 100

## 2021-12-12 MED ORDER — POTASSIUM CHLORIDE CRYS ER 20 MEQ PO TBCR: 40.0000 meq | EXTENDED_RELEASE_TABLET | Freq: Once | ORAL | Status: DC

## 2021-12-12 NOTE — TOC Progression Note (Signed)
Transition of Care [facility]) - Progression Note    Patient Details  Name: Corey Todd MRN: [id number] Date of Birth: [birth year]  Transition of Care [facility]) CM/SW Contact  Beverly Sessions, RN Phone Number: 12/12/2021, 4:38 PM  Clinical Narrative:          Received call from [facility] at [facility].  She states they can accept the patient with the 30 day VA optum contract and work with the [place] to transition to [place] or private pay  Wife and son Elta Guadeloupe updated.  Wife declines placement at [facility].  She states she is also not interested in [place] or any of the other facilities.  Wife states that if patient is not approved for the hospice home of [place] tomorrow she will take him home with hospice services through [employer] is in agreement.  They are aware they will have to provide transportation at discharge.   Lorayne Bender with Manufacturing engineer and MD updated   Expected Discharge Plan and Services                                                 Social Determinants of Health (SDOH) Interventions    Readmission Risk Interventions     No data to display

## 2021-12-12 NOTE — TOC Progression Note (Signed)
Transition of Care [facility]) - Progression Note    Patient Details  Name: Corey Todd MRN: [id number] Date of Birth: 08-13-1927  Transition of Care [facility]) CM/SW Contact  Beverly Sessions, RN Phone Number: 12/12/2021, 1:34 PM  Clinical Narrative:      Received call from [place] at [facility].  She is the NP and is reviewing the referral       Expected Discharge Plan and Services                                                 Social Determinants of Health (SDOH) Interventions    Readmission Risk Interventions     No data to display

## 2021-12-12 NOTE — Progress Notes (Signed)
Patient is confused and only alert to self. Pleasant but will place hands on staff and push them away when he does not want to take meds. Patient refused night time vitamin. He agreed to take the synthroid this morning but refused the lovenox after initially agreeing to take it. He also refused lab work this morning.

## 2021-12-12 NOTE — Progress Notes (Addendum)
[facility]) hospital Liaison note:  Chart notes reviewed with hospice physician Dr. Lyman Speller after MD requested to reassess on 8.29 as patient did not meet criteria for Hospice Home on 8.28.   At this time patient does not appear Hospice Home appropriate. MD has requested to reassess eligibility in 24 hours. IDT aware  Thank you   Daphene Calamity, [place] [phone]

## 2021-12-12 NOTE — Progress Notes (Signed)
Nutrition Follow Up Note   DOCUMENTATION CODES:   Non-severe (moderate) malnutrition in context of chronic illness  INTERVENTION:   Ensure Enlive po TID, each supplement provides 350 kcal and 20 grams of protein.  Magic cup TID with meals, each supplement provides 290 kcal and 9 grams of protein  MVI po daily   Vitamin C $RemoveB'500mg'lIFlZhxv$  po BID  Pt at high refeed risk  NUTRITION DIAGNOSIS:   Moderate Malnutrition related to chronic illness (dementia) as evidenced by moderate fat depletion, severe fat depletion, moderate muscle depletion, severe muscle depletion.  GOAL:   Patient will meet greater than or equal to 90% of their needs -not met   MONITOR:   PO intake, Supplement acceptance, Labs, Weight trends, Skin, I & O's  ASSESSMENT:   88 y/o male with h/o dementia, anxiety, hypothyroidism, BPH, HOH, chronic constipation, GERD, HTN, IDA, DVT and hernia repair who is admitted with FTT.  Pt continues to have poor appetite and oral intake in hospital; pt eating sips/bites of meals when he does eat. Pt refuses most meals, supplements, medications and labs. Pt remains at high refeed risk. Pt has not been weighed since 8/16. Family is requesting hospice at this time and is awaiting placement vs home with hospice.   Medications reviewed and include: vitamin C, lovenox, synthroid, MVI  Labs reviewed: K 3.3(L)- 8/27  Diet Order:   Diet Order             Diet regular Room service appropriate? Yes; Fluid consistency: Thin  Diet effective now                  EDUCATION NEEDS:   Not appropriate for education at this time  Skin:  Skin Assessment: Reviewed RN Assessment (Stage I left and right buttocks, skin tear arm)  Last BM:  8/28- TYPE 7  Height:   Ht Readings from Last 1 Encounters:  11/22/21 $RemoveB'5\' 8"'TrcSrlHj$  (1.727 m)    Weight:   Wt Readings from Last 1 Encounters:  11/29/21 74.2 kg    Ideal Body Weight:  70 kg  BMI:  Body mass index is 24.87 kg/m.  Estimated  Nutritional Needs:   Kcal:  1700-1900kcal/day  Protein:  85-95g/day  Fluid:  1.8-2.1L/day  Koleen Distance MS, RD, LDN Please refer to [facility] for RD and/or RD on-call/weekend/after hours pager

## 2021-12-12 NOTE — Progress Notes (Addendum)
       CROSS COVER NOTE  NAME: Corey Todd MRN: [id number] DOB : 05/22/1927    Date of Service   12/12/2021   HPI/Events of Note   Notified of critical K --> 2.4  Interventions   Plan: 40 mEQ PO K, RN to notify provider if patient refuses PO K. 40 mEQ IV K Recheck K at 0100A      This document was prepared using Dragon voice recognition software and may include unintentional dictation errors.  Neomia Glass DNP, MHA, FNP-BC Nurse Practitioner [facility] Pager [phone]

## 2021-12-12 NOTE — Progress Notes (Addendum)
Daily Progress Note   Patient Name: Corey Todd       Date: 12/12/2021 DOB: February 02, 1928  Age: 88 y.o. MRN#: [id number] Attending Physician: Lorella Nimrod, MD Primary Care Physician: Crecencio Mc, MD Admit Date: 11/22/2021  Reason for Consultation/Follow-up: Establishing goals of care  Subjective: Notes and labs reviewed. In to see patient; no family at bedside. He is resting with eyes closed, but opens them upon speaking to him. He denies complaint. He shakes my hand and is pleasantly confused. His breakfast is at bedside, untouched. He states he is hungry; staff in to bedside after my visit and patient refused food or medications. Per notes oral intake has been insufficient since the 24th, and electrolytes have been abnormal.   Given patient's minimal intake since 8/24, and refusal of medications including those for electrolyte replacement, he appears to have a prognosis of < 2 weeks. I would recommend hospice facility placement if this is what the family wants. If the local hospice is not an option, would recommend looking at other options.    Length of Stay: 19  Current Medications: Scheduled Meds:   vitamin C  500 mg Oral BID   enoxaparin (LOVENOX) injection  40 mg Subcutaneous QHS   feeding supplement  237 mL Oral TID BM   levothyroxine  50 mcg Oral Q0600   lidocaine  1 Application Urethral Once   multivitamin with minerals  1 tablet Oral Daily   QUEtiapine  100 mg Oral q1800    Continuous Infusions:   PRN Meds: LORazepam, mouth rinse, polyethylene glycol, QUEtiapine, senna-docusate  Physical Exam Pulmonary:     Effort: Pulmonary effort is normal.  Skin:    General: Skin is warm and dry.  Neurological:     Mental Status: He is alert.             Vital Signs: BP  120/60 (BP Location: Right Arm)   Pulse 70   Temp 98 F (36.7 C) (Oral)   Resp 18   Ht '5\' 8"'$  (1.727 m)   Wt 74.2 kg   SpO2 98%   BMI 24.87 kg/m  SpO2: SpO2: 98 % O2 Device: O2 Device: Room Air O2 Flow Rate:    Intake/output summary: No intake or output data in the 24 hours ending 12/12/21 1159 LBM: Last BM Date :  12/11/21 Baseline Weight: Weight: 75.3 kg Most recent weight: Weight: 74.2 kg         Patient Active Problem List   Diagnosis Date Noted   Dehydration 11/22/2021   Hypokalemia 11/22/2021   AKI (acute kidney injury) ([place]) 11/22/2021   Hypernatremia 11/22/2021   Protein-calorie malnutrition, moderate ([place]) 11/22/2021   Weakness 11/22/2021   Pressure injury of skin 11/22/2021   Incontinence of feces 04/20/2021   Weight gain 04/20/2021   Sundowning 05/02/2020   Cerumen impaction 12/23/2019   Acquired hypothyroidism 12/23/2019   Constipation 09/29/2018   Vascular dementia with behavioral disturbance ([place]) 04/20/2018   Incontinence of urine 04/20/2018   Iron deficiency anemia 12/15/2017   Hospital discharge follow-up 12/15/2017   Bilateral lower extremity edema 11/25/2017   Closed right hip fracture, sequela 11/14/2017   Right leg swelling 10/17/2017   Proximal leg weakness 10/17/2017   Herpes zoster without complication 09/98/3382   Encounter for preventive health examination 02/14/2017   Skin lesion of face 09/09/2015   Counseling regarding advanced directives and goals of care 12/26/2014   Do not resuscitate discussion 12/26/2014   Hypertension 08/07/2014   Encounter for Medicare annual wellness exam 11/18/2012   GERD (gastroesophageal reflux disease) 05/09/2012   Presbyacusis     Palliative Care Assessment & Plan    Recommendations/Plan:  Recommend hospice facility placement if this is what family desires. Given poor PO intake and refusal of medications including those for electrolyte replacement for a sustained period, prognosis would be < 2 weeks.    If local hospice is not an option, would recommend looking at other options.     Code Status:    Code Status Orders  (From admission, onward)           Start     Ordered   11/22/21 1406  Do not attempt resuscitation (DNR)  Continuous       Question Answer Comment  In the event of cardiac or respiratory ARREST Do not call a "code blue"   In the event of cardiac or respiratory ARREST Do not perform Intubation, CPR, defibrillation or ACLS   In the event of cardiac or respiratory ARREST Use medication by any route, position, wound care, and other measures to relive pain and suffering. May use oxygen, suction and manual treatment of airway obstruction as needed for comfort.      11/22/21 1405           Code Status History     Date Active Date Inactive Code Status Order ID Comments User Context   11/22/2021 1404 11/22/2021 1405 Full Code [id number]  Criss Alvine, DO ED   02/04/2021 1713 11/22/2021 1215 DNR [id number]  Crecencio Mc, MD Outpatient   11/14/2017 1421 11/16/2017 1403 Full Code [id number]  Salary, Avel Peace, MD Inpatient     Care plan was discussed with RN  Thank you for allowing the Palliative Medicine Team to assist in the care of this patient.   Asencion Gowda, NP  Please contact Palliative Medicine Team phone at [phone] for questions and concerns.

## 2021-12-12 NOTE — Progress Notes (Signed)
Pt refused labs. Dr. Reesa Chew notified. No new orders at this time.

## 2021-12-12 NOTE — Progress Notes (Signed)
Progress Note   Patient: Corey Todd:[id number] DOB: 10/09/1927 DOA: 11/22/2021     19 DOS: the patient was seen and examined on 12/12/2021   Brief hospital course: [name] is a 88 year old male with vascular dementia with nighttime agitation, hypothyroid, constipation, anxiety, history of sundowning, who presents to the emergency department 11/22/2021 from home via EMS for chief concerns of poor p.o. intake for 1 month worse over past 2 days . 08/09: sodium 151, creatinine 1.61, GFR 40, Free T4 / TSH was appropriate levels despite not on home medications per wife. COVID by PCR was negative. ED treatment: LR 500 mL bolus.  Admitted to hospitalist service for hyponatremia secondary to dehydration/poor p.o. intake.  Wife is requesting SNF placement, TOC consulted.  Synthroid restarted.  Marland Kitchen 08/10: Sodium WNL at 141, potassium low at 3.0 and was repleted, creatinine improved to 1.31 with GFR 51.  Hgb dropped to 10.9 likely hemodilutional from IV fluids.  Placement pending, following potassium . 08/11-08/16: SNF placement pending.  . 8/17-8/19: Palliative care consult, pending SNF placement.  8/20: Patient remained stable.  Awaiting SNF placement.  8/21: Still no [facility] approved bed available.  8/22: Still no [facility] bed available.  8/24: Still no [facility] bed availability.  Repeat BMP today with hypokalemia, magnesium 2.3.  Potassium is being repleted. Patient refusing most of the food except ice cream. Told nursing staff to try Ensure or boost.  8/25: Patient refusing most of the p.o. intake.  Potassium remains low despite replacement.  Sodium started to trend up at 147 today. Likely secondary to poor p.o. intake. Another message sent to palliative care to revisit. We will try giving him some IV. If continue to refuse he will be appropriate for hospice.  8/26: Patient continued to refuse most of the p.o. intake.  Potassium remains low, he was refusing most of the replacement yesterday.   Ordered IV potassium. Had a long discussion with wife yesterday as he might need to go to hospice for failure to thrive, if he continues to refuse p.o. intake and most of the care.  She will discuss with her kids and let us know about their decision in 1 to 2 days.  8/27: Patient continued to refuse most of the p.o. intake, drinking couple of sips of boost or 1-2 bites of ice cream only. Discussed with son and family wants him to get evaluated for hospice home.  TOC to send the referral. Palliative care was consulted earlier and should be able to see him tomorrow. Potassium with some improvement but still low-ordered more replacement.  Continuing IV fluid at this time as family has not decided about full comfort only.  8/28: Patient remained the same.  Intermittently refusing p.o. intake and taking some time.  Discontinuing IV fluid. He does not qualify for hospice home.  Still no bed offer.  8/29: Patient continued to refuse care, most of the food and labs.  Hospice might reevaluate him if he continued to have this behavior as family would like him to go to hospice facility. Still no other bed offer.   Assessment and Plan: * Hypernatremia Improved with IV fluid. -Stop IV fluid and monitor   AKI (acute kidney injury) ([place]) Resolved   Protein-calorie malnutrition, moderate (H[facility]) Estimated body mass index is 24.87 kg/m as calculated from the following:   Height as of this encounter: '5\' 8"'$  (1.727 m).   Weight as of this encounter: 74.2 kg.    Complicates overall prognosis.  Dietitian consult  Patient refusing most of the p.o. intake  Vascular dementia with behavioral disturbance ([facility])  Resumed Seroquel 100 mg at 6 PM daily and ordered Seroquel 50 mg nightly as needed  Resumed home lorazepam 0.5 mg p.o. twice daily as needed for anxiety  Placement pending for SNF/memory care  Acquired hypothyroidism  Versus sub-clinical  TSH on admission was 5.287 Free T4 was within  normal limits  Per admitting physician's discussion with spouse over the phone, she did not know that her husband was prescribed thyroid medication and she has not given it to him in over a month, may explains why he has been more weak than baseline with poor p.o. intake in over a month, may explain constipation  resumed his home hypothyroid medication however his TSH and free T4 is appropriate for his age  Follow TSH outpatient  Weakness  Multifactorial  PT/OT recommends SNF.  TOC working on placement.  Hypokalemia  Repleted.  Refused labs today.  Continue to monitor  Dehydration Secondary to poor PO intake  Encourage po  Giving some IV fluid  Sundowning  Resumed home Seroquel 100 mg daily at 6 PM  Placement/memory care pending.  Safety sitter in place  Constipation Resolved - large BM 08/10  Asymptomatic on evaluation and physical exam  Abdominal x-ray concerning for ileus 08/10 --> adjusted bowel regimen to scheduled  Synthroid restarted, constipation may also be due to subclinical hypothyroid        Subjective: Patient was seen and examined today.  Denies any complaint but refusing most of the care.  Physical Exam: Vitals:   12/11/21 1456 12/11/21 2059 12/12/21 0342 12/12/21 0856  BP: 127/71 123/65 115/64 120/60  Pulse: 83 73 72 70  Resp: '14 16 16 18  '$ Temp: 97.9 F (36.6 C) 98.4 F (36.9 C) 98.4 F (36.9 C) 98 F (36.7 C)  TempSrc:   Oral Oral  SpO2: 98% 95% 96% 98%  Weight:      Height:       General.  Frail elderly man, in no acute distress. Pulmonary.  Lungs clear bilaterally, normal respiratory effort. CV.  Regular rate and rhythm, no JVD, rub or murmur. Abdomen.  Soft, nontender, nondistended, BS positive. CNS.  Alert and oriented to self only.  No focal neurologic deficit. Extremities.  No edema, no cyanosis, pulses intact and symmetrical. Psychiatry.  Judgment and insight appears impaired.  Data Reviewed: Prior data  reviewed  Family Communication: Discussed with wife on phone.  Disposition: Status is: Inpatient Remains inpatient appropriate because: Unsafe discharge.   Planned Discharge Destination: Hospice   Time spent: 43 minutes  Author: Lorella Nimrod, MD 12/12/2021 3:08 PM  For on call review [email or web address].

## 2021-12-12 NOTE — Progress Notes (Addendum)
Physical Therapy Treatment Patient Details Name: Corey Todd MRN: [id number] DOB: 02/14/1928 Today's Date: 12/12/2021   History of Present Illness [name] is a 88 year old male with vascular dementia with nighttime agitation, hypothyroid, constipation, anxiety, history of sundowning, who presents to the emergency department from home for chief concerns of poor p.o. intake for 2 days.    PT Comments    Pt is not making progress towards goals demonstrating decreased retention and carry over with tasks. Pt is more non verbal than prior session and communicates via gestures. Delayed response following commands. Pt appears to be at new baseline level with ability to ambulate in hallway with min assist and RW. Brief donned for all mobility. Encourage nursing staff and mobility specialists to continue further mobility, however will discontinue acute PT at this time.   Recommendations for follow up therapy are one component of a multi-disciplinary discharge planning process, led by the attending physician.  Recommendations may be updated based on patient status, additional functional criteria and insurance authorization.  Follow Up Recommendations  Long-term institutional care without follow-up therapy Can patient physically be transported by private vehicle: Yes   Assistance Recommended at Discharge Frequent or constant Supervision/Assistance  Patient can return home with the following A little help with walking and/or transfers;A lot of help with bathing/dressing/bathroom;Assistance with cooking/housework;Assistance with feeding;Direct supervision/assist for medications management;Direct supervision/assist for financial management;Assist for transportation;Help with stairs or ramp for entrance   Equipment Recommendations  None recommended by PT    Recommendations for Other Services       Precautions / Restrictions Precautions Precautions: Fall Restrictions Weight Bearing  Restrictions: No     Mobility  Bed Mobility Overal bed mobility: Needs Assistance Bed Mobility: Supine to Sit     Supine to sit: Min assist     General bed mobility comments: needs cues for initiation of movement. Also performed rolling with mod assist for donning brief. Once seated, able to to sit with supervision    Transfers Overall transfer level: Needs assistance Equipment used: Rolling walker (2 wheels) Transfers: Sit to/from Stand Sit to Stand: Min assist           General transfer comment: has trouble with initial standing, however improves with reps    Ambulation/Gait Ambulation/Gait assistance: Min assist Gait Distance (Feet): 100 Feet Assistive device: Rolling walker (2 wheels) Gait Pattern/deviations: Step-through pattern       General Gait Details: pt initially resistant to mobility, however was eventually agreeable and able to ambulate in hallway with contant cues for upright posture and RW distance   Stairs             Wheelchair Mobility    Modified Rankin (Stroke Patients Only)       Balance Overall balance assessment: Needs assistance Sitting-balance support: Feet supported Sitting balance-Leahy Scale: Good     Standing balance support: Bilateral upper extremity supported Standing balance-Leahy Scale: Fair                              Cognition Arousal/Alertness: Awake/alert Behavior During Therapy: Flat affect Overall Cognitive Status: History of cognitive impairments - at baseline                                 General Comments: pt mostly nonverbal today, pointing and giving gestures to scratch back and cup for drink.  Exercises Other Exercises Other Exercises: assisted with respositioning in chair to eat delayed breakfast and drink half of ensure.    General Comments        Pertinent Vitals/Pain Pain Assessment Pain Assessment: No/denies pain    Home Living                           Prior Function            PT Goals (current goals can now be found in the care plan section) Acute Rehab PT Goals Patient Stated Goal: unable to state PT Goal Formulation: With patient Time For Goal Achievement: 12/07/21 Potential to Achieve Goals: Good Progress towards PT goals: Progressing toward goals    Frequency    Min 2X/week      PT Plan Discharge plan needs to be updated    Co-evaluation              AM-PAC PT "6 Clicks" Mobility   Outcome Measure  Help needed turning from your back to your side while in a flat bed without using bedrails?: None Help needed moving from lying on your back to sitting on the side of a flat bed without using bedrails?: A Little Help needed moving to and from a bed to a chair (including a wheelchair)?: A Little Help needed standing up from a chair using your arms (e.g., wheelchair or bedside chair)?: A Little Help needed to walk in hospital room?: A Little Help needed climbing 3-5 steps with a railing? : A Lot 6 Click Score: 18    End of Session Equipment Utilized During Treatment: Gait belt Activity Tolerance: Patient tolerated treatment well Patient left: in chair;with chair alarm set Nurse Communication: Mobility status PT Visit Diagnosis: Unsteadiness on feet (R26.81);Muscle weakness (generalized) (M62.81);Difficulty in walking, not elsewhere classified (R26.2)     Time: 6295-2841 PT Time Calculation (min) (ACUTE ONLY): 24 min  Charges:  $Gait Training: 23-37 mins                     Corey Todd, PT, DPT, GCS [phone]    Corey Todd 12/12/2021, 11:48 AM

## 2021-12-13 ENCOUNTER — Encounter: Payer: Self-pay | Admitting: Osteopathic Medicine

## 2021-12-13 DIAGNOSIS — E870 Hyperosmolality and hypernatremia: Secondary | ICD-10-CM | POA: Diagnosis not present

## 2021-12-13 DIAGNOSIS — E440 Moderate protein-calorie malnutrition: Secondary | ICD-10-CM | POA: Diagnosis not present

## 2021-12-13 DIAGNOSIS — E876 Hypokalemia: Secondary | ICD-10-CM

## 2021-12-13 LAB — PHOSPHORUS: Phosphorus: 2.4 mg/dL — ABNORMAL LOW (ref 2.5–4.6)

## 2021-12-13 LAB — CREATININE, SERUM
Creatinine, Ser: 1.13 mg/dL (ref 0.61–1.24)
GFR, Estimated: >60 mL/min (ref >60)

## 2021-12-13 LAB — POTASSIUM
Potassium: 3 mmol/L — ABNORMAL LOW (ref 3.5–5.1)
Potassium: 2.8 mmol/L — ABNORMAL LOW (ref 3.5–5.1)

## 2021-12-13 MED ORDER — POTASSIUM CHLORIDE 10 MEQ/100ML IV SOLN
10.0000 meq | INTRAVENOUS | Status: AC
  Administered 2021-12-13 (×6): 10 meq via INTRAVENOUS
  Filled 2021-12-13 (×4): qty 100

## 2021-12-13 MED ORDER — POTASSIUM CHLORIDE 10 MEQ/100ML IV SOLN: 10.0000 meq | INTRAVENOUS | Status: DC

## 2021-12-13 NOTE — TOC Progression Note (Addendum)
Transition of Care [facility]) - Progression Note    Patient Details  Name: Corey Todd MRN: [id number] Date of Birth: 01/30/1928  [facility] CM/SW Contact  Beverly Sessions, RN Phone Number: 12/13/2021, 1:44 PM  Clinical Narrative:      Patient was not approved for hospice home of [place] Will proceed with home discharge with hospice services through [facility].  Lorayne Bender with Manufacturing [employer] aware.  MD updated.  Wife aware    Son to transport at discharge VM left for his [place] to update     Expected Discharge Plan and Services                                                 Social Determinants of Health (SDOH) Interventions    Readmission Risk Interventions     No data to display

## 2021-12-13 NOTE — Progress Notes (Signed)
Manufacturing engineer [facility]) hospital Liaison note:   Visited patient and bedside on this date and patient pleasantly confused and greeted this Probation officer with a high five. Chart notes reviewed with hospice physician Dr. Lyman Speller after MD requested to reassess.  At this time patient is not appropriate for the Hospice Home. Wife/Louise notified and has requested for patient to return home with hospice services. Louise declined any DME needs & requested DC tomorrow to prepare the home.  IDT aware of above updates.  Thank you   Daphene Calamity, [place] [phone]

## 2021-12-13 NOTE — Progress Notes (Addendum)
Progress Note    Corey Todd  YIF:[id number] DOB: 07/10/1927  DOA: 11/22/2021 PCP: Crecencio Mc, MD      Brief Narrative:    Medical records reviewed and are as summarized below:   [name] is a 88 year old male with vascular dementia with nighttime agitation, hypothyroid, constipation, anxiety, history of sundowning, who presents to the emergency department 11/22/2021 from home via EMS for chief concerns of poor p.o. intake for 1 month worse over past 2 days 08/09: sodium 151, creatinine 1.61, GFR 40, Free T4 / TSH was appropriate levels despite not on home medications per wife. COVID by PCR was negative. ED treatment: LR 500 mL bolus.  Admitted to hospitalist service for hyponatremia secondary to dehydration/poor p.o. intake.  Wife is requesting SNF placement, TOC consulted.  Synthroid restarted.  08/10: Sodium WNL at 141, potassium low at 3.0 and was repleted, creatinine improved to 1.31 with GFR 51.  Hgb dropped to 10.9 likely hemodilutional from IV fluids.  Placement pending, following potassium 08/11-08/16: SNF placement pending.  8/17-8/19: Palliative care consult, pending SNF placement.  8/20: Patient remained stable.  Awaiting SNF placement.  8/21: Still no [facility] approved bed available.  8/22: Still no VA bed available.  8/24: Still no VA bed availability.  Repeat BMP today with hypokalemia, magnesium 2.3.  Potassium is being repleted. Patient refusing most of the food except ice cream. Told nursing staff to try Ensure or boost.  8/25: Patient refusing most of the p.o. intake.  Potassium remains low despite replacement.  Sodium started to trend up at 147 today. Likely secondary to poor p.o. intake. Another message sent to palliative care to revisit. We will try giving him some IV. If continue to refuse he will be appropriate for hospice.  8/26: Patient continued to refuse most of the p.o. intake.  Potassium remains low, he was refusing most of the  replacement yesterday.  Ordered IV potassium. Had a long discussion with wife yesterday as he might need to go to hospice for failure to thrive, if he continues to refuse p.o. intake and most of the care.  She will discuss with her kids and let us know about their decision in 1 to 2 days.  8/27: Patient continued to refuse most of the p.o. intake, drinking couple of sips of boost or 1-2 bites of ice cream only. Discussed with son and family wants him to get evaluated for hospice home.  TOC to send the referral. Palliative care was consulted earlier and should be able to see him tomorrow. Potassium with some improvement but still low-ordered more replacement.  Continuing IV fluid at this time as family has not decided about full comfort only.  8/28: Patient remained the same.  Intermittently refusing p.o. intake and taking some time.  Discontinuing IV fluid. He does not qualify for hospice home.  Still no bed offer.  8/29: Patient continued to refuse care, most of the food and labs.  Hospice might reevaluate him if he continued to have this behavior as family would like him to go to hospice facility. Still no other bed offer.      Assessment/Plan:   Principal Problem:   Hypernatremia Active Problems:   AKI (acute kidney injury) ([place])   Protein-calorie malnutrition, moderate (HCC)   Vascular dementia with behavioral disturbance (HCC)   Acquired hypothyroidism   Weakness   Constipation   Sundowning   Dehydration   Hypokalemia   Pressure injury of skin   Nutrition  Problem: Moderate Malnutrition Etiology: chronic illness (dementia) Patient has been refusing meals and medications   Signs/Symptoms: moderate fat depletion, severe fat depletion, moderate muscle depletion, severe muscle depletion   Body mass index is 24.87 kg/m.   Persistent hypokalemia: Replete potassium intravenously since patient is refusing to take oral potassium.  Repeat potassium level tomorrow  Vascular  dementia with behavioral disturbance, son dominant: Continue Seroquel.  Use lorazepam as needed for anxiety.  Hypothyroidism: Continue Synthroid.  Follow-up thyroid function test as an outpatient if desired  Generalized weakness: PT recommends discharge to SNF.  Constipation: Continue laxatives as needed.  Stage I decubitus ulcers on bilateral buttocks: Continue local wound care  Hyponatremia and AKI: Resolved  Patient does not meet criteria for discharge to hospice house.  His wife has decided to take him home with hospice tomorrow.   Diet Order             Diet regular Room service appropriate? Yes; Fluid consistency: Thin  Diet effective now                            Consultants: Palliative care  Procedures: None    Medications:    vitamin C  500 mg Oral BID   enoxaparin (LOVENOX) injection  40 mg Subcutaneous QHS   feeding supplement  237 mL Oral TID BM   levothyroxine  50 mcg Oral Q0600   lidocaine  1 Application Urethral Once   multivitamin with minerals  1 tablet Oral Daily   potassium chloride  40 mEq Oral Once   QUEtiapine  100 mg Oral q1800   Continuous Infusions:  potassium chloride       Anti-infectives (From admission, onward)    None              Family Communication/Anticipated D/C date and plan/Code Status   DVT prophylaxis: enoxaparin (LOVENOX) injection 40 mg Start: 11/22/21 2200 Place TED hose Start: 11/22/21 1400     Code Status: DNR  Family Communication: None  Disposition Plan: Plan discharge home tomorrow with hospice   Status is: Inpatient Remains inpatient appropriate because: Hypokalemia       Subjective:   Interval events noted.  He is confused and unable to provide any history.  Objective:    Vitals:   12/12/21 1956 12/12/21 2020 12/13/21 0222 12/13/21 0807  BP: 126/64 113/61 111/70 123/68  Pulse: 75 73 88 72  Resp: '18 15 16 18  '$ Temp:  98.1 F (36.7 C)  98.4 F (36.9 C)  TempSrc:   Oral  Oral  SpO2:  96% 91% 100%  Weight:      Height:       No data found.   Intake/Output Summary (Last 24 hours) at 12/13/2021 1442 Last data filed at 12/13/2021 1130 Gross per 24 hour  Intake 337 ml  Output 550 ml  Net -213 ml   Filed Weights   11/22/21 1609 11/29/21 1801  Weight: 75.3 kg 74.2 kg    Exam:  GEN: NAD SKIN: Bruises on bilateral upper extremities EYES: EOMI ENT: MMM CV: RRR PULM: CTA B ABD: soft, ND, NT, +BS CNS: AAO x 1 (person), non focal EXT: No edema or tenderness    Pressure Injury 11/22/21 Buttocks Left Stage 1 -  Intact skin with non-blanchable redness of a localized area usually over a bony prominence. white/pink discoloration of skin (Active)  11/22/21 1530  Location: Buttocks  Location Orientation: Left  Staging: Stage  1 -  Intact skin with non-blanchable redness of a localized area usually over a bony prominence.  Wound Description (Comments): white/pink discoloration of skin  Present on Admission: Yes  Dressing Type Foam - Lift dressing to assess site every shift 12/10/21 2215     Pressure Injury 11/22/21 Buttocks Right Stage 1 -  Intact skin with non-blanchable redness of a localized area usually over a bony prominence. pink/white color skin (Active)  11/22/21 1530  Location: Buttocks  Location Orientation: Right  Staging: Stage 1 -  Intact skin with non-blanchable redness of a localized area usually over a bony prominence.  Wound Description (Comments): pink/white color skin  Present on Admission: Yes  Dressing Type Foam - Lift dressing to assess site every shift 12/10/21 2215     Data Reviewed:   I have personally reviewed following labs and imaging studies:  Labs: Labs show the following:   Basic Metabolic Panel: Recent Labs  Lab 12/07/21 1006 12/07/21 1049 12/08/21 0914 12/09/21 1021 12/10/21 0536 12/12/21 1817 12/13/21 0135 12/13/21 0912  NA 145  --  147* 140 142 140  --   --   K 2.5*  --  2.5* 2.6* 3.3* 2.4*  3.0*  --   CL 117*  --  120* 115* 117* 112*  --   --   CO2 21*  --  22 20* 21* 21*  --   --   GLUCOSE 99  --  93 105* 95 88  --   --   BUN 34*  --  35* 29* 22 24*  --   --   CREATININE 1.12  --  1.10 1.21 1.15 1.14  --  1.13  CALCIUM 8.5*  --  8.3* 7.9* 8.2* 7.9*  --   --   MG  --  2.3  --   --   --   --   --   --    GFR Estimated Creatinine Clearance: 40.4 mL/min (by C-G formula based on SCr of 1.13 mg/dL). Liver Function Tests: No results for input(s): "AST", "ALT", "ALKPHOS", "BILITOT", "PROT", "ALBUMIN" in the last 168 hours. No results for input(s): "LIPASE", "AMYLASE" in the last 168 hours. No results for input(s): "AMMONIA" in the last 168 hours. Coagulation profile No results for input(s): "INR", "PROTIME" in the last 168 hours.  CBC: No results for input(s): "WBC", "NEUTROABS", "HGB", "HCT", "MCV", "PLT" in the last 168 hours. Cardiac Enzymes: No results for input(s): "CKTOTAL", "CKMB", "CKMBINDEX", "TROPONINI" in the last 168 hours. BNP (last 3 results) No results for input(s): "PROBNP" in the last 8760 hours. CBG: No results for input(s): "GLUCAP" in the last 168 hours. D-Dimer: No results for input(s): "DDIMER" in the last 72 hours. Hgb A1c: No results for input(s): "HGBA1C" in the last 72 hours. Lipid Profile: No results for input(s): "CHOL", "HDL", "LDLCALC", "TRIG", "CHOLHDL", "LDLDIRECT" in the last 72 hours. Thyroid function studies: No results for input(s): "TSH", "T4TOTAL", "T3FREE", "THYROIDAB" in the last 72 hours.  Invalid input(s): "FREET3" Anemia work up: No results for input(s): "VITAMINB12", "FOLATE", "FERRITIN", "TIBC", "IRON", "RETICCTPCT" in the last 72 hours. Sepsis Labs: No results for input(s): "PROCALCITON", "WBC", "LATICACIDVEN" in the last 168 hours.  Microbiology No results found for this or any previous visit (from the past 240 hour(s)).  Procedures and diagnostic studies:  No results found.             LOS: 20 days    [facility]   Pager on [email or web address]. If  7PM-7AM, please contact night-coverage at [email or web address]     12/13/2021, 2:42 PM

## 2021-12-14 ENCOUNTER — Ambulatory Visit: Payer: Self-pay | Admitting: *Deleted

## 2021-12-14 ENCOUNTER — Telehealth: Payer: Self-pay

## 2021-12-14 LAB — BASIC METABOLIC PANEL
Anion gap: 5 (ref 5–15)
BUN: 23 mg/dL (ref 8–23)
CO2: 23 mmol/L (ref 22–32)
Calcium: 8 mg/dL — ABNORMAL LOW (ref 8.9–10.3)
Chloride: 114 mmol/L — ABNORMAL HIGH (ref 98–111)
Creatinine, Ser: 1.13 mg/dL (ref 0.61–1.24)
GFR, Estimated: >60 mL/min (ref >60)
Glucose, Bld: 86 mg/dL (ref 70–99)
Potassium: 3.3 mmol/L — ABNORMAL LOW (ref 3.5–5.1)
Sodium: 142 mmol/L (ref 135–145)

## 2021-12-14 LAB — MAGNESIUM: Magnesium: 2.1 mg/dL (ref 1.7–2.4)

## 2021-12-14 LAB — PHOSPHORUS: Phosphorus: 2 mg/dL — ABNORMAL LOW (ref 2.5–4.6)

## 2021-12-14 NOTE — Discharge Summary (Signed)
Physician Discharge Summary   Patient: Corey Todd MRN: [id number] DOB: 29-Jan-1928  Admit date:     11/22/2021  Discharge date: 12/15/21  Discharge Physician: Jennye Boroughs   PCP: Crecencio Mc, MD   Recommendations at discharge:   Follow-up with hospice team within 24 hours of discharge  Discharge Diagnoses: Principal Problem:   Hypernatremia Active Problems:   AKI (acute kidney injury) ([place])   Protein-calorie malnutrition, moderate ([place])   Vascular dementia with behavioral disturbance ([place])   Acquired hypothyroidism   Weakness   Constipation   Sundowning   Dehydration   Hypokalemia   Pressure injury of skin  Resolved Problems:   BPH (benign prostatic hyperplasia)   Malnutrition of moderate degree  Hospital Course: Corey Todd is a 88 year old male with vascular dementia with nighttime agitation, hypothyroid, constipation, anxiety, history of sundowning, who presented to the emergency department on 11/22/2021 because of poor oral intake.  He was found to have hypernatremia, AKI, dehydration, failure to thrive and hypokalemia.  He was treated with IV fluids and oral and IV potassium.  He also had constipation that was treated with laxatives.  Unfortunately, patient was refusing to eat and take his medicines.  His wife and son decided to pursue comfort measures at hospice house.  He was evaluated by the hospice team but he was deemed ineligible for discharge to hospice house.  Ultimately, his family decided to take him home with hospice.  Discharge plan was discussed with the patient's wife and son Ronalee Belts) over the phone.  They have opted for discharge to home with hospice regardless of abnormal labs.  They prefer that his home medications be continued at this time.        Consultants: Palliative care, hospice team Procedures performed: None Disposition:  Home with hospice Diet recommendation:  Discharge Diet Orders (From admission, onward)     Start      Ordered   12/14/21 0000  Diet - low sodium heart healthy        12/14/21 1201           Cardiac diet DISCHARGE MEDICATION: Allergies as of 12/14/2021   No Known Allergies      Medication List     TAKE these medications    furosemide 20 MG tablet Commonly known as: LASIX TAKE ONE  TABLET EVERY DAY AS NEEDED FOR FLUID OR EDEMA   levothyroxine 50 MCG tablet Commonly known as: SYNTHROID Take 1 tablet (50 mcg total) by mouth daily.   LORazepam 0.5 MG tablet Commonly known as: ATIVAN Take 1 tablet (0.5 mg total) by mouth 2 (two) times daily.   QUEtiapine 50 MG tablet Commonly known as: SEROQUEL Take 1 tablet (50 mg total) by mouth 3 (three) times daily.               Discharge Care Instructions  (From admission, onward)           Start     Ordered   12/14/21 0000  Discharge wound care:       Comments: Apply Mepilex border to bilateral buttock wounds and avoid laying or sitting on your buttocks for too long   12/14/21 1201            Discharge Exam: Filed Weights   11/22/21 1609 11/29/21 1801  Weight: 75.3 kg 74.2 kg   GEN: NAD SKIN: Warm and dry EYES: No pallor or icterus ENT: MMM, hard of hearing CV: RRR PULM: CTA B ABD: soft, ND, NT, +  BS CNS: AAO x 1 (person), non focal EXT: No edema or tenderness   Condition at discharge: stable  The results of significant diagnostics from this hospitalization (including imaging, microbiology, ancillary and laboratory) are listed below for reference.   Imaging Studies: DG Abd 1 View  Result Date: 11/22/2021 CLINICAL DATA:  Constipation.  Poor p.o. intake EXAM: ABDOMEN - 1 VIEW COMPARISON:  10/08/2018 FINDINGS: Gaseous distension of large and small bowel within the abdomen including a prominent dilated loop of bowel in the right abdomen measuring 9 cm in diameter, likely representing the cecum. There is a moderate volume of stool throughout the colon. No gross free intraperitoneal air. IMPRESSION: 1.  Gaseous distension of large and small bowel with prominent dilated loop of bowel in the right abdomen, likely representing the cecum. Findings favor colonic ileus. 2. Moderate volume of stool within the colon. Electronically Signed   By: Davina Poke D.O.   On: 11/22/2021 16:51   DG Chest Port 1 View  Result Date: 11/22/2021 CLINICAL DATA:  Decreased p.o. intake EXAM: PORTABLE CHEST 1 VIEW COMPARISON:  Chest x-ray dated November 14, 2017 FINDINGS: Cardiac and mediastinal contours are within normal limits. Elevation of the right hemidiaphragm. Calcified granulomas of the right lung, unchanged when compared to prior exam. Mild bibasilar opacities, likely due to atelectasis. No large pleural effusion or pneumothorax. Prominent gas-filled loops of bowel are seen under the bilateral hemidiaphragms. IMPRESSION: 1. Mild bibasilar opacities, likely due to atelectasis. 2. Prominent gas-filled loops of bowel are seen under the bilateral hemidiaphragms. Consider dedicated abdominal radiograph for further evaluation. Electronically Signed   By: Yetta Glassman M.D.   On: 11/22/2021 15:05    Microbiology: Results for orders placed or performed during the hospital encounter of 11/22/21  SARS Coronavirus 2 by RT PCR (hospital order, performed in [facility] hospital lab) *cepheid single result test* Anterior Nasal Swab     Status: None   Collection Time: 11/22/21 12:25 PM   Specimen: Anterior Nasal Swab  Result Value Ref Range Status   SARS Coronavirus 2 by RT PCR NEGATIVE NEGATIVE Final    Comment: (NOTE) SARS-CoV-2 target nucleic acids are NOT DETECTED.  The SARS-CoV-2 RNA is generally detectable in upper and lower respiratory specimens during the acute phase of infection. The lowest concentration of SARS-CoV-2 viral copies this assay can detect is 250 copies / mL. A negative result does not preclude SARS-CoV-2 infection and should not be used as the sole basis for treatment or other patient management  decisions.  A negative result may occur with improper specimen collection / handling, submission of specimen other than nasopharyngeal swab, presence of viral mutation(s) within the areas targeted by this assay, and inadequate number of viral copies (<250 copies / mL). A negative result must be combined with clinical observations, patient history, and epidemiological information.  Fact Sheet for Patients:   https://www.patel.info/  Fact Sheet for Healthcare Providers: https://hall.com/  This test is not yet approved or  cleared by the [place] FDA and has been authorized for detection and/or diagnosis of SARS-CoV-2 by FDA under an Emergency Use Authorization (EUA).  This EUA will remain in effect (meaning this test can be used) for the duration of the COVID-19 declaration under Section 564(b)(1) of the Act, 21 U.S.C. section 360bbb-3(b)(1), unless the authorization is terminated or revoked sooner.  Performed at [facility], [address] [place], [name] [address]     Labs: CBC: No results for input(s): "WBC", "NEUTROABS", "HGB", "HCT", "MCV", "PLT"  in the last 168 hours. Basic Metabolic Panel: Recent Labs  Lab 12/09/21 1021 12/10/21 0536 12/12/21 1817 12/13/21 0135 12/13/21 0912 12/13/21 1456 12/14/21 1021  NA 140 142 140  --   --   --  142  K 2.6* 3.3* 2.4* 3.0*  --  2.8* 3.3*  CL 115* 117* 112*  --   --   --  114*  CO2 20* 21* 21*  --   --   --  23  GLUCOSE 105* 95 88  --   --   --  86  BUN 29* 22 24*  --   --   --  23  CREATININE 1.21 1.15 1.14  --  1.13  --  1.13  CALCIUM 7.9* 8.2* 7.9*  --   --   --  8.0*  MG  --   --   --   --   --   --  2.1  PHOS  --   --   --   --   --  2.4* 2.0*   Liver Function Tests: No results for input(s): "AST", "ALT", "ALKPHOS", "BILITOT", "PROT", "ALBUMIN" in the last 168 hours. CBG: No results for input(s): "GLUCAP" in the last 168 hours.  Discharge time spent: greater  than 30 minutes.  Signed: Jennye Boroughs, MD [facility] 12/15/2021

## 2021-12-14 NOTE — Patient Instructions (Signed)
Visit Information  Thank you for taking time to visit with me today. Please don't hesitate to contact me if I can be of assistance to you.   Following are the goals we discussed today:   Goals Addressed               This Visit's Progress     Assistance with placement (pt-stated)        Care Coordination Interventions: Follow up with patient's spouse regarding placement needs Patient's spouse states that patient will be discharging home today with in home hospice care-patient's spouse declined placement in [place] placement in [place] Patient's spouse confirms that the in home aids will resume, her son's will visit daily to help with patient's care needs, and her step-son will spend the weekends Patient's spouse states that she feels that supported and has sufficient help now to care for patient at home Active listening / Reflection utilized  Emotional support provided CSW encouraged patient's spouse to call this social worker if additional needs arise         Please call the care guide team at [phone] if you need to schedule an appointment with the care management team  If you are experiencing a Mental Health or [place] or need someone to talk to, please call 911   Patient verbalizes understanding of instructions and care plan provided today and agrees to view in [place]. Active MyChart status and patient understanding of how to access instructions and care plan via MyChart confirmed with patient.     No further follow up required: patient will start in home hospice care post discharge from the hospital. Patient will resume in home aid care, with additional support from family. Patient's spouse to call this Education officer, museum with any additional support needs  Elliot Gurney, [place] Worker  [facility] Care Management [phone]

## 2021-12-14 NOTE — Plan of Care (Signed)
  Problem: Clinical Measurements: Goal: Ability to maintain clinical measurements within normal limits will improve Outcome: Progressing   

## 2021-12-14 NOTE — Telephone Encounter (Signed)
Corey Todd called from [facility] to state they have received a Hospice referral for patient and would like to know if Dr. Deborra Medina will be attending of record.

## 2021-12-14 NOTE — Progress Notes (Signed)
Mobility Specialist - Progress Note   12/14/21 1300  Mobility  Activity Refused mobility     Pt declined mobility, no reason specified. Initially agreeable but then said no when attempting to get EOB. Son entered upon exit. Anticipating d/c.   Kathee Delton Mobility Specialist 12/14/21, 1:35 PM

## 2021-12-14 NOTE — Progress Notes (Signed)
[facility])    If applicable, please send signed and completed DNR with patient/family upon discharge. Please provide prescriptions at discharge as needed to ensure ongoing symptom management and a transport packet.   [facility] information and contact numbers given to family and above information shared with TOC.    Please call with any questions/concerns.    Thank you for the opportunity to participate in this patient's care   Daphene Calamity, MSW [facility] Liaison  [phone]

## 2021-12-14 NOTE — Plan of Care (Signed)
Patient confused, refused all meds this am, unable to assess mobility as patient refused for nurse to touch him and to get out of bed. Partial assessment documented due to patient refusal. Discharge orders are in. Son at bedside and will transport patient home. All belongings gathered. Problem: Education: Goal: Knowledge of General Education information will improve Description: Including pain rating scale, medication(s)/side effects and non-pharmacologic comfort measures Outcome: Adequate for Discharge   Problem: Health Behavior/Discharge Planning: Goal: Ability to manage health-related needs will improve Outcome: Adequate for Discharge   Problem: Clinical Measurements: Goal: Ability to maintain clinical measurements within normal limits will improve Outcome: Adequate for Discharge Goal: Will remain free from infection Outcome: Adequate for Discharge Goal: Diagnostic test results will improve Outcome: Adequate for Discharge Goal: Respiratory complications will improve Outcome: Adequate for Discharge Goal: Cardiovascular complication will be avoided Outcome: Adequate for Discharge   Problem: Activity: Goal: Risk for activity intolerance will decrease Outcome: Adequate for Discharge   Problem: Nutrition: Goal: Adequate nutrition will be maintained Outcome: Adequate for Discharge   Problem: Coping: Goal: Level of anxiety will decrease Outcome: Adequate for Discharge   Problem: Elimination: Goal: Will not experience complications related to bowel motility Outcome: Adequate for Discharge Goal: Will not experience complications related to urinary retention Outcome: Adequate for Discharge   Problem: Pain Managment: Goal: General experience of comfort will improve Outcome: Adequate for Discharge   Problem: Safety: Goal: Ability to remain free from injury will improve Outcome: Adequate for Discharge   Problem: Skin Integrity: Goal: Risk for impaired skin integrity will  decrease Outcome: Adequate for Discharge

## 2021-12-14 NOTE — Patient Outreach (Signed)
  Care Coordination   Follow Up Visit Note   12/14/2021 Name: Corey Todd MRN: [id number] DOB: 1928-03-13  Corey Todd is a 88 y.o. year old male who sees [name], [name], MD for primary care. I spoke with spouse of Corey Todd by phone today.  What matters to the patients health and wellness today? Family support, in home care    Goals Addressed               This Visit's Progress     Assistance with placement (pt-stated)        Care Coordination Interventions: Follow up with patient's spouse regarding placement needs Patient's spouse states that patient will be discharging home today with in home hospice care-patient's spouse declined placement in [place] placement in [place] Patient's spouse confirms that the in home aids will resume, her son's will visit daily to help with patient's care needs, and her step-son will spend the weekends Patient's spouse states that she feels that supported and has sufficient help now to care for patient at home Active listening / Reflection utilized  Emotional support provided CSW encouraged patient's spouse to call this social worker if additional needs arise        SDOH assessments and interventions completed:  Yes     Care Coordination Interventions Activated:  Yes  Care Coordination Interventions:  Yes, provided   Follow up plan: No further intervention required.   Encounter Outcome:  Pt. Visit Completed

## 2021-12-14 NOTE — TOC Transition Note (Signed)
Transition of Care [facility]) - CM/SW Discharge Note   Patient Details  Name: Corey Todd MRN: [id number] Date of Birth: [birth year]  Transition of Care [facility]) CM/SW Contact:  Beverly Sessions, RN Phone Number: 12/14/2021, 12:19 PM   Clinical Narrative:      Patient to discharge today Son at bedside for transport Lorayne Bender with [employer] notified  Final next level of care: Home w Hospice Care     Patient Goals and CMS Choice        Discharge Placement                       Discharge Plan and Services                                     Social Determinants of Health (SDOH) Interventions     Readmission Risk Interventions     No data to display

## 2021-12-15 ENCOUNTER — Telehealth: Payer: Self-pay

## 2021-12-15 NOTE — Telephone Encounter (Signed)
Transition Care Management Follow-up Telephone Call  No TCM completed. Referred to hospice in home.

## 2021-12-15 NOTE — Telephone Encounter (Signed)
Spoke with Sherrie to let her know that Dr. [name] will be the attending physician.

## 2021-12-21 ENCOUNTER — Inpatient Hospital Stay: Payer: No Typology Code available for payment source | Admitting: Internal Medicine

## 2022-01-01 ENCOUNTER — Telehealth: Payer: Self-pay

## 2022-01-14 DEATH — deceased
